# Patient Record
Sex: Male | Born: 1960 | Race: White | Smoking: Never smoker
Health system: Northeastern US, Academic
[De-identification: ages and names within clinical notes are randomized; demographics above are authoritative.]

## PROBLEM LIST (undated history)

## (undated) DIAGNOSIS — C801 Malignant (primary) neoplasm, unspecified: Secondary | ICD-10-CM

## (undated) DIAGNOSIS — F419 Anxiety disorder, unspecified: Secondary | ICD-10-CM

## (undated) DIAGNOSIS — K635 Polyp of colon: Secondary | ICD-10-CM

## (undated) DIAGNOSIS — R972 Elevated prostate specific antigen [PSA]: Secondary | ICD-10-CM

## (undated) DIAGNOSIS — M1990 Unspecified osteoarthritis, unspecified site: Secondary | ICD-10-CM

## (undated) HISTORY — DX: Elevated prostate specific antigen (PSA): R97.20

## (undated) HISTORY — DX: Unspecified osteoarthritis, unspecified site: M19.90

## (undated) HISTORY — PX: KNEE SURGERY: SHX244

## (undated) HISTORY — DX: Anxiety disorder, unspecified: F41.9

## (undated) HISTORY — DX: Malignant (primary) neoplasm, unspecified: C80.1

## (undated) HISTORY — PX: UMBILICAL HERNIA REPAIR: SHX196

## (undated) HISTORY — PX: JOINT REPLACEMENT: SHX530

## (undated) HISTORY — PX: INGUINAL HERNIA REPAIR: SHX194

## (undated) HISTORY — PX: PROSTATE SURGERY: SHX751

## (undated) HISTORY — PX: VASECTOMY: SHX75

## (undated) HISTORY — PX: HERNIA REPAIR: SHX51

---

## 2006-12-10 DIAGNOSIS — M1990 Unspecified osteoarthritis, unspecified site: Secondary | ICD-10-CM | POA: Insufficient documentation

## 2006-12-10 DIAGNOSIS — E785 Hyperlipidemia, unspecified: Secondary | ICD-10-CM | POA: Insufficient documentation

## 2007-04-30 ENCOUNTER — Encounter (INDEPENDENT_AMBULATORY_CARE_PROVIDER_SITE_OTHER): Payer: Self-pay | Admitting: *Deleted

## 2007-04-30 ENCOUNTER — Ambulatory Visit (HOSPITAL_COMMUNITY): Admission: RE | Admit: 2007-04-30 | Discharge: 2007-04-30 | Payer: Self-pay | Admitting: *Deleted

## 2008-06-28 ENCOUNTER — Encounter: Payer: Self-pay | Admitting: Cardiology

## 2010-10-24 NOTE — Op Note (Signed)
NAME:  Jeremiah Weaver, Jeremiah Weaver NO.:  [id number]   MEDICAL RECORD NO.:  [id number]          PATIENT TYPE:  AMB   LOCATION:  DAY                          FACILITY:  [facility]   PHYSICIAN:  Alfonse Ras, MD   DATE OF BIRTH:  [birth year]   DATE OF PROCEDURE:  DATE OF DISCHARGE:                               OPERATIVE REPORT   PREOPERATIVE DIAGNOSIS:  Internal hemorrhoids with bleeding and  prolapse.   POSTOPERATIVE DIAGNOSIS:  Internal hemorrhoids with bleeding and  prolapse.   PROCEDURE:  PPH hemorrhoidectomy with rectopexy.   ANESTHESIA:  General.   DESCRIPTION:  The patient was taken to the operating room and placed in  a supine position.  After general anesthesia was induced using  endotracheal tube, he was placed in prone jackknife position.  Perianal  and rectal prep were undertaken.  This was done with Betadine and  saline.  Anal dilatation was accomplished to 3 fingerbreadths.  All  hemorrhoidal bundles were injected with 0.5 Marcaine.  The internal and  external sphincter muscles were also injected with an additional 20 mL  of 0.5 Marcaine.  Anoscope was inserted and a circumferential  pursestring suture was placed 4-5 cm proximal to the dentate line.  A  PPH stapler was then introduced and the pursestring suture was tied down  around the anvil.  The stapler was closed and held in place for 45  seconds.  The stapler was then fired, released, and removed.  Staple  line was inspected.  There was only one area of minimal amount of  bleeding from the mucosa and the right anterior aspect which was  coagulated.  The remainder staple line was intact.  The hemorrhoidal  bundle was identified on the stapler, incised, and removed.  This was  sent for pathologic evaluation.  A Gelfoam packing was placed in the  rectum.  Sterile dressing was applied.  The patient tolerated the  procedure well and went to PACU in good condition.      Alfonse Ras, MD  Electronically  Signed     KRE/MEDQ  D:  04/30/2007  T:  04/30/2007  Job:  [id number]

## 2010-12-12 ENCOUNTER — Encounter: Payer: Self-pay | Admitting: Primary Care

## 2010-12-12 ENCOUNTER — Ambulatory Visit: Payer: Self-pay | Admitting: Primary Care

## 2010-12-12 VITALS — BP 108/70 | HR 60 | Temp 97.3°F | Wt 172.8 lb

## 2010-12-12 DIAGNOSIS — H938X9 Other specified disorders of ear, unspecified ear: Secondary | ICD-10-CM

## 2010-12-12 NOTE — Patient Instructions (Addendum)
Dr. Ether Griffins- [phone]  Debrox

## 2010-12-14 ENCOUNTER — Ambulatory Visit: Payer: Self-pay | Admitting: Otolaryngology

## 2010-12-14 ENCOUNTER — Encounter: Payer: Self-pay | Admitting: Otolaryngology

## 2010-12-14 VITALS — BP 121/70 | HR 59 | Ht 72.0 in | Wt 170.0 lb

## 2010-12-14 DIAGNOSIS — H6120 Impacted cerumen, unspecified ear: Secondary | ICD-10-CM

## 2010-12-15 NOTE — Progress Notes (Signed)
Subjective:       Timothy Cross is a 50 y.o. male who presents with concerns of muffled hearing.  He presents to the office after being referred by his PCP.  He has been using Debrox for the past few days in the left ear.  Denies otalgia, prior episodes, otorrhea.    History:       has no past medical history on file.   has past surgical history that includes knee surgery and Vasectomy.   reports that he has never smoked. He does not have any smokeless tobacco history on file. He reports that he drinks alcohol.  family history includes Cancer in his brother and mother and Conversion in an other family member.    Allergies:   Review of patient's allergies indicates no known allergies.     Medications:     Current Outpatient Prescriptions   Medication Sig   . carbamide peroxide (DEBROX) 6.5 % otic solution 5 drops 2 times daily           Review of Systems:     Hearing loss.  All other ENT ROS otherwise denied.      Objective:      BP 121/70  Pulse 59  Ht 1.829 m (6')  Wt 77.111 kg (170 lb)  BMI 23.06 kg/m2    General:   Normocephalic, well nourished, with appropriate affect in NAD.  A &O x 3.     Head and Face:  The head and face reveal normal facial symmetry without lesions or scars. The salivary glands are palpated and appear normal without tenderness or mass.    EYES:  Non icteric, normal conjunctiva.  EOMI.   Ears: RIGHT EAR:  Ear pinna is normal in appearance with no scars, lesions or masses. Mastoid bone is non tender. The ear canal is clear.  The tympanic membrane is intact without inflammation or perforation. TM is mobile to pneumatic otoscopy without evidence of middle ear effusion. Using 512 Hz tuning fork AC> BC and normal Weber performed on the bony nasal dorsum.     LEFT EAR:  Ear pinna is normal in appearance with no scars, lesions or masses. Mastoid bone is non tender. The ear canal is impacted with cerumen. This was removed.  After removal there was a thin layer of cerumen that coated the TM.  The tympanic membrane is intact without inflammation or perforation. TM is mobile to pneumatic otoscopy without evidence of middle ear effusion. Using 512 Hz tuning fork AC> BC and normal Weber performed on the bony nasal dorsum.   Nose:    External nose is normal. The nasal mucosa is not inflamed. The nasal septum is generally midline and there is no evidence of septal perforation or hematoma. The inferior turbinates are normal without masses or obstructions. No polyps are visualized. The paranasal sinuses are non tender.   Oral:   ORAL CAVITY: The lips, teeth, tongue and buccal mucosa appear normal without lesions or inflammation.  The uvula and soft palate are normal.  The tonsils are non erythematous or swollen.  Oropharynx is without lesion, inflammation or swelling.   Neck:  There is no asymmetry or cervical lymphadenopathy noted in either anterior or posterior neck chains or supraclavicular fossa bilaterally. There are no evident masses, trachea is midline and the thyroid gland reveals no enlargement, tenderness nor mass effect.        Assessment:       50 y.o. male with left cerumen impaction.  Plan:     Cerumen was removed.  Thin layer of cerumen remains on the TM.  He will use Debrox in the ears for one week with follow up.  It has been a pleasure participating in the care of your patient. Thank you for your referral to [facility].  If you have any questions, please feel free to contact me.    Procedure:   Cerumen removal:    The left ear was approached with otoscope, ear speculum, number 3 and 7 suction. All cerumen was removed and the ear canal was thoroughly examined. Attempts to remove thin layer of cerumen off the TM lead to patient guarding. The patient tolerated the procedure well without incident.

## 2010-12-17 NOTE — Progress Notes (Signed)
CHIEF COMPLAINT: Cerumen    HPI: In terms of cerumen impaction ear feels full worse with water.  Going on for a number of weeks.  No pain.  Never followed up on the hematochezia.  Has had none since initially spoken with.  Medications reviewed and reconciled in the computer today.      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        PHYSICAL EXAM: Vitals:  S1-S2, regular rhythm, no S3, no rub,   Lungs clear.  No masses, tenderness, organomegaly, or bruits on abdominal exam.  No calf tenderness or cords.  No edema.  Cerumen in left ear with decreased hearing right TM unremarkable          ASSESS: Cerumen  Hematochezia    PLAN: Colonoscopy  Followup physical  Attempt at removing cerumen after risks benefits reviewed with water pick unsuccessful we'll set up to see ENT      Follow-up:

## 2010-12-21 ENCOUNTER — Encounter: Payer: Self-pay | Admitting: Otolaryngology

## 2011-02-13 ENCOUNTER — Ambulatory Visit
Admit: 2011-02-13 | Discharge: 2011-02-13 | Disposition: A | Discharge status: Home / Self Care | Payer: Self-pay | Source: Ambulatory Visit | Attending: Primary Care | Admitting: Primary Care

## 2011-02-13 LAB — CBC AND DIFFERENTIAL
Baso # K/uL: 0 10*3/uL (ref 0.0–0.1)
Basophil %: 0.5 % (ref 0.2–1.2)
Eos # K/uL: 0.4 10*3/uL (ref 0.0–0.5)
Eosinophil %: 6.6 % (ref 0.8–7.0)
Hematocrit: 42 % (ref 40–51)
Hemoglobin: 13.7 g/dL (ref 13.7–17.5)
Lymph # K/uL: 1.5 10*3/uL (ref 1.3–3.6)
Lymphocyte %: 21.8 % (ref 21.8–53.1)
MCV: 88 fL (ref 79–92)
Mono # K/uL: 0.6 10*3/uL (ref 0.3–0.8)
Monocyte %: 8.7 % (ref 5.3–12.2)
Neut # K/uL: 4.2 10*3/uL (ref 1.8–5.4)
Platelets: 158 10*3/uL (ref 150–330)
RBC: 4.8 MIL/uL (ref 4.6–6.1)
RDW: 13.2 % (ref 11.6–14.4)
Seg Neut %: 62.4 % (ref 34.0–67.9)
WBC: 6.7 10*3/uL (ref 4.2–9.1)

## 2011-02-13 LAB — BASIC METABOLIC PANEL
Anion Gap: 8 (ref 7–16)
CO2: 27 mmol/L (ref 20–28)
Calcium: 8.8 mg/dL (ref 8.6–10.2)
Chloride: 103 mmol/L (ref 96–108)
Creatinine: 1.02 mg/dL (ref 0.67–1.17)
GFR,Black: >59 *
GFR,Caucasian: >59 *
Glucose: 92 mg/dL (ref 60–99)
Lab: 19 mg/dL (ref 6–20)
Potassium: 3.9 mmol/L (ref 3.3–5.1)
Sodium: 138 mmol/L (ref 133–145)

## 2011-02-13 LAB — LIPID PANEL
Chol/HDL Ratio: 2.7
Cholesterol: 180 mg/dL
HDL: 66 mg/dL
LDL Calculated: 100 mg/dL
Non HDL Cholesterol: 114 mg/dL
Triglycerides: 68 mg/dL

## 2011-02-13 LAB — PSA (EFF.4-2010): PSA (eff. 4-2010): 0.9 ng/mL (ref 0.00–4.00)

## 2011-02-27 ENCOUNTER — Encounter: Payer: Self-pay | Admitting: Gastroenterology

## 2011-02-27 ENCOUNTER — Ambulatory Visit: Payer: Self-pay | Admitting: Primary Care

## 2011-02-27 ENCOUNTER — Encounter: Payer: Self-pay | Admitting: Primary Care

## 2011-02-27 VITALS — BP 120/64 | HR 60 | Ht 73.5 in | Wt 174.0 lb

## 2011-02-27 DIAGNOSIS — E785 Hyperlipidemia, unspecified: Secondary | ICD-10-CM

## 2011-02-27 NOTE — Patient Instructions (Signed)
Dr. Mcmeekin- [phone]

## 2011-02-27 NOTE — H&P (Addendum)
Chief complaint: Elevated cholesterol    HPI: In terms of cholesterol elevation and hypertension there is no chest pain or shortness of breath.  No significant change in weight.  Not exercising regularly.  Trying to watch diet.  Has been going on for greater than a year.  Not getting better.  It is of moderate severity.  Most recent blood work reviewed.  Dysplastic nevus and is unsure if it changed  Past medical history: Allergies ACL tear right knee    Family history: Diabetes no     , hypertension no     , coronary artery disease    no     , cancer yes pancreatic thyroid:    Social history: Married, working, does not exercise regularly, occasionally watches diet, does not smoke, does not drink to excess, does not do drugs, never did intravenous drugs, is in a monogamous relationship, sees the dentist regularly, and wears seatbelts.    Review of systems: No chest pain or dyspnea, no blood or black stools, no urinary symptoms that are new, no stroke or seizure, no constitutional symptoms, no polyuria or polydipsia, no problems with the blood or lymph glands, no significantly new problems with the eyes ears nose throat or skin, no new orthopedic problems, psychologically stable. Medications reviewed and reconciled in computer today.    Physical exam: Weight: As recorded                   Blood pressure: As recorded  Murmur  no        regular rhythm  S1-S2 no S3 no rub PMI nondisplaced.  Jugular venous pressure approximately 7 cm.  No new edema , no calf tenderness, or cords.  Lungs clear to auscultation.  No masses, tenderness, organomegaly or bruits and normal bowel sounds.  Cranial nerves, pronator drift, finger-to-nose, light touch, reflexes all equal normal bilaterally.  Acuity, extraocular movements light reflex, ophthalmologic exam all equal and unchanged from previous and without significant abnormality.  Hearing equal bilaterally TMs normal and canals unremarkable.  Nose unremarkable lymph gland cervical  axillary supraclavicular groin all unremarkable.  Skin unremarkable, feet unremarkable and no hernias.  Pulses equal.  No significant bruits.  Few dysplastic nevi and abdomen  thyroid normal. Mouth and throat unremarkable.  ZOX:WRUEAVWUJ and scrotum were unremarkable, there were no hernias, prostate was normal for age without any nodules suggestive of cancer, rectal was unremarkable. Breasts unremarkable.      EKG was reviewed and was without significant change from prior.    ASSESS: Elevated cholesterol  Dysplastic nevus    PLAN: Health care proxy given now  Ophthalmologist  Colonoscopy scheduled for next month per his choice  He will get it done that bleeding has stopped  Reviewed PSA and the patient wishes to continue it  Patient will call with the type of thyroid cancer his family has had  Testicular self-exam known    warnings on  medications  diet, weight loss and exercise encouraged  check blood work prior to next office visit  follow up later next year with blood work  Dermatology

## 2011-03-20 LAB — HEMOGLOBIN AND HEMATOCRIT, BLOOD: HCT: 44.5

## 2011-04-11 ENCOUNTER — Encounter: Payer: Self-pay | Admitting: Gastroenterology

## 2011-04-11 ENCOUNTER — Ambulatory Visit
Admit: 2011-04-11 | Discharge: 2011-04-11 | Disposition: A | Discharge status: Home / Self Care | Payer: Self-pay | Source: Ambulatory Visit | Attending: Gastroenterology | Admitting: Gastroenterology

## 2011-04-13 LAB — SURGICAL PATHOLOGY

## 2011-04-16 ENCOUNTER — Encounter: Payer: Self-pay | Admitting: Gastroenterology

## 2011-05-29 ENCOUNTER — Telehealth: Payer: Self-pay | Admitting: Primary Care

## 2011-05-29 ENCOUNTER — Encounter: Payer: Self-pay | Admitting: Primary Care

## 2011-05-29 ENCOUNTER — Ambulatory Visit: Payer: Self-pay | Admitting: Primary Care

## 2011-05-29 VITALS — BP 124/78 | HR 72 | Ht 73.0 in | Wt 173.0 lb

## 2011-05-29 DIAGNOSIS — R52 Pain, unspecified: Secondary | ICD-10-CM

## 2011-05-29 DIAGNOSIS — M79651 Pain in right thigh: Secondary | ICD-10-CM

## 2011-05-29 DIAGNOSIS — M25551 Pain in right hip: Secondary | ICD-10-CM

## 2011-06-07 ENCOUNTER — Ambulatory Visit: Payer: Self-pay | Admitting: Orthopedic Surgery

## 2011-11-27 ENCOUNTER — Other Ambulatory Visit: Payer: Self-pay | Admitting: Family Medicine

## 2012-02-13 ENCOUNTER — Other Ambulatory Visit: Payer: Self-pay | Admitting: Gastroenterology

## 2012-02-28 ENCOUNTER — Encounter: Payer: Self-pay | Admitting: Primary Care

## 2012-02-28 ENCOUNTER — Telehealth: Payer: Self-pay | Admitting: Primary Care

## 2012-02-28 ENCOUNTER — Encounter: Payer: Self-pay | Admitting: Gastroenterology

## 2012-02-28 ENCOUNTER — Ambulatory Visit: Payer: Self-pay | Admitting: Primary Care

## 2012-02-28 VITALS — BP 126/64 | HR 68 | Ht 72.5 in | Wt 170.0 lb

## 2012-02-28 DIAGNOSIS — Z0000 Encounter for general adult medical examination without abnormal findings: Secondary | ICD-10-CM

## 2012-02-28 DIAGNOSIS — E785 Hyperlipidemia, unspecified: Secondary | ICD-10-CM

## 2012-02-28 DIAGNOSIS — M25551 Pain in right hip: Secondary | ICD-10-CM

## 2012-02-28 DIAGNOSIS — Z23 Encounter for immunization: Secondary | ICD-10-CM

## 2012-02-28 NOTE — Telephone Encounter (Signed)
Please let pt know xray unchanged from previous which is good

## 2012-02-28 NOTE — H&P (Signed)
Chief complaint:hyperchol    ZOX:WRUEAVWUJ  Years, mild severity doing well        Hip pain got better. See prior.        Most recent notes,recent imaging,recent consults(media),recent bw reviewed   Vitals reviewed, medications reviewed and reconciled,problem list reviewed,allergies reviewed     Past medical history: acl tear right knee, allergies, dysplastic nevi chol elevation        Family history: Diabetes no       hypertension no        coronary artery disease no           cancer thyroid cancer, leukemia, grandfather pancreatic cancer  Father with non melanoma skin cancer  Social history:  Married or with a significant other, working, does not exercise regularly, occasionally watches diet, does not smoke, does not drink to excess, does not do drugs, never did intravenous drugs, is  in a monogamous relationship, sees the dentist regularly, and wears seatbelts.    Review of systems: No chest pain or dyspnea, no blood or black stools, no urinary symptoms that are new, no stroke or seizure, no constitutional symptoms, no polyuria or polydipsia, no problems with the blood or lymph glands, no significantly new problems with the eyes ears nose throat or skin, no new orthopedic problems, psychologically stable. Medications reviewed and reconciled in computer today.  Most recent blood work reviewed   Physical exam: Weight:   Vitals including blood pressure as recorded   Murmur          regular rhythm  S1-S2 no S3 no rub PMI nondisplaced.  Jugular venous pressure approximately 7 cm.  No new edema , no calf tenderness, or cords.  Lungs clear to auscultation.  No masses, tenderness, organomegaly or bruits and normal bowel sounds.  Cranial nerves, pronator drift, finger-to-nose, light touch, reflexes all equal normal bilaterally.  Acuity, extraocular movements light reflex, ophthalmologic exam all equal and unchanged from previous and without significant abnormality.  Hearing equal bilaterally TMs normal and canals  unremarkable.  Nose unremarkable lymph gland cervical axillary supraclavicular groin all unremarkable.  Skin unremarkable, feet unremarkable and no hernias.  Pulses equal.  No significant bruits.  thyroid normal. Mouth and throat unremarkable.   WJX:[id number] and scrotum were unremarkable, there were no hernias, prostate was normal for age without any nodules suggestive of cancer, rectal was unremarkable. Breasts unremarkable. Decreased rom of right hip no pain            EKG was reviewed and was without significant change from prior.    ASSESS:hyperchol        PLAN:Medications were reviewed and no changes were made      Risks of new meds reviewed     TSE Known    Follow up later this year with blood work prior   Continue to work on correct diet, optimal weight and exercise             Dermatology never saw, no changing moles  Eyes the same          Mother and brother with different thyroid cancer   Repeat colonoscopy in 03/2016        Flu shot today       bw including psa  Never went to ortho hip feels better  Discussed remeron versus lorazepam loss of both of his children wife with carcinoid tumor  Re xray right hip, pt never went to ortho as directed previously

## 2012-02-29 NOTE — Telephone Encounter (Signed)
Spoke with pt

## 2012-02-29 NOTE — Telephone Encounter (Signed)
See below

## 2012-02-29 NOTE — Telephone Encounter (Signed)
I called and left a message for the patient to call our office back.

## 2012-03-14 ENCOUNTER — Ambulatory Visit
Admit: 2012-03-14 | Discharge: 2012-03-14 | Disposition: A | Discharge status: Home / Self Care | Payer: Self-pay | Source: Ambulatory Visit | Attending: Primary Care | Admitting: Primary Care

## 2012-03-14 DIAGNOSIS — Z0000 Encounter for general adult medical examination without abnormal findings: Secondary | ICD-10-CM

## 2012-03-14 LAB — BASIC METABOLIC PANEL
Anion Gap: 10 (ref 7–16)
CO2: 28 mmol/L (ref 20–28)
Calcium: 8.9 mg/dL (ref 8.6–10.2)
Chloride: 104 mmol/L (ref 96–108)
Creatinine: 1.05 mg/dL (ref 0.67–1.17)
GFR,Black: 94 *
GFR,Caucasian: 82 *
Glucose: 90 mg/dL (ref 60–99)
Lab: 20 mg/dL (ref 6–20)
Potassium: 3.6 mmol/L (ref 3.3–5.1)
Sodium: 142 mmol/L (ref 133–145)

## 2012-03-14 LAB — CBC AND DIFFERENTIAL
Baso # K/uL: 0.1 10*3/uL (ref 0.0–0.1)
Basophil %: 0.9 % (ref 0.2–1.2)
Eos # K/uL: 0.5 10*3/uL (ref 0.0–0.5)
Eosinophil %: 6.6 % (ref 0.8–7.0)
Hematocrit: 44 % (ref 40–51)
Hemoglobin: 15.1 g/dL (ref 13.7–17.5)
Lymph # K/uL: 2 10*3/uL (ref 1.3–3.6)
Lymphocyte %: 28.4 % (ref 21.8–53.1)
MCV: 86 fL (ref 79–92)
Mono # K/uL: 0.5 10*3/uL (ref 0.3–0.8)
Monocyte %: 7.6 % (ref 5.3–12.2)
Neut # K/uL: 3.9 10*3/uL (ref 1.8–5.4)
Platelets: 173 10*3/uL (ref 150–330)
RBC: 5.1 MIL/uL (ref 4.6–6.1)
RDW: 12.8 % (ref 11.6–14.4)
Seg Neut %: 56.5 % (ref 34.0–67.9)
WBC: 7 10*3/uL (ref 4.2–9.1)

## 2012-03-14 LAB — LIPID PANEL
Chol/HDL Ratio: 2.8
Cholesterol: 193 mg/dL
HDL: 70 mg/dL
LDL Calculated: 109 mg/dL
Non HDL Cholesterol: 123 mg/dL
Triglycerides: 70 mg/dL

## 2012-03-14 LAB — PSA (EFF.4-2010): PSA (eff. 4-2010): 1.28 ng/mL (ref 0.00–4.00)

## 2012-09-16 ENCOUNTER — Ambulatory Visit: Payer: Self-pay | Admitting: Primary Care

## 2012-09-16 ENCOUNTER — Encounter: Payer: Self-pay | Admitting: Primary Care

## 2012-09-16 VITALS — BP 122/70 | HR 56 | Ht 72.0 in | Wt 174.6 lb

## 2012-09-16 DIAGNOSIS — M5410 Radiculopathy, site unspecified: Secondary | ICD-10-CM

## 2012-09-16 NOTE — Progress Notes (Signed)
CHIEF COMPLAINT:radiculopathy    HPI: radiculopathy  Going on for couple weeks buttocks and down back of leg  Course getting better? Waxes and wanes, worst last few days,   Associated symtoms no weakness or numbness  Moderate severity  Felt like someone was pushing with a dull knife off and on  Discomfort and tiongling      Took a few ibuprofen last night, woke up felt better, just light discomfort,  No weakness or numbness in leg, does have lower back pain.      Working out and not brought on by that        Most recent notes,recent imaging,recent consults(media),recent bw reviewed   Vitals reviewed, medications reviewed and reconciled,problem list reviewed,allergies reviewed       PAST MEDICAL HISTORY:osteoarthritis      FAMILY HISTORY: cancer        SOCIAL HISTORY:married or with a significant other                                  not Working                                 Does not smoke                                 Does not drink to excess or do drugs    ROS: no significant change in weight            No excessive fatigue            No new urinary symptoms            No new GI symptoms                     PHYSICAL EXAM: Vitals: including blood pressure as recorded  S1-S2, regular rhythm, no S3, no rub,   Lungs clear.  No masses, tenderness, organomegaly, or bruits on abdominal exam.   no calf tenderness or cords.  No edema. Strength reflexes light touch all equal and normal bilaterally except decreased ankle reflex on left compared to right        ASSESS:radiculopathy                        PLAN:Medications were reviewed and  changes were made  See below  Risks of new medication reviewed   .  Follow up later this year with blood work prior  Continue to work on correct diet, optimal weight and exercise    Follow up in a few weeks to recheck ankle reflex           Aleve 2  Bid prilosec once  About a week, if not significantly better or if weakness or numbness of loss of urine or stool, xray, physical  therapy,

## 2012-09-16 NOTE — Patient Instructions (Signed)
Yoga tape if better    Aleve 2  Twice a day,  prilosec once a day,  About a week, if not significantly better or if weakness or numbness of loss of urine or stool, call, any questions call

## 2012-10-07 ENCOUNTER — Encounter: Payer: Self-pay | Admitting: Primary Care

## 2012-10-07 ENCOUNTER — Ambulatory Visit: Payer: Self-pay | Admitting: Primary Care

## 2012-10-07 VITALS — BP 112/60 | HR 60 | Ht 72.0 in | Wt 172.6 lb

## 2012-10-07 DIAGNOSIS — E785 Hyperlipidemia, unspecified: Secondary | ICD-10-CM

## 2012-10-07 DIAGNOSIS — Z0000 Encounter for general adult medical examination without abnormal findings: Secondary | ICD-10-CM

## 2012-10-07 DIAGNOSIS — Z1159 Encounter for screening for other viral diseases: Secondary | ICD-10-CM

## 2012-10-07 DIAGNOSIS — Z125 Encounter for screening for malignant neoplasm of prostate: Secondary | ICD-10-CM

## 2012-10-07 NOTE — Progress Notes (Signed)
CHIEF COMPLAINT:radicular pain    ZOX:WRUEAVWUJ paindoing significantly better, was moderate, no weakness or numbness        Off aleve off prilosec          Most recent notes,recent imaging,recent consults(media),recent bw reviewed   Vitals reviewed, medications reviewed and reconciled,problem list reviewed,allergies reviewed       PAST MEDICAL HISTORY:borderline elevated chol      FAMILY HISTORY: cancer        SOCIAL HISTORY:married or with a significant other                                   Working                                 Does not smoke                                 Does not drink to excess or do drugs    ROS: no significant change in weight            No excessive fatigue            No new urinary symptoms            No new GI symptoms                     PHYSICAL EXAM: Vitals: including blood pressure as recorded  S1-S2, regular rhythm, no S3, no rub,   Lungs clear.  No masses, tenderness, organomegaly, or bruits on abdominal exam.   no calf tenderness or cords.  No edema.strength reflexes light touch, normal lower extremity bilaterally        ASSESS:radiculopathy                        PLAN:Medications were reviewed and no changes were made      Follow up later this year with blood work prior  Continue to work on correct diet, optimal weight and exercise    Do Yoga dvd, call if not better  Pt wishes to hold on xray    Hep c and psa screening

## 2013-02-20 ENCOUNTER — Telehealth: Payer: Self-pay | Admitting: Primary Care

## 2013-02-20 NOTE — Telephone Encounter (Signed)
Pt stopped in and said that he will be going on a work trip to [place] soon and would like to know what immunizations to get. Per our conversation he is to go to [facility] with his itinerary. Pt was given a list of his immunizations that he has gotten here.

## 2013-02-25 ENCOUNTER — Telehealth: Payer: Self-pay

## 2013-02-26 ENCOUNTER — Ambulatory Visit
Admit: 2013-02-26 | Discharge: 2013-02-26 | Disposition: A | Discharge status: Home / Self Care | Payer: Self-pay | Source: Ambulatory Visit | Attending: Primary Care | Admitting: Primary Care

## 2013-02-26 DIAGNOSIS — Z0000 Encounter for general adult medical examination without abnormal findings: Secondary | ICD-10-CM

## 2013-02-26 DIAGNOSIS — Z125 Encounter for screening for malignant neoplasm of prostate: Secondary | ICD-10-CM

## 2013-02-26 DIAGNOSIS — Z1159 Encounter for screening for other viral diseases: Secondary | ICD-10-CM

## 2013-02-26 DIAGNOSIS — E785 Hyperlipidemia, unspecified: Secondary | ICD-10-CM

## 2013-02-26 LAB — LIPID PANEL
Chol/HDL Ratio: 2.9
Cholesterol: 201 mg/dL — AB
HDL: 70 mg/dL
LDL Calculated: 118 mg/dL
Non HDL Cholesterol: 131 mg/dL
Triglycerides: 64 mg/dL

## 2013-02-26 LAB — CBC AND DIFFERENTIAL
Baso # K/uL: 0 10*3/uL (ref 0.0–0.1)
Basophil %: 0.6 % (ref 0.2–1.2)
Eos # K/uL: 0.5 10*3/uL (ref 0.0–0.5)
Eosinophil %: 9.7 % — ABNORMAL HIGH (ref 0.8–7.0)
Hematocrit: 45 % (ref 40–51)
Hemoglobin: 15 g/dL (ref 13.7–17.5)
Lymph # K/uL: 1.3 10*3/uL (ref 1.3–3.6)
Lymphocyte %: 24.7 % (ref 21.8–53.1)
MCV: 87 fL (ref 79–92)
Mono # K/uL: 0.6 10*3/uL (ref 0.3–0.8)
Monocyte %: 10.3 % (ref 5.3–12.2)
Neut # K/uL: 2.9 10*3/uL (ref 1.8–5.4)
Platelets: 175 10*3/uL (ref 150–330)
RBC: 5.1 MIL/uL (ref 4.6–6.1)
RDW: 13 % (ref 11.6–14.4)
Seg Neut %: 54.7 % (ref 34.0–67.9)
WBC: 5.3 10*3/uL (ref 4.2–9.1)

## 2013-02-26 LAB — BASIC METABOLIC PANEL
Anion Gap: 8 (ref 7–16)
CO2: 31 mmol/L — ABNORMAL HIGH (ref 20–28)
Calcium: 9 mg/dL (ref 8.6–10.2)
Chloride: 102 mmol/L (ref 96–108)
Creatinine: 1.11 mg/dL (ref 0.67–1.17)
GFR,Black: 88 *
GFR,Caucasian: 76 *
Glucose: 99 mg/dL (ref 60–99)
Lab: 18 mg/dL (ref 6–20)
Potassium: 3.9 mmol/L (ref 3.3–5.1)
Sodium: 141 mmol/L (ref 133–145)

## 2013-02-26 LAB — PSA (EFF.4-2010): PSA (eff. 4-2010): 1.41 ng/mL (ref 0.00–4.00)

## 2013-02-27 ENCOUNTER — Encounter: Payer: Self-pay | Admitting: Primary Care

## 2013-02-27 ENCOUNTER — Ambulatory Visit: Payer: Self-pay | Admitting: Primary Care

## 2013-02-27 ENCOUNTER — Telehealth: Payer: Self-pay | Admitting: Primary Care

## 2013-02-27 VITALS — BP 120/64 | HR 68 | Ht 72.0 in | Wt 174.6 lb

## 2013-02-27 DIAGNOSIS — Z86010 Personal history of colonic polyps: Secondary | ICD-10-CM

## 2013-02-27 DIAGNOSIS — Z125 Encounter for screening for malignant neoplasm of prostate: Secondary | ICD-10-CM

## 2013-02-27 DIAGNOSIS — M1990 Unspecified osteoarthritis, unspecified site: Secondary | ICD-10-CM

## 2013-02-27 DIAGNOSIS — E785 Hyperlipidemia, unspecified: Secondary | ICD-10-CM

## 2013-02-27 DIAGNOSIS — S83519A Sprain of anterior cruciate ligament of unspecified knee, initial encounter: Secondary | ICD-10-CM

## 2013-02-27 LAB — HEPATITIS C ANTIBODY: Hep C Ab: NEGATIVE

## 2013-02-27 MED ORDER — AMOXICILLIN-POT CLAVULANATE 875-125 MG PO TABS *I*
1.0000 | ORAL_TABLET | Freq: Two times a day (BID) | ORAL | Status: DC
Start: 2013-02-27 — End: 2014-03-02

## 2013-02-27 NOTE — Telephone Encounter (Signed)
Detailed message was left for pt. Asked pt to call back on Monday to confirm he received message.

## 2013-02-27 NOTE — Telephone Encounter (Signed)
Please let pt know mild arthritis in his spine as expected, large amount in the knee as expected and to follow through on the things we spoke about

## 2013-02-27 NOTE — H&P (Signed)
Chief complaint:hyperchol    GNF:[id number] duration for greater than a year  Severity is moderate  Course is staying the same, not worsening  Associated no recent chf or known cardiomyopathy    Elevated cholesterol duration for greater than a year  Severity is moderate not improving  Course is staying the same  Associated no recent heart attack or angina      Has had cartilage repair of right knee as well as acl repair and loose body body removal  Hip doing about the same      Radiculopathy doing significantly better          Most recent notes,recent imaging,recent consults(media),recent bw reviewed   Vitals reviewed, medications reviewed and reconciled,problem list reviewed,allergies reviewed     Past medical history: acl tear right knee, hip pain, back pain,colonic polyps,hyperlipidemia        Family history: Diabetes no       hypertension no        coronary artery disease no           cancer brother leukemia, mother and brother with different thyroid cancers, pancreatic cancer    Social history:  Married or with a significant other, working, does not exercise regularly, occasionally watches diet, does not smoke, does not drink to excess, does not do drugs, never did intravenous drugs, is  in a monogamous relationship, sees the dentist regularly, and wears seatbelts.    Review of systems: No chest pain or dyspnea, no blood or black stools, no urinary symptoms that are new, no stroke or seizure, no constitutional symptoms, no polyuria or polydipsia, no problems with the blood or lymph glands, no significantly new problems with the eyes ears nose throat or skin, no new orthopedic problems, psychologically stable. Medications reviewed and reconciled in computer today.  Most recent blood work reviewed   Physical exam: Weight:   Vitals including blood pressure as recorded   Murmur   no       regular rhythm  S1-S2 no S3 no rub PMI nondisplaced.  Jugular venous pressure approximately 7 cm.  No new edema , no calf  tenderness, or cords.  Lungs clear to auscultation.  No masses, tenderness, organomegaly or bruits and normal bowel sounds.  Cranial nerves, pronator drift, finger-to-nose, light touch, reflexes all equal normal bilaterally.  Acuity, extraocular movements light reflex, ophthalmologic exam all equal and unchanged from previous and without significant abnormality.  Hearing equal bilaterally TMs normal and canals unremarkable.  Nose unremarkable lymph gland cervical axillary supraclavicular groin all unremarkable.  Skin unremarkable, feet unremarkable and no hernias.  Pulses equal.  No significant bruits.  thyroid normal. Mouth and throat unremarkable.   NGE:[id number] and scrotum were unremarkable, there were no hernias, prostate was normal for age without any nodules suggestive of cancer, rectal was unremarkable. Breasts unremarkable.             EKG was reviewed and was without significant change from prior.    ASSESS:hyperchol        PLAN:Medications were reviewed and no changes were made    TSE Known    Follow up later this year with blood work prior   Continue to work on correct diet, optimal weight and exercise               ls spine  And do dvd right knee xray    2.7 percent  Risk , hold on meds    Already got flu shot  Going to [place],  passport   Colonoscopy 03/2014    Xray  Spine since occasional back discomfort  Await fax from [facility] on the shots  augmentin if URI when away but he will call firtst and got meds for diarrhea from [facility]  Current Outpatient Prescriptions   Medication Sig   . amoxicillin-clavulanate (AUGMENTIN) 875-125 MG per tablet Take 1 tablet by mouth 2 times daily     No current facility-administered medications for this visit.

## 2014-03-02 ENCOUNTER — Ambulatory Visit: Payer: Self-pay | Admitting: Primary Care

## 2014-03-02 ENCOUNTER — Telehealth: Payer: Self-pay | Admitting: Primary Care

## 2014-03-02 ENCOUNTER — Encounter: Payer: Self-pay | Admitting: Primary Care

## 2014-03-02 VITALS — BP 132/68 | HR 68 | Ht 72.0 in | Wt 180.6 lb

## 2014-03-02 DIAGNOSIS — Z125 Encounter for screening for malignant neoplasm of prostate: Secondary | ICD-10-CM

## 2014-03-02 DIAGNOSIS — Z0000 Encounter for general adult medical examination without abnormal findings: Secondary | ICD-10-CM

## 2014-03-02 DIAGNOSIS — N429 Disorder of prostate, unspecified: Secondary | ICD-10-CM

## 2014-03-02 DIAGNOSIS — E7800 Pure hypercholesterolemia, unspecified: Secondary | ICD-10-CM

## 2014-03-02 NOTE — Telephone Encounter (Signed)
Received and entered

## 2014-03-02 NOTE — Progress Notes (Signed)
Chief complaint:hyperchol    HPI:  Elevated cholesterol duration for greater than a year  Severity is moderate not improving  Course is staying the same  Associated no recent periph vascular disease or stroke                Arthritis hip and knee, acl tear over twenty years ago, meniscus on right and acl, 15 years ago left leg meniscus tear  Back doing well, was doing yoga tape and helped    Most recent notes,recent imaging,recent consults(media),recent bw reviewed   Vitals reviewed, medications reviewed and reconciled,problem list reviewed,allergies reviewed     Past medical history: colonic polyps,osteoarthritisw, hyperlipidemia, acl tear and meniscal tear        Family history: Diabetes no       hypertension no        coronary artery disease no           cancer mother colonic polyps, brother mother thyroid surgery leukemia, pancreatic cancer    Social history:  Married or with a significant other, working, does not exercise regularly, occasionally watches diet, does not smoke, does not drink to excess, does not do drugs, never did intravenous drugs, is  in a monogamous relationship, sees the dentist regularly, and wears seatbelts.    Review of systems: No chest pain or dyspnea, no blood or black stools, no urinary symptoms that are new, no stroke or seizure, no constitutional symptoms, no polyuria or polydipsia, no problems with the blood or lymph glands, no significantly new problems with the eyes ears nose throat or skin, no new orthopedic problems, psychologically stable. Medications reviewed and reconciled in computer today.  Most recent blood work reviewed   Physical exam: Weight:   Vitals including blood pressure as recorded   Murmur   no       regular rhythm  S1-S2 no S3 no rub PMI nondisplaced.  Jugular venous pressure approximately 7 cm.  No new edema , no calf tenderness, or cords.  Lungs clear to auscultation.  No masses, tenderness, organomegaly or bruits and normal bowel sounds.  Cranial nerves,  pronator drift, finger-to-nose, light touch, reflexes all equal normal bilaterally.  Acuity, extraocular movements light reflex, ophthalmologic exam all equal and unchanged from previous and without significant abnormality.  Hearing equal bilaterally TMs normal and canals unremarkable.  Nose unremarkable lymph gland cervical axillary supraclavicular groin all unremarkable.  Skin unremarkable, feet unremarkable and no hernias.  Pulses equal.  No significant bruits.  thyroid normal. Mouth and throat unremarkable.   MWU:XLKGMWNUU and scrotum were unremarkable, there were no hernias, prostate with question nodules on the right irregular on right compared to left , rectal was unremarkable. Breasts unremarkable.             EKG was reviewed and was without significant change from prior.    ASSESS:hyperchol  Irregular prostate        PLAN:Medications were reviewed and no changes were made        Risks of new meds reviewed     TSE Known    Follow up this year with blood work prior   Continue to work on correct diet, optimal weight and exercise           White area in back of throat consistent with cyst to ent  bw including psa,per prior discussion    White in throat, just noticed, sometimes gets a feeling in throat  Colonoscopy 10/15     Flu shot at work  Carcinoid tumor wife       Cyst on back comes and goes,no pain or itching    To plastics if enlarges or sooner if pt wishes    To Urology after PSA test    No current outpatient prescriptions on file.     No current facility-administered medications for this visit.

## 2014-03-02 NOTE — Telephone Encounter (Signed)
Please call [facility] and get all immunization records given at that facility and enter into chart

## 2014-03-02 NOTE — H&P (Signed)
Chief complaint:hyperchol    HPI:  Elevated cholesterol duration for greater than a year  Severity is moderate not improving  Course is staying the same  Associated no recent periph vascular disease or stroke                Arthritis hip and knee, acl tear over twenty years ago, meniscus on right and acl, 15 years ago left leg meniscus tear  Back doing well, was doing yoga tape and helped    Most recent notes,recent imaging,recent consults(media),recent bw reviewed   Vitals reviewed, medications reviewed and reconciled,problem list reviewed,allergies reviewed     Past medical history: colonic polyps,osteoarthritisw, hyperlipidemia, acl tear and meniscal tear        Family history: Diabetes no       hypertension no        coronary artery disease no           cancer mother colonic polyps, brother mother thyroid surgery leukemia, pancreatic cancer    Social history:  Married or with a significant other, working, does not exercise regularly, occasionally watches diet, does not smoke, does not drink to excess, does not do drugs, never did intravenous drugs, is  in a monogamous relationship, sees the dentist regularly, and wears seatbelts.    Review of systems: No chest pain or dyspnea, no blood or black stools, no urinary symptoms that are new, no stroke or seizure, no constitutional symptoms, no polyuria or polydipsia, no problems with the blood or lymph glands, no significantly new problems with the eyes ears nose throat or skin, no new orthopedic problems, psychologically stable. Medications reviewed and reconciled in computer today.  Most recent blood work reviewed   Physical exam: Weight:   Vitals including blood pressure as recorded   Murmur   no       regular rhythm  S1-S2 no S3 no rub PMI nondisplaced.  Jugular venous pressure approximately 7 cm.  No new edema , no calf tenderness, or cords.  Lungs clear to auscultation.  No masses, tenderness, organomegaly or bruits and normal bowel sounds.  Cranial nerves,  pronator drift, finger-to-nose, light touch, reflexes all equal normal bilaterally.  Acuity, extraocular movements light reflex, ophthalmologic exam all equal and unchanged from previous and without significant abnormality.  Hearing equal bilaterally TMs normal and canals unremarkable.  Nose unremarkable lymph gland cervical axillary supraclavicular groin all unremarkable.  Skin unremarkable, feet unremarkable and no hernias.  Pulses equal.  No significant bruits.  thyroid normal. Mouth and throat unremarkable.   AVW:UJWJXBJYN and scrotum were unremarkable, there were no hernias, prostate with question nodules on the right irregular on right compared to left , rectal was unremarkable. Breasts unremarkable.             EKG was reviewed and was without significant change from prior.    ASSESS:hyperchol  Irregular prostate        PLAN:Medications were reviewed and no changes were made        Risks of new meds reviewed     TSE Known    Follow up this year with blood work prior   Continue to work on correct diet, optimal weight and exercise           White area in back of throat consistent with cyst to ent  bw including psa,per prior discussion    White in throat, just noticed, sometimes gets a feeling in throat  Colonoscopy 10/15     Flu shot at work  Carcinoid tumor wife       Cyst on back comes and goes,no pain or itching    To plastics if enlarges or sooner if pt wishes    To Urology after PSA test    No current outpatient prescriptions on file.     No current facility-administered medications for this visit.

## 2014-03-02 NOTE — Patient Instructions (Addendum)
Blood test next week  Urologist after that-[phone]- 10/1 at 4:30- [facility] 2nd [facility]  [phone]  Send me the types of thyroid diseases your mother and brother had  Phone plastic surgeon-[phone]  Dr. Norton Pastel- colonoscopy- [phone]- this year- consult- 9/25 at 12:45

## 2014-03-03 ENCOUNTER — Encounter: Payer: Self-pay | Admitting: Primary Care

## 2014-03-08 ENCOUNTER — Ambulatory Visit
Admit: 2014-03-08 | Discharge: 2014-03-08 | Disposition: A | Discharge status: Home / Self Care | Payer: Self-pay | Source: Ambulatory Visit | Attending: Primary Care | Admitting: Primary Care

## 2014-03-08 ENCOUNTER — Telehealth: Payer: Self-pay | Admitting: Primary Care

## 2014-03-08 DIAGNOSIS — E785 Hyperlipidemia, unspecified: Secondary | ICD-10-CM

## 2014-03-08 DIAGNOSIS — N429 Disorder of prostate, unspecified: Secondary | ICD-10-CM

## 2014-03-08 DIAGNOSIS — Z125 Encounter for screening for malignant neoplasm of prostate: Secondary | ICD-10-CM

## 2014-03-08 LAB — CBC AND DIFFERENTIAL
Baso # K/uL: 0.1 10*3/uL (ref 0.0–0.1)
Basophil %: 1.4 %
Eos # K/uL: 0.5 10*3/uL (ref 0.0–0.5)
Eosinophil %: 9.1 %
Hematocrit: 45 % (ref 40–51)
Hemoglobin: 14.3 g/dL (ref 13.7–17.5)
IMM Granulocytes #: 0 10*3/uL (ref 0.0–0.1)
IMM Granulocytes: 0.3 %
Lymph # K/uL: 1.8 10*3/uL (ref 1.3–3.6)
Lymphocyte %: 31.4 %
MCH: 28 pg/cell (ref 26–32)
MCHC: 32 g/dL (ref 32–37)
MCV: 88 fL (ref 79–92)
Mono # K/uL: 0.5 10*3/uL (ref 0.3–0.8)
Monocyte %: 8.6 %
Neut # K/uL: 2.9 10*3/uL (ref 1.8–5.4)
Nucl RBC # K/uL: 0 10*3/uL (ref 0.0–0.0)
Nucl RBC %: 0 /100 WBC (ref 0.0–0.2)
Platelets: 187 10*3/uL (ref 150–330)
RBC: 5.1 MIL/uL (ref 4.6–6.1)
RDW: 12.7 % (ref 11.6–14.4)
Seg Neut %: 49.2 %
WBC: 5.8 10*3/uL (ref 4.2–9.1)

## 2014-03-08 LAB — BASIC METABOLIC PANEL
Anion Gap: 16 (ref 7–16)
CO2: 24 mmol/L (ref 20–28)
Calcium: 9.1 mg/dL (ref 8.6–10.2)
Chloride: 104 mmol/L (ref 96–108)
Creatinine: 1.11 mg/dL (ref 0.67–1.17)
GFR,Black: 87 *
GFR,Caucasian: 75 *
Glucose: 92 mg/dL (ref 60–99)
Lab: 19 mg/dL (ref 6–20)
Potassium: 3.7 mmol/L (ref 3.3–5.1)
Sodium: 144 mmol/L (ref 133–145)

## 2014-03-08 LAB — LIPID PANEL
Chol/HDL Ratio: 3
Cholesterol: 192 mg/dL
HDL: 65 mg/dL
LDL Calculated: 110 mg/dL
Non HDL Cholesterol: 127 mg/dL
Triglycerides: 84 mg/dL

## 2014-03-08 LAB — PSA, TOTAL AND FREE
PSA (eff. 4-2010): 1.97 ng/mL (ref 0.00–4.00)
PSA,FREE (eff. 4-2010): 0.25 ng/mL
PSA,Free %: NOT DETECTED %

## 2014-03-08 NOTE — Telephone Encounter (Signed)
Called and left message for him to call back

## 2014-03-08 NOTE — Telephone Encounter (Addendum)
Risk 3.7 hold on medications for cholesterol, will review psa and pt scheduled to see urology. Will discuss which type of thyroid cancer with pt.               update medical history:    mother - 1 parathyroid removed, not functioning correctly    mother - colon cancer    brother - thyroid cancer    brother (same) - chronic myelogenous leukemia     From patient

## 2014-03-09 NOTE — Telephone Encounter (Signed)
Reviewed with pt, he will get type of thyroid cancer his brother had, rule out medullary thyroid cancer with mother with parathyroid diseases

## 2014-03-11 ENCOUNTER — Ambulatory Visit: Payer: Self-pay | Admitting: Urology

## 2014-03-11 ENCOUNTER — Encounter: Payer: Self-pay | Admitting: Urology

## 2014-03-11 VITALS — BP 115/69 | HR 55 | Temp 96.6°F | Ht 72.0 in | Wt 175.0 lb

## 2014-03-11 DIAGNOSIS — N402 Nodular prostate without lower urinary tract symptoms: Secondary | ICD-10-CM

## 2014-03-11 LAB — POCT SIEMENS URINALYSIS DIPSTICK
Glucose,UA POCT: NEGATIVE
Ketones,UA POCT: NEGATIVE
Kit Lot: 405060
Leukocyte,UA POCT: NEGATIVE
Nitrite,UA POCT: NEGATIVE
pH,UA POCT: 6 (ref 5.0–8.0)

## 2014-03-11 NOTE — Progress Notes (Signed)
This office note has been dictated.

## 2014-03-12 NOTE — Letter (Signed)
PATIENT NAME:  Timothy Cross, Timothy Cross   DATE OF SERVICE:  03/12/2014  MRN:  [id number]  Page 2  [address] [place] [id number]  [id number]     March 11, 2014    Frances Nickels, MD   [address] [place], [place] [address]    RE:   Timothy, Cross   DOB:  1960/12/04  Unit#:  [id number]    Dear Dr. Nicole Kindred:     This is just a brief note on your patient, Timothy Cross, who you thought had an irregularity on his prostate when you examined Timothy Cross.  You have examined Timothy Cross previously and did not feel such an irregularity.  You felt the prostate was somewhat irregular feeling particularly on the right.  Timothy Cross is otherwise quite healthy and a recent lab tests includes a PSA of 1.97 with 25% free.  He does not take any medicines and has no known allergies.  His review of systems is pertinent for having mild hypercholesterolemia and a history of colon polyps that have been treated colonoscopically.  He has arthritic hips and knees.  His review of systems for neurologic, respiratory, other GU, and endocrine diseases are negative.  His prior surgery includes a vasectomy in 1997.  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  This was a few years ago and is obviously an incredible tragedy.  He has no grandchildren.  He has a brother with CML and thyroid cancer and another brother who is healthy.  His father is alive and healthy and his mother is as well although she had colon cancer.  His grandfather died of pancreatic cancer.  He has never been a cigarette smoker and drinks little alcohol.     Today's exam of node-bearing areas, abdomen, external genitalia, phallus, hernia canals, and rectum are normal except for what is probably a small right inguinal hernia.  It is not bothering Timothy Cross now.  He has no rectal masses but his prostate is asymmetrical.  Actually, I think what you were feeling on  the right side is a softer, larger lobe which is probably beginning to enlarge with BPH while the left side of the prostate is smaller like a young man's prostate.  I did not feel either was a terribly suspicious nodule.  Today, we talked about this and I suggested that I probably should examine Timothy Cross again and have scheduled this for about a month from now just to make sure my finger is not alarmed by the current exam.  The asymmetry is obvious.  We also talked for a bit about a possible inguinal hernia and I have warned Timothy Cross that while this certainly does not need to be repaired right now, if he develops signs of bowel obstruction or severe pain, he should seek urgent medical attention.  As it stands now, he will be seeing [place] again in about a month for a repeat exam.  As you know, his most recent PSA, which was done a few days ago, was 1.97 and his creatinine 1.11.  CBC and SMA-12 were otherwise normal.  His dipstick urinalysis today had trace proteinuria and trace hematuria although it had less than 2 red cells per high-power field microscopically, so is probably not real hematuria.     Addendum: He does still have good erectile functioning.     Again, thank  you very much for allowing [place] to be involved in the care of your patient.  We will, of course, keep you informed of all urological follow-up.     Sincerely,           Rexene Alberts, MD    EMM/MODL  DD:  03/11/2014 19:43:28  DT:  03/12/2014 07:53:08  Job #:  [id number]    cc: Rexene Alberts, MD   Frances Nickels, MD   [address] [place], [place] [address]

## 2014-03-23 ENCOUNTER — Telehealth: Payer: Self-pay | Admitting: Gastroenterology

## 2014-03-23 NOTE — Telephone Encounter (Signed)
No answer on home number, no message left for pre procedure phone call.

## 2014-03-24 ENCOUNTER — Encounter: Payer: Self-pay | Admitting: Gastroenterology

## 2014-03-24 ENCOUNTER — Encounter
Disposition: A | Discharge status: Home / Self Care | Payer: Self-pay | Source: Ambulatory Visit | Attending: Gastroenterology

## 2014-03-24 ENCOUNTER — Ambulatory Visit
Admit: 2014-03-24 | Disposition: A | Discharge status: Home / Self Care | Payer: Self-pay | Source: Ambulatory Visit | Attending: Gastroenterology | Admitting: Gastroenterology

## 2014-03-24 HISTORY — DX: Polyp of colon: K63.5

## 2014-03-24 LAB — HM COLONOSCOPY

## 2014-03-24 SURGERY — COLONOSCOPY

## 2014-03-24 MED ORDER — MIDAZOLAM HCL 5 MG/5ML IJ SOLN *I*
INTRAMUSCULAR | Status: AC
Start: 2014-03-24 — End: 2014-03-24
  Filled 2014-03-24: qty 5

## 2014-03-24 MED ORDER — FENTANYL CITRATE 50 MCG/ML IJ SOLN *WRAPPED*
INTRAMUSCULAR | Status: AC
Start: 2014-03-24 — End: 2014-03-24
  Filled 2014-03-24: qty 2

## 2014-03-24 MED ORDER — FENTANYL CITRATE 50 MCG/ML IJ SOLN *WRAPPED*
INTRAMUSCULAR | Status: AC | PRN
Start: 2014-03-24 — End: 2014-03-24
  Administered 2014-03-24: 11:00:00 100 ug via INTRAVENOUS

## 2014-03-24 MED ORDER — MIDAZOLAM HCL 1 MG/ML IJ SOLN *I* WRAPPED
INTRAMUSCULAR | Status: AC | PRN
Start: 2014-03-24 — End: 2014-03-24
  Administered 2014-03-24: 2 mg via INTRAVENOUS
  Administered 2014-03-24: 1 mg via INTRAVENOUS
  Administered 2014-03-24: 2 mg via INTRAVENOUS
  Administered 2014-03-24: 1 mg via INTRAVENOUS
  Administered 2014-03-24: 2 mg via INTRAVENOUS
  Administered 2014-03-24: 1 mg via INTRAVENOUS

## 2014-03-24 MED ORDER — SODIUM CHLORIDE 0.9 % IV SOLN WRAPPED *I*
50.0000 mL/h | Status: DC
Start: 2014-03-24 — End: 2014-03-24
  Administered 2014-03-24 (×2): 50 mL/h via INTRAVENOUS

## 2014-03-24 NOTE — Procedures (Signed)
Colonoscopy Procedure Note   Date of Procedure: 03/24/2014   Referring Physician: Frances Nickels, MD  Primary Physician: Frances Nickels, MD        Attending Physician: Dr. Olen Pel  PA/NP:  Burnell Blanks, PA    Indications: Surveillance for history of colonic polyps  Previous colonoscopy: Yes -- Date: 04/11/11  Medications:Fentanyl 100 mcg IV and Midazolam 9 mg IV were administered incrementally over the course of the procedure to achieve an adequate level of conscious sedation.     Procedure Details: The patient was placed in the left lateral decubitus position and monitored continuously with ECG tracing, pulse oximetry, blood pressure monitoring and direct observations. After anorectal examination was performed, the Olympus PCF-H180 was inserted into the rectum and advanced under direct vision to the cecum, which was identified by  the ileocecal valve and the appendiceal orifice. The procedure was considered more difficult than average. Additional maneuvers used: repositioned patient on back and applied abdominal pressure.    Narrative:  During withdrawal examination, the final quality of the prep was excellent.    A careful inspection was made as the colonoscope was withdrawn, a retroflexed view of the rectum was included; findings and interventions are described below. Appropriate photo documentation wasobtained. The patient  recovered in the the [facility]  PCF-Q180 ([id number])  Insertion Time: 1046  Cecum Time: 1059  Exit Time: 1114  Findings:   4 mm polyp removed from cecal base with cold snare.  5 mm polyp removed at 30-35 cm with cold snare.  Tortuous colon.    Complications: none  Impression:   Multiple polyps as removed above  Recommendations:    Await path, repeat in 3-5 years    Evorn Gong, MD  Procedure Report

## 2014-03-24 NOTE — Preop H&P (Signed)
OUTPATIENT PRE-PROCEDURE H&P    Chief Complaint / Indications for Procedure: Surveillance, Hx of polyps    Past Medical History:     Past Medical History   Diagnosis Date    Colon polyp      Past Surgical History   Procedure Laterality Date    Knee surgery       Knee Surgery Conversion Data     Vasectomy       Surgery Of Male Genitalia Vasectomy Conversion Data      Family History   Problem Relation Age of Onset    Conversion Other      [id number]^Large Intestine Cancer^V16.Kamon.Anger    Cancer Mother     Cancer Brother      History     Social History    Marital Status: Married     Spouse Name: N/A     Number of Children: N/A    Years of Education: N/A     Social History Main Topics    Smoking status: Never Smoker     Smokeless tobacco: None    Alcohol Use: Yes      Comment: 3-4 drinks a week     Drug Use: No    Sexual Activity: None     Other Topics Concern    None     Social History Narrative       Allergies:  No Known Allergies (drug, envir, food or latex)    Medications:  Current Facility-Administered Medications   Medication Dose Route Frequency    sodium chloride 0.9 % IV  50 mL/hr Intravenous Continuous      Filed Vitals:    03/24/14 1035   BP:    Pulse: 68   Temp:    Resp: 15   Height:    Weight:        ROS    Physical Examination:  Head/Nose/Throat:negative  Lungs:Lungs clear  Cardiovascular:normal S1 and S2  Abdomen: abdomen soft, non-tender, nondistended, normal active bowel sounds, no masses or organomegaly      Lab Results: none    Radiology impressions (last 30 days):  No results found.    Currently Active Problems:  Patient Active Problem List   Diagnosis Code    Hyperlipidemia E78.5    Osteoarthritis M19.90    ACL tear S83.519A    Personal history of colonic polyps Z86.010    Prostate nodule without urinary obstruction N40.2          UPDATES TO PATIENT'S CONDITION on the DAY OF SURGERY/PROCEDURE    I. Updates to Patient's Condition (to be completed by a provider privileged to  complete a H&P, following reassessment of the patient by the provider):    Full H&P done today; no updates needed.    II. Procedure Readiness   I have reviewed the patient's H&P and updated condition. By completing and signing this form, I attest that this patient is ready for surgery/procedure.      III. Attestation   I have reviewed the updated information regarding the patient's condition and it is appropriate to proceed with the planned surgery/procedure.    Evorn Gong, MD as of 10:38 AM 03/24/2014

## 2014-03-24 NOTE — Discharge Instructions (Signed)
[facility]  Discharge Instructions For Colonoscopy    03/24/2014    11:27 AM    Findings:Polyps removed    Do not drive, operate heavy machinery, drink alcoholic beverages, make important personal or business decisions, or sign legal documents until the next day.     Instructions:   Return to your usual diet.   Do not take aspirin, Advil, Motrin, or other anti-inflammatory medications for 3 days.   Do not do any heavy work/lifting for 7 days.    Things You May Expect:   There may be a small amount of bright red blood in your stool.   It may be a few days before you have a bowel movement.   You may have cramping, bloating, and a "gas" feeling. These feelings should go away as you pass gas. If you still feel uncomfortable, walking around will help to pass the gas.   You were given medication to help you relax during the test. You may feel "fuzzy" and drowsy. Go home and rest for at least 4-6 hours.    You Should Call Your Doctor For Any of the Following:     Bad stomach pain.    Fever.    Bright red bleeding or clots (This may happen up to 2 weeks after the test).    Dizziness or weakness that gets worse or lasts up to 24 hours.    Pain or redness at the IV site.    If you have a serious problem after hours, Call Dr. Olen Pel [phone] to reach the GI physician on call. If you are unable to reach your doctor, go to the [facility] Emergency Department.    Follow Up Care:  If biopsies were taken during your procedure, we will send you the pathology results within 7-10 days. If you do not receive your pathology results after 10 days please call Dr. Olen Pel [phone].  Report will be sent to your primary doctor.

## 2014-03-25 LAB — SURGICAL PATHOLOGY

## 2014-04-19 ENCOUNTER — Ambulatory Visit: Payer: Self-pay | Admitting: Urology

## 2014-04-19 ENCOUNTER — Encounter: Payer: Self-pay | Admitting: Urology

## 2014-04-19 VITALS — BP 121/59 | HR 65 | Temp 96.4°F | Ht 72.0 in | Wt 180.0 lb

## 2014-04-19 DIAGNOSIS — N402 Nodular prostate without lower urinary tract symptoms: Secondary | ICD-10-CM

## 2014-04-19 LAB — POCT SIEMENS URINALYSIS DIPSTICK
Glucose,UA POCT: NEGATIVE
Ketones,UA POCT: NEGATIVE
Kit Lot: 405060
Leukocyte,UA POCT: NEGATIVE
Nitrite,UA POCT: NEGATIVE
Protein,UA POCT: NEGATIVE mg/dL
pH,UA POCT: 7 (ref 5.0–8.0)

## 2014-04-19 NOTE — Progress Notes (Signed)
This office note has been dictated.

## 2014-04-19 NOTE — Letter (Signed)
PATIENT NAME:  Timothy Cross, Timothy Cross   DATE OF SERVICE:  04/19/2014  MRN:  [id number]  Page 2  [address] [place] [id number]  [id number]     April 19, 2014    Frances Nickels, MD   [place] [place], [place] [address]    RE:   ANTWAIN, CALIENDO   DOB:  11/09/1960  Unit#:  [id number]    Dear Nicole Kindred:     This is just a brief followup on your patient, Timothy Cross, who you felt had an irregular area on his prostate on exam.  Today, he returns feeling well although did develop a hemorrhoid after colonoscopy.  This is getting better although not completely gone.  Today, his dipstick urinalysis is negative except for trace hematuria, but there are less than 2 red cells per high power field.  As you recall, his recall his PSA was 1.97.  On today's exam of node-bearing areas, abdomen, external genitalia, phallus, hernia canals, and back no abnormalities are found except for a small right inguinal hernia.  This was noted before and is not bothering him.     He does have a posterior hemorrhoid on his anal verge which is no longer thrombosed but certainly prominent.  He has no other rectal masses.  In examining his prostate, I still feel that there is probably not a definite nodule there but feel that one side is perhaps a little denser than the other.  There is asymmetry as I mentioned in my previous note with the right side being somewhat softer than the left which is smaller and denser.  I think this is probably beginning BPH on the right side, but at this stage, I do not think it justifies a biopsy.  We will continue to monitor him, seeing him in 6 months, getting lab tests before that visit.     Again, thank you very much for allowing [place] to be involved in the care of your patient.  We will, of course, keep you informed of all urological follow-up.     Sincerely,           Rexene Alberts, MD    EMM/MODL  DD:  04/19/2014 08:25:06  DT:  04/19/2014  13:26:06  Job #:  [id number]    cc: Rexene Alberts, MD   Frances Nickels, MD   [address], [place] [address]

## 2014-10-25 ENCOUNTER — Encounter: Payer: Self-pay | Admitting: Urology

## 2014-10-25 ENCOUNTER — Ambulatory Visit: Payer: Self-pay | Admitting: Urology

## 2014-10-25 VITALS — BP 117/64 | HR 54 | Temp 97.7°F | Ht 72.0 in | Wt 180.0 lb

## 2014-10-25 DIAGNOSIS — N402 Nodular prostate without lower urinary tract symptoms: Secondary | ICD-10-CM

## 2014-10-25 LAB — POCT SIEMENS URINALYSIS DIPSTICK
Blood,UA POCT: NEGATIVE
Glucose,UA POCT: NEGATIVE
Ketones,UA POCT: NEGATIVE
Kit Lot: 511058
Leukocyte,UA POCT: NEGATIVE
Nitrite,UA POCT: NEGATIVE
Protein,UA POCT: NEGATIVE mg/dL
pH,UA POCT: 7.5 (ref 5.0–8.0)

## 2014-10-25 NOTE — Letter (Signed)
PATIENT NAME:  Timothy Cross, Timothy Cross   DATE OF SERVICE:  10/25/2014  MRN:  [id number]  Page 2  [address] [place] [id number]  [id number]     Oct 25, 2014    Frances Nickels, MD   [address] [place], [place] [address]    RE:   [name], [name]   DOB:  [birth year]  Unit#:  [id number]    Dear Nicole Kindred:       This is just a brief followup on your patient, [name], who, as you know, has a minimally irregular area on his prostate gland without any serious voiding symptoms and with no family history, that he is aware of, of prostate cancer.       Today, he returns to see [place] with a negative dipstick urinalysis and no change in his voiding symptoms.  His PSA has not been repeated since his visit in September, when it was 1.97, which had been up from 1.41 a year before.  I thought I had ordered lab tests, but he did not have them done.       Today's exam of node-bearing areas, abdomen, external genitalia, phallus, hernia canals, and rectum is normal.  Again, on his prostate exam, the right side is a little more prominent than the left, while the left one is somewhat denser.  I am not sure this is a terribly abnormal finding and it is similar to what I had appreciated 6 months ago.  At this stage, he is going to get a PSA later in the week and if it is not rising, we will see him again a year from now, getting lab tests before that visit.  I have reminded him to avoid sexual activity and perineal trauma for about 3 days before the lab test is done.  We will contact him if the PSA is much higher than it had been.     Again, thank you very much for allowing [place] to be involved in the care of your patient.  We will, of course, keep you informed of all urological followup.     Sincerely,  ADDENDUM:  As mentioned, he has relatively few voiding symptoms.             Rexene Alberts, MD    EMM/MODL  DD:  10/25/2014 08:13:12  DT:  10/25/2014 11:28:21  Job #:   [id number]    cc:  Frances Nickels, MD   [address] [place], [place] [address]

## 2014-10-25 NOTE — Progress Notes (Signed)
This office note has been dictated.

## 2014-11-01 ENCOUNTER — Ambulatory Visit
Admit: 2014-11-01 | Discharge: 2014-11-01 | Disposition: A | Discharge status: Home / Self Care | Payer: Self-pay | Source: Ambulatory Visit | Attending: Urology | Admitting: Urology

## 2014-11-01 DIAGNOSIS — N402 Nodular prostate without lower urinary tract symptoms: Secondary | ICD-10-CM

## 2014-11-01 LAB — PSA, TOTAL AND FREE
PSA (eff. 4-2010): 2.29 ng/mL (ref 0.00–4.00)
PSA,FREE (eff. 4-2010): 0.21 ng/mL
PSA,Free %: 9 %

## 2014-11-01 LAB — CREATININE, SERUM
Creatinine: 1.13 mg/dL (ref 0.67–1.17)
GFR,Black: 85 *
GFR,Caucasian: 73 *

## 2014-11-01 LAB — TESTOSTERONE: Testosterone: 325 ng/dL (ref 193–740)

## 2015-03-08 ENCOUNTER — Ambulatory Visit: Payer: Self-pay | Admitting: Primary Care

## 2015-03-08 ENCOUNTER — Encounter: Payer: Self-pay | Admitting: Primary Care

## 2015-03-08 VITALS — BP 126/70 | HR 62 | Ht 72.0 in | Wt 180.0 lb

## 2015-03-08 DIAGNOSIS — E7800 Pure hypercholesterolemia, unspecified: Secondary | ICD-10-CM

## 2015-03-08 DIAGNOSIS — Z0000 Encounter for general adult medical examination without abnormal findings: Secondary | ICD-10-CM

## 2015-03-08 NOTE — H&P (Signed)
Chief complaint:hyperchol    HPI  Elevated cholesterol duration for greater than a year  Severity is moderate not improving  Course is staying the same  Associated no recent myalgia or chest pain    Never followed up with ent and said cyst went down,   Cyst on back unchanged      Urology after psa multiple exams , follow up in spring 2017  ent for area in back of throatwhite cyst enver went lesion resolved  Plastic surgeon cyst on back  Colonoscopy two small polyps    Back and legs  Left elbow , now getting better since stopping golf  Most recent notes,recent imaging,recent consults(media),recent bw reviewed   Vitals reviewed, medications reviewed and reconciled,problem list reviewed,allergies reviewed     Past medical history: prostate nodule, colonic polyps, acl tear, osteoarthritis, hperlipidemia        Family history: Diabetes no    hypertension     no    coronary artery disease  no          cancer yes  Mother parathyroid removed, colon cancer, thyroid cancer, cml  Social history:  Married or with a significant other, working, does not exercise regularly, occasionally watches diet, does not smoke, does not drink to excess, does not do drugs, never did intravenous drugs, is  in a monogamous relationship, sees the dentist regularly, and wears seatbelts.    Review of systems: No chest pain or dyspnea, no blood or black stools, no urinary symptoms that are new, no stroke or seizure, no constitutional symptoms, no polyuria or polydipsia, no problems with the blood or lymph glands, no significantly new problems with the eyes ears nose throat or skin, no new orthopedic problems, psychologically stable. No excessive snoring or symptoms of sleep apnea. No significant urinary incontinence.and aware of rx options.   Medications reviewed and reconciled in computer today.  Most recent blood work reviewed   Physical exam: Weight:   Vitals including blood pressure as recorded   Murmur   no       regular rhythm  S1-S2 no S3 no  rub PMI nondisplaced.  Jugular venous pressure approximately 7 cm.  No new edema , no calf tenderness, or cords.  Lungs clear to auscultation.  No masses, tenderness, organomegaly or bruits and normal bowel sounds.  Cranial nerves, pronator drift, finger-to-nose, light touch, reflexes all equal normal bilaterally.  Acuity, extraocular movements light reflex, ophthalmologic exam all equal and unchanged from previous and without significant abnormality.  Hearing equal bilaterally TMs normal and canals unremarkable.  Nose unremarkable lymph gland cervical axillary supraclavicular groin all unremarkable.  Skin unremarkable, feet unremarkable and no hernias.  Pulses equal.  No significant bruits.  thyroid unchanged. Mouth and throat unremarkable.   VOZ:DGUYQIH and rectal deferred per pt requestBreasts unremarkable.     Lesion in mouth and throat are gone        EKG was reviewed and was without significant change from prior.    ASSESS:annual physical  hyperchol        PLAN:Medications were reviewed and no changes were made        Risks of new meds reviewed   Continue claritin at same dose, benefits outweigh risks, rest of meds the same.  TSE Known    Follow up later next year with blood work prior   Continue to work on correct diet, optimal weight and exercise        Pt should see dentist for routine check  No homicidal or suicidal ideation, pt feels slightly depressed and not as motivated but still very functional.  Discussed medication for depression with pt, will try to exercise and does not wish counseling or meds, will call if changes mind  Colonoscopy 03/2019    Plastics discussed wrt to lesion on back and pt wishes to hold  Non smoker  Immunizations reviewed none due now        Carcinoid doing ok, with wife      mvi sheet given pt  Urology 6/17 for irregular prostate                  Pt knows to call urology wrt to psa, minimal change  Pt to call with type of thyroid cancer of brother    Current Outpatient  Prescriptions   Medication Sig    loratadine (CLARITIN) 10 MG tablet Take 10 mg by mouth as needed for Allergies     No current facility-administered medications for this visit.

## 2015-03-08 NOTE — Patient Instructions (Signed)
Call urology with regard to psa blood test to see if they want it checked sooner than May  Find out from brother specific type of thyroid cancer(medullary thyroid cancer)

## 2015-03-23 ENCOUNTER — Encounter: Payer: Self-pay | Admitting: Primary Care

## 2015-03-24 ENCOUNTER — Telehealth: Payer: Self-pay | Admitting: Primary Care

## 2015-03-24 NOTE — Telephone Encounter (Signed)
Called left message to call back, will discuss thyroid cancer screening and form

## 2015-03-25 NOTE — Telephone Encounter (Signed)
Reviewed with pt, recommended screening thyroid US, pt to consider, reviewed rationale and risks and benefits with pap thyroid cancer

## 2015-10-18 ENCOUNTER — Telehealth: Payer: Self-pay

## 2015-10-18 NOTE — Telephone Encounter (Signed)
Left message for patient regarding appointment date change:    November 28, 2015 @ 3:15

## 2015-10-24 ENCOUNTER — Ambulatory Visit: Payer: Self-pay | Admitting: Urology

## 2015-10-28 NOTE — Telephone Encounter (Signed)
Patient called stated he is not available for appointment on 6/19. He will call back to re-schedule at a later date

## 2015-11-28 ENCOUNTER — Ambulatory Visit: Payer: Self-pay | Admitting: Urology

## 2016-02-20 ENCOUNTER — Encounter: Payer: Self-pay | Admitting: Primary Care

## 2016-02-20 ENCOUNTER — Other Ambulatory Visit: Payer: Self-pay | Admitting: Primary Care

## 2016-02-20 DIAGNOSIS — Z0000 Encounter for general adult medical examination without abnormal findings: Secondary | ICD-10-CM

## 2016-03-01 ENCOUNTER — Encounter: Payer: Self-pay | Admitting: Primary Care

## 2016-03-01 ENCOUNTER — Ambulatory Visit: Payer: Self-pay | Admitting: Primary Care

## 2016-03-01 VITALS — BP 108/68 | HR 59 | Ht 72.0 in | Wt 175.0 lb

## 2016-03-01 DIAGNOSIS — M25561 Pain in right knee: Secondary | ICD-10-CM

## 2016-03-01 DIAGNOSIS — M25551 Pain in right hip: Secondary | ICD-10-CM

## 2016-03-01 DIAGNOSIS — M79604 Pain in right leg: Secondary | ICD-10-CM

## 2016-03-01 NOTE — Patient Instructions (Addendum)
Xray of right hip, right femur and right knee  Mass soft tissue right Knee posteriorly MRI with and without contrast   Dr.Carmody for knee mass- [id number]  Ortho hip asap- [id number]  General surgery for [phone]

## 2016-03-01 NOTE — Progress Notes (Signed)
CHIEF COMPLAINT:right hip pain    [id number] hip pain    For over a year, mostly in upper out leg ocassionally down below knee. No numbness in feet, possible weakness, constant every day, , sitting in the car and driving feels it after, if sitting at work will feel it. Not so much notice it when sitting. Sharp pain occasionally in lateral upper hip, feels tightness in hip when abducts leg. When extends right leg on occasion feels it. Feels possible bump when straining against.     Pain in right leg between knee and hip    Pain also in right knee  He is unaware of any masses          Most recent notes,recent imaging,recent consults(media),recent bw reviewed   Vitals reviewed, medications reviewed and reconciled,problem list reviewed,allergies reviewed       PAST MEDICAL HISTORY:acl tear      FAMILY HISTORY: cancer        SOCIAL HISTORY:married or with a significant other                                   Working                                 Does not smoke                                 Does not drink to excess or do drugs    ROS: no significant change in weight            No excessive fatigue            No new urinary symptoms            No new GI symptoms                     PHYSICAL EXAM: Vitals: including blood pressure as recorded  S1-S2, regular rhythm, no S3, no rub,   Lungs clear.  No masses, tenderness, organomegaly, or bruits on abdominal exam.   no calf tenderness or cords.  No edema. Mass behind right knee, strength reflexes light touch equally bilaterally in lower extremities.  No knee tenderness. Hernia on right side      ASSESS:right hip pain  Right leg pain  Right knee pain                        PLAN:Medications were reviewed and no changes were made  Continue claritin at same dose, benefits outweigh risks, rest of meds the same.  Follow up later this year with blood work prior  Continue to work on [employer], optimal weight and exercise    Current Outpatient Prescriptions   Medication Sig     loratadine (CLARITIN) 10 MG tablet Take 10 mg by mouth as needed for Allergies     No current facility-administered medications for this visit.        Xray of right hip, right femur and right knee  Mass soft tissue right Knee posteriorly MRI with and without contrast   Dr.Carmody for knee mass- [id number]  Ortho hip asap- [id number]  General surgery for [phone]

## 2016-03-02 ENCOUNTER — Other Ambulatory Visit
Admission: RE | Admit: 2016-03-02 | Discharge: 2016-03-02 | Disposition: A | Discharge status: Home / Self Care | Payer: Self-pay | Source: Ambulatory Visit | Attending: Primary Care | Admitting: Primary Care

## 2016-03-02 ENCOUNTER — Telehealth: Payer: Self-pay | Admitting: Primary Care

## 2016-03-02 DIAGNOSIS — Z0000 Encounter for general adult medical examination without abnormal findings: Secondary | ICD-10-CM

## 2016-03-02 LAB — BASIC METABOLIC PANEL
Anion Gap: 13 (ref 7–16)
CO2: 27 mmol/L (ref 20–28)
Calcium: 9.1 mg/dL (ref 8.6–10.2)
Chloride: 103 mmol/L (ref 96–108)
Creatinine: 1.15 mg/dL (ref 0.67–1.17)
GFR,Black: 82 *
GFR,Caucasian: 71 *
Glucose: 92 mg/dL (ref 60–99)
Lab: 21 mg/dL — ABNORMAL HIGH (ref 6–20)
Potassium: 3.9 mmol/L (ref 3.3–5.1)
Sodium: 143 mmol/L (ref 133–145)

## 2016-03-02 LAB — CBC AND DIFFERENTIAL
Baso # K/uL: 0.1 10*3/uL (ref 0.0–0.1)
Basophil %: 0.8 %
Eos # K/uL: 0.4 10*3/uL (ref 0.0–0.5)
Eosinophil %: 7.1 %
Hematocrit: 46 % (ref 40–51)
Hemoglobin: 14.9 g/dL (ref 13.7–17.5)
IMM Granulocytes #: 0 10*3/uL (ref 0.0–0.1)
IMM Granulocytes: 0.2 %
Lymph # K/uL: 1.6 10*3/uL (ref 1.3–3.6)
Lymphocyte %: 26.9 %
MCH: 29 pg/cell (ref 26–32)
MCHC: 33 g/dL (ref 32–37)
MCV: 89 fL (ref 79–92)
Mono # K/uL: 0.5 10*3/uL (ref 0.3–0.8)
Monocyte %: 8.7 %
Neut # K/uL: 3.4 10*3/uL (ref 1.8–5.4)
Nucl RBC # K/uL: 0 10*3/uL (ref 0.0–0.0)
Nucl RBC %: 0 /100 WBC (ref 0.0–0.2)
Platelets: 196 10*3/uL (ref 150–330)
RBC: 5.1 MIL/uL (ref 4.6–6.1)
RDW: 12.8 % (ref 11.6–14.4)
Seg Neut %: 56.3 %
WBC: 6 10*3/uL (ref 4.2–9.1)

## 2016-03-02 LAB — LIPID PANEL
Chol/HDL Ratio: 2.7
Cholesterol: 194 mg/dL
HDL: 71 mg/dL
LDL Calculated: 111 mg/dL
Non HDL Cholesterol: 123 mg/dL
Triglycerides: 61 mg/dL

## 2016-03-02 NOTE — Telephone Encounter (Signed)
Please let pt know,xray showed moderate arthritis in both hip and knee as expected

## 2016-03-02 NOTE — Telephone Encounter (Signed)
Pt made aware

## 2016-03-06 ENCOUNTER — Encounter: Payer: Self-pay | Admitting: Surgery

## 2016-03-06 ENCOUNTER — Encounter: Payer: Self-pay | Admitting: Primary Care

## 2016-03-06 ENCOUNTER — Ambulatory Visit: Payer: BLUE CROSS/BLUE SHIELD | Attending: Surgery | Admitting: Surgery

## 2016-03-06 ENCOUNTER — Telehealth: Payer: Self-pay | Admitting: Primary Care

## 2016-03-06 DIAGNOSIS — R2241 Localized swelling, mass and lump, right lower limb: Secondary | ICD-10-CM

## 2016-03-06 DIAGNOSIS — K4020 Bilateral inguinal hernia, without obstruction or gangrene, not specified as recurrent: Secondary | ICD-10-CM | POA: Insufficient documentation

## 2016-03-06 HISTORY — DX: Bilateral inguinal hernia, without obstruction or gangrene, not specified as recurrent: K40.20

## 2016-03-06 NOTE — Telephone Encounter (Signed)
What is the status of this pts mri with and without contrast

## 2016-03-06 NOTE — Student Note (Addendum)
H&P    HPI: Timothy Cross is a 55 y.o. male with pmh of arthritis who presents with new inguinal hernia. Pt states that he has had a right inguinal hernia for a while, and recently noted that the "bulge has gotten bigger" on that side, esp when bearing down. Pt states he saw his PCP who agreed that the hernia has grown in size and referred him for repair. The hernia is reducible, nontender. He denies any symptoms at this time. Pt would prefer to defer repair until after business trip to [place] in two weeks if possible.    ROS: as per HPI    PMHx:  Past Medical History:   Diagnosis Date    Anxiety     Arthritis     Colon polyp        PSHX:  Past Surgical History:   Procedure Laterality Date    KNEE SURGERY      Knee Surgery Conversion Data     VASECTOMY      Surgery Of Male Genitalia Vasectomy Conversion Data        SH:  Pt reports that he has never smoked. He has never used smokeless tobacco. He reports that he drinks alcohol. He reports that he does not use illicit drugs.    FH:  Family History   Problem Relation Age of Onset    Conversion Other      20080701^Large Intestine Cancer^V16.0^Active^    Cancer Mother     Cancer Brother        All:  No Known Allergies (drug, envir, food or latex)    Home Meds:   Current Outpatient Prescriptions on File Prior to Visit   Medication Sig Dispense Refill    loratadine (CLARITIN) 10 MG tablet Take 10 mg by mouth as needed for Allergies       No current facility-administered medications on file prior to visit.        Objective:  Vitals:   Vitals:    03/06/16 0818   BP: 114/65   Pulse: 56   Resp: 16   Temp: 36.1 C (97 F)   Weight: 78.5 kg (173 lb)   Height: 1.829 m (6')     General: Pt found in NAD.  CV: RRR  Resp: Unlabored respirations  Abd: Soft, Non-distended. Non-tender to palpation. Small inguinal hernias bilaterally  Extremities: wwp  Neuro: AAOx3. No focal deficits noted. Normal speech.      ASSESSMENT & PLAN: 55 y.o. male with pmh of arthritis who presents  with bilateral inguinal hernias. We discussed options for management, including observation, laparoscopic or open repair of the hernias. We also discussed the risks of hernia repair, including infection, bleeding, pain, and recurrent hernia. He agreed to laparoscopic repair of all hernias with mesh and informed consent was obtained.     Fredderick Erb   03/06/2016   3:10 PM  [place] [facility] [facility]

## 2016-03-08 NOTE — Telephone Encounter (Signed)
10/2 at 10:45. Pt is aware

## 2016-03-13 ENCOUNTER — Ambulatory Visit: Payer: Self-pay | Admitting: Primary Care

## 2016-03-13 ENCOUNTER — Encounter: Payer: Self-pay | Admitting: Primary Care

## 2016-03-13 VITALS — BP 110/72 | HR 65 | Ht 72.0 in | Wt 173.0 lb

## 2016-03-13 DIAGNOSIS — Z23 Encounter for immunization: Secondary | ICD-10-CM

## 2016-03-13 DIAGNOSIS — Z0000 Encounter for general adult medical examination without abnormal findings: Secondary | ICD-10-CM

## 2016-03-13 DIAGNOSIS — E7800 Pure hypercholesterolemia, unspecified: Secondary | ICD-10-CM

## 2016-03-13 DIAGNOSIS — N429 Disorder of prostate, unspecified: Secondary | ICD-10-CM

## 2016-03-13 NOTE — Patient Instructions (Addendum)
Dr.Carmody for the soft tissue behind the knee, ask her also her recommendations concerning your hip and knee- [phone]  Blood test PSA, , schedule a follow up visit with Dr. Lamar Blinks  Flu shot today  Schedule the hernia repair with surgery(patient has number)  Plastic Surgery phone number for cyst on back- [phone]

## 2016-03-13 NOTE — H&P (Signed)
Chief complaint:hyperchol    HPI:    Elevated cholesterol duration for greater than a year  Severity is moderate not improving  Course is staying the same  Associated no recent myalgia or chest pain  In context of trying to exercise      Had irregular prostate but did not follow throught this year    No knee giving out,   Has gotten shots [facility]  Pt says trying to keep weight around 175, 174 three years ago  Saw urology and then missed appointment in 2017      Most recent notes,recent imaging,recent consults(media),recent bw reviewed   Vitals reviewed, medications reviewed and reconciled,problem list reviewed,allergies reviewed     Past medical history: irregular prostate, colonic polyps, arthritis, hernia, hyperchol, acl tear        Family history: Diabetes no       hypertension no        coronary artery disease no           cancer yes    Social history:  Married or with a significant other, working, does not exercise regularly, occasionally watches diet, does not smoke, does not drink to excess, does not do drugs, never did intravenous drugs, is  in a monogamous relationship, sees the dentist regularly, and wears seatbelts.    Review of systems: No chest pain or dyspnea, no blood or black stools, no urinary symptoms that are new, no stroke or seizure, no constitutional symptoms, no polyuria or polydipsia, no problems with the blood or lymph glands, no significantly new problems with the eyes ears nose throat or skin, no new orthopedic problems, psychologically stable. No excessive snoring or symptoms of sleep apnea. No significant urinary incontinence.and aware of rx options.   Medications reviewed and reconciled in computer today.  Most recent blood work reviewed   Physical exam: Weight:   Vitals including blood pressure as recorded   Murmur   no       regular rhythm  S1-S2 no S3 no rub PMI nondisplaced.  Jugular venous pressure approximately 7 cm.  No new edema , no calf tenderness, or cords.   Lungs clear to auscultation.  No masses, tenderness, organomegaly or bruits and normal bowel sounds.  Cranial nerves, pronator drift, finger-to-nose, light touch, reflexes all equal normal bilaterally.  Acuity, extraocular movements light reflex, ophthalmologic exam all equal and unchanged from previous and without significant abnormality.  Hearing equal bilaterally TMs normal and canals unremarkable.  Nose unremarkable lymph gland cervical axillary supraclavicular groin all unremarkable.  Skin unremarkable, feet unremarkable and no hernias.  Pulses equal.  No significant bruits.  thyroid unchanged. Mouth and throat unremarkable.   [id number] and scrotum were unremarkable, there were no hernias, prostate was normal for age without any nodules suggestive of cancer, rectal was unremarkable. Breasts unremarkable.     Pelvic, rectal and breast were deferred per patient request        EKG was reviewed and  Rate 54    As per ekg reading , rhythm NSR,  Axis normal 1, no significant change from prior        ASSESS:annual physical  Right hip pain  Right knee pain  Soft tissue mass behind knee on right  Irregular prostate  hyperchol  Cyst on back    PLAN:Medications were reviewed and no changes were made            TSE Known    Follow up later this year with blood  work prior   Continue to work on [employer], optimal weight and exercise       Non smoker           Dr. Evelena Asa and ortho follow up from there after discussing with her  Follow up with urology will order psa prior  Colonoscopy 2020  Flu shot  Surgery per hernia per their note  Cyst in back plastic surgery for two years ago, unchanged times years about 2 cm in size              No current outpatient prescriptions on file.     No current facility-administered medications for this visit.    Dr.Carmody for the soft tissue behind the knee, ask her also her recommendations concerning your hip and knee  Blood test PSA, , schedule a follow up visit with Dr.  Lamar Blinks  Flu shot today  Schedule the hernia repair with surgery(patient has number)  Plastic Surgery phone number for cyst on back

## 2016-03-18 NOTE — H&P (Signed)
H&P    HPI: Taejon Rodenberger is a 55 y.o. male with pmh of arthritis who presents with new inguinal hernia. Pt states that he has had a right inguinal hernia for a while, and recently noted that the "bulge has gotten bigger" on that side, esp when bearing down. Pt states he saw his PCP who agreed that the hernia has grown in size and referred him for repair. The hernia is reducible, nontender. He denies any symptoms at this time. Pt would prefer to defer repair until after business trip to [place] in two weeks if possible.    ROS: as per HPI    PMHx:  Past Medical History:   Diagnosis Date    Anxiety     Arthritis     Colon polyp        PSHX:  Past Surgical History:   Procedure Laterality Date    KNEE SURGERY      Knee Surgery Conversion Data     VASECTOMY      Surgery Of Male Genitalia Vasectomy Conversion Data        SH:  Pt reports that he has never smoked. He has never used smokeless tobacco. He reports that he drinks alcohol. He reports that he does not use illicit drugs.    FH:  Family History   Problem Relation Age of Onset    Conversion Other      20080701^Large Intestine Cancer^V16.0^Active^    Cancer Mother     Cancer Brother        All:  No Known Allergies (drug, envir, food or latex)    Home Meds:   No current outpatient prescriptions on file prior to visit.     No current facility-administered medications on file prior to visit.        Objective:  Vitals:   Vitals:    03/06/16 0818   BP: 114/65   Pulse: 56   Resp: 16   Temp: 36.1 C (97 F)   Weight: 78.5 kg (173 lb)   Height: 1.829 m (6')     General: Pt found in NAD.  CV: RRR  Resp: Unlabored respirations  Abd: Soft, Non-distended. Non-tender to palpation. Small inguinal hernias bilaterally  Extremities: wwp  Neuro: AAOx3. No focal deficits noted. Normal speech.      ASSESSMENT & PLAN: 55 y.o. male with pmh of arthritis who presents with bilateral inguinal hernias. We discussed options for management, including observation, laparoscopic or open  repair of the hernias. We also discussed the risks of hernia repair, including infection, bleeding, pain, and recurrent hernia. He agreed to laparoscopic repair of all hernias with mesh and informed consent was obtained.     [place], MD   03/18/2016   9:05 PM  [facility] General [facility]    I saw and evaluated the patient. I agree with the resident's/fellow's findings and plan of care as documented above. Plan for laparosocpic bilateral inguinal hernia repair    Travoris Bushey E Jarrah Seher, MD

## 2016-03-18 NOTE — Student Note (Addendum)
H&P    HPI: Timothy Cross is a 55 y.o. male with pmh of arthritis who presents with new inguinal hernia. Pt states that he has had a right inguinal hernia for a while, and recently noted that the "bulge has gotten bigger" on that side, esp when bearing down. Pt states he saw his PCP who agreed that the hernia has grown in size and referred him for repair. The hernia is reducible, nontender. He denies any symptoms at this time. Pt would prefer to defer repair until after business trip to [place] in two weeks if possible.    ROS: as per HPI    PMHx:  Past Medical History:   Diagnosis Date    Anxiety     Arthritis     Colon polyp        PSHX:  Past Surgical History:   Procedure Laterality Date    KNEE SURGERY      Knee Surgery Conversion Data     VASECTOMY      Surgery Of Male Genitalia Vasectomy Conversion Data        SH:  Pt reports that he has never smoked. He has never used smokeless tobacco. He reports that he drinks alcohol. He reports that he does not use illicit drugs.    FH:  Family History   Problem Relation Age of Onset    Conversion Other      20080701^Large Intestine Cancer^V16.0^Active^    Cancer Mother     Cancer Brother        All:  No Known Allergies (drug, envir, food or latex)    Home Meds:   No current outpatient prescriptions on file prior to visit.     No current facility-administered medications on file prior to visit.        Objective:  Vitals:   Vitals:    03/06/16 0818   BP: 114/65   Pulse: 56   Resp: 16   Temp: 36.1 C (97 F)   Weight: 78.5 kg (173 lb)   Height: 1.829 m (6')     General: Pt found in NAD.  CV: RRR  Resp: Unlabored respirations  Abd: Soft, Non-distended. Non-tender to palpation. Small inguinal hernias bilaterally  Extremities: wwp  Neuro: AAOx3. No focal deficits noted. Normal speech.      ASSESSMENT & PLAN: 55 y.o. male with pmh of arthritis who presents with bilateral inguinal hernias. We discussed options for management, including observation, laparoscopic or open  repair of the hernias. We also discussed the risks of hernia repair, including infection, bleeding, pain, and recurrent hernia. He agreed to laparoscopic repair of all hernias with mesh and informed consent was obtained.     [place], MD   03/18/2016   9:03 PM  [facility] Surgery    I saw and evaluated the patient. I agree with the resident's/fellow's findings and plan of care as documented above. Plan for laparosocpic bilateral inguinal hernia repair    Yuval Nolet E Brylon Brenning, MD

## 2016-03-18 NOTE — Preop H&P (Signed)
AMBULATORY  Chief Complaint:   Chief Complaint   Patient presents with    Advice Only     HERNIA CONS       History of Present Illness:  HPI    Past Medical History:   Diagnosis Date    Anxiety     Arthritis     Colon polyp      Past Surgical History:   Procedure Laterality Date    KNEE SURGERY      Knee Surgery Conversion Data     VASECTOMY      Surgery Of Male Genitalia Vasectomy Conversion Data      Family History   Problem Relation Age of Onset    Conversion Other      [id number]^Large Intestine Cancer^V16.0^Active^    Cancer Mother     Cancer Brother      Social History     Social History    Marital status: Married     Spouse name: N/A    Number of children: N/A    Years of education: N/A     Social History Main Topics    Smoking status: Never Smoker    Smokeless tobacco: Never Used    Alcohol use Yes     1 Glasses of wine, 4 Cans of beer per week      Comment: 3-4 drinks a week     Drug use: No    Sexual activity: Yes     Partners: Female     Patent examiner protection: None      Comment: vasectomy     Other Topics Concern    None     Social History Narrative       Allergies:   No Known Allergies (drug, envir, food or latex)    Medications:  No current outpatient prescriptions on file.     No current facility-administered medications for this visit.         Review of Systems:   Review of Systems   All other systems reviewed and are negative.      Blood pressure 114/65, pulse 56, temperature 36.1 C (97 F), resp. rate 16, height 1.829 m (6'), weight 78.5 kg (173 lb), SpO2 98 %.    Physical Exam   Constitutional: He appears well-developed and well-nourished. No distress.   HENT:   Mouth/Throat: Oropharynx is clear and moist.   Eyes: EOM are normal. Pupils are equal, round, and reactive to light. No scleral icterus.   Cardiovascular: Normal rate and regular rhythm.    Pulmonary/Chest: Effort normal and breath sounds normal. No respiratory distress.   Abdominal: Soft. Bowel sounds are normal. He  exhibits no distension. A hernia is present. Hernia confirmed positive in the right inguinal area and confirmed positive in the left inguinal area.   Nursing note and vitals reviewed.      Lab Results: none    Radiology impressions (last 30 days):  Mr Knee Right Without And With Contrast    Result Date: 03/12/2016  Exam Site: [facility] 03/12/2016 11:43 AM  MR KNEE RT WITHOUT AND WITH CONTRAST  ORDERING CLINICAL INFORMATION:  ERECORD: Mass soft tissue right Knee posteriorly ADDITIONAL CLINICAL INFORMATION:  Right knee pain, with palpable mass posteriorly. History of multiple prior surgeries.  COMPARISON:  Right knee radiographs 02/29/2016, and 02/27/2013.  PROCEDURE:  MRI of the right knee was performed on a high magnetic field strength 3T magnet. Axial and sagittal T2 fat-saturated, and coronal T2 fat saturated sequences were obtained.  Additional T1 fat saturated sequence was obtained prior to contrast demonstration. Following the uneventful IV administration of 17 cc of ProHance, additional axial and sagittal T1 fat saturated sequences were obtained. Palpable abnormality is predicated with vitamin E capsules.  FINDINGS:  Anterior Cruciate Ligament: Patient is status post prior ACL reconstruction, with femoral and tibial interference screws. ACL graft is indistinct, and likely partially re-torn. Posterior Cruciate Ligament: Intact. Medial Collateral Ligament: Intact. Lateral Collateral Ligament: Intact. Popliteus Tendon: Intact. Distal Quadriceps Tendon: Intact. Patellar Tendon:  Intact. Medial and Lateral Patellar Retinacula: Intact. Medial Meniscus: Complex tear with extrusion of the body and posterior horn. Lateral Meniscus: Complex tear with extrusion of the body and posterior horn. Medial Compartment Articular Cartilage: Areas of full-thickness chondral loss with adjacent marrow edema. Large adjacent osteophytes. Lateral Compartment Articular Cartilage:  Areas of full-thickness chondral loss. Large  adjacent osteophytes. Patellofemoral Articular Cartilage: Heterogeneous, with areas of partial-thickness chondral loss over the lateral facet inferiorly. Small osteophytes. Bones: No acute osseous abnormalities. Fluid:  Small joint effusion. Complex Baker's cyst measuring approximately 8 cm in superior to inferior dimension. Other: There are multiple osseous loose bodies in the posterior aspect of the joint, with several osseous loose bodies over the lateral femoral condyle posteriorly, measuring approximately 1.5 cm AP by 3 cm transverse by 2.7 cm craniocaudal. This corresponds to the patient's palpable abnormality.      IMPRESSION: Several osseous loose bodies over the lateral femoral condyle posteriorly, measuring approximately 1.5 cm AP by 3 cm transverse by 2.7 cm craniocaudal. This likely corresponds to the patient's palpable abnormality.  Patient is status post prior ACL reconstruction, with indistinct ACL graft, likely  re-torn.  Areas of full-thickness chondral loss and large adjacent osteophytes in the medial and lateral compartments. Complex tearing with extrusion of the body and posterior horn of both the medial and lateral menisci.  END REPORT  I have personally reviewed the image(s) and the resident's interpretation and agree with or edited the findings.      * Femur Right Standard Ap And Lateral    Result Date: 03/02/2016  Exam Site: [facility] We appreciate the opportunity to see your patient.  03/02/2016 8:40 AM  FEMUR RT 2 VIEWS  ORDERING CLINICAL INFORMATION:  ERECORD: right leg pain ADDITIONAL CLINICAL INFORMATION:  None.   COMPARISON: 05/29/2011  PROCEDURE:  AP and frog leg lateral views of right femur.  FINDINGS/IMPRESSION:  Interference screws from prior ACL reconstruction. There is moderate osteoarthritic change about the right knee with narrowing of the medial and patellofemoral compartments. No effusion. An ossified body posteriorly is consistent with a loose body. A small  ossicle adjacent to the medial femoral condyle may reflect remote injury to the medial collateral ligament.  END REPORT    * Hip Right Ap And Lateral Views    Result Date: 03/02/2016  Exam Site: [facility] We appreciate the opportunity to see your patient.  03/02/2016 8:40 AM  HIP RT MIN 2 VIEWS  ORDERING CLINICAL INFORMATION:  ERECORD: right hip pain ADDITIONAL CLINICAL INFORMATION:  None.   COMPARISON:  02/28/2012  PROCEDURE:  AP and frog leg lateral views of the right hip .  FINDINGS/IMPRESSION:  Moderate osteoarthritic changes are noted about the right hip with joint space narrowing, subchondral sclerosis and osteophytosis. There is no fracture or destructive process. Bone density is maintained.     END REPORT    * Knee Right Standard Ap, Lateral, Patellar Views    Result Date: 03/02/2016  Exam Site:  [facility] We appreciate the opportunity to see your patient.  03/02/2016 8:40 AM  KNEE RT 3 VIEWS  ORDERING CLINICAL INFORMATION:  ERECORD: right knee pain ADDITIONAL CLINICAL INFORMATION:  None.   COMPARISON:  02/23/2015  PROCEDURE:  AP, sunrise, and lateral views of the right knee.  FINDINGS/IMPRESSION:  Interference screws from prior ACL reconstruction. There is tricompartmental moderate osteophytic change with joint space during, osteophytosis and subchondral sclerosis. There is at least one loose body noted along the posterior margin of the knee joint. An ossific density adjacent to the medial femoral condyle may be related to prior injury to the medial collateral ligament. No fracture or destructive process.  END REPORT      Currently Active Problems:  Patient Active Problem List   Diagnosis Code    Hyperlipidemia E78.5    Osteoarthritis M19.90    ACL tear S83.519A    Personal history of colonic polyps Z86.010    Prostate nodule without urinary obstruction N40.2    Inguinal hernia bilateral, non-recurrent K40.20        Assessment: Bilateral inguinal hernias    Plan: I had a  long discussion with the patient about the natural history and pathophysiology of abdominal wall hernias, specifically inguinal hernias. It is apparent that he has a symptomatic left inguinal hernia and an incidentally identified right inguinal hernia. We talked about the treatment options. It is my recommendation that he undergo a bilateral inguinal hernia repair (we also talked about the possibility of just fixing the symptomatic hernia). The options for repair are open and laparoscopic approaches. There is evidence that a laparoscopic approach with bilateral inguinal hernias has a lower recurrence rate and a more rapid recovery when compared to open surgery. We talked about the laparoscopic bilateral inguinal hernia repair and its risks including bleeding, infection, damage to surrounding tissues including nerves and the need for additional procedures including recurrence. I told him that he should expect recovery to full function (including exercise) to take about 2-4 weeks. All of his questions were answered. We will get him scheduled at his earliest convenience.    Author: Suszanne Finch, MD  Note created: 03/18/2016  at: 9:02 PM

## 2016-10-10 ENCOUNTER — Telehealth: Payer: Self-pay | Admitting: Surgery

## 2016-10-10 NOTE — Telephone Encounter (Signed)
DB PRE-OP  HERNIA 10/29/16    Patient has post- op questions regarding traveling and golfing.    Patient has to travel to [place] for work. 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 Also would like to know when can he resume golfing.    Please call [phone]

## 2016-10-10 NOTE — Telephone Encounter (Signed)
Spoke to pt today regarding restrictions s/p surgery scheduled for 10/29/2016   I will follow up with DB next week with specifics

## 2016-10-17 NOTE — Plan of Care (Signed)
Problem: Knowledge deficit related to pre or post-op regimens  Goal: Patient verbalizes understanding of PACU teaching  Outcome: Completed or Resolved Date Met: 10/17/16

## 2016-10-22 ENCOUNTER — Telehealth: Payer: Self-pay | Admitting: Surgery

## 2016-10-22 NOTE — Telephone Encounter (Signed)
Consulted Dr. Quay Burow   Pt okay to travel   Discussed infection prevention  Pt acknowledges instructions  Encouraged ice and and rest as needed per swelling could increase with flight

## 2016-10-22 NOTE — Telephone Encounter (Signed)
DB PRE-OP BILATERAL HERNIA REPAIR    Patient is calling to f/u on any restriction concerns for international travel to [place].    Please cal pt back at [phone]

## 2016-10-29 ENCOUNTER — Ambulatory Visit: Payer: BLUE CROSS/BLUE SHIELD | Admitting: Anesthesiology

## 2016-10-29 ENCOUNTER — Encounter: Payer: Self-pay | Admitting: Anesthesiology

## 2016-10-29 ENCOUNTER — Encounter
Admission: RE | Disposition: A | Discharge status: Home / Self Care | Payer: Self-pay | Source: Ambulatory Visit | Attending: Surgery

## 2016-10-29 ENCOUNTER — Ambulatory Visit
Admission: RE | Admit: 2016-10-29 | Discharge: 2016-10-29 | Disposition: A | Discharge status: Home / Self Care | Payer: BLUE CROSS/BLUE SHIELD | Source: Ambulatory Visit | Attending: Surgery | Admitting: Surgery

## 2016-10-29 ENCOUNTER — Ambulatory Visit: Payer: BLUE CROSS/BLUE SHIELD

## 2016-10-29 DIAGNOSIS — K4020 Bilateral inguinal hernia, without obstruction or gangrene, not specified as recurrent: Secondary | ICD-10-CM

## 2016-10-29 DIAGNOSIS — K429 Umbilical hernia without obstruction or gangrene: Secondary | ICD-10-CM | POA: Insufficient documentation

## 2016-10-29 HISTORY — PX: PR LAP, VENTRAL HERNIA REPAIR,REDUCIBLE: 49652

## 2016-10-29 HISTORY — PX: PR LAPS REPAIR HERNIA EXCEPT INCAL/INGUN REDUCIBLE: 49652

## 2016-10-29 HISTORY — PX: PR LAP,INGUINAL HERNIA REPR,INITIAL: 49650

## 2016-10-29 SURGERY — REPAIR, HERNIA, INGUINAL, LAPAROSCOPIC
Anesthesia: General | Site: Groin | Wound class: Clean

## 2016-10-29 MED ORDER — ROCURONIUM BROMIDE 10 MG/ML IV SOLN *WRAPPED*
Status: AC
Start: 2016-10-29 — End: 2016-10-29
  Filled 2016-10-29: qty 5

## 2016-10-29 MED ORDER — FENTANYL CITRATE 50 MCG/ML IJ SOLN *WRAPPED*
INTRAMUSCULAR | Status: DC | PRN
Start: 2016-10-29 — End: 2016-10-29
  Administered 2016-10-29 (×2): 100 ug via INTRAVENOUS

## 2016-10-29 MED ORDER — ONDANSETRON HCL 2 MG/ML IV SOLN *I*
INTRAMUSCULAR | Status: AC
Start: 2016-10-29 — End: 2016-10-29
  Filled 2016-10-29: qty 2

## 2016-10-29 MED ORDER — FENTANYL CITRATE 50 MCG/ML IJ SOLN *WRAPPED*
INTRAMUSCULAR | Status: AC
Start: 2016-10-29 — End: 2016-10-29
  Filled 2016-10-29: qty 4

## 2016-10-29 MED ORDER — KETOROLAC TROMETHAMINE 30 MG/ML IJ SOLN *I*
INTRAMUSCULAR | Status: DC | PRN
Start: 2016-10-29 — End: 2016-10-29
  Administered 2016-10-29: 30 mg via INTRAVENOUS

## 2016-10-29 MED ORDER — MEPERIDINE HCL 25 MG/ML IJ SOLN *I*
12.5000 mg | INTRAMUSCULAR | Status: DC | PRN
Start: 2016-10-29 — End: 2016-10-29

## 2016-10-29 MED ORDER — EPHEDRINE 5MG/ML IN NS IV/IJ *WRAPPED*
INTRAMUSCULAR | Status: AC
Start: 2016-10-29 — End: 2016-10-29
  Filled 2016-10-29: qty 5

## 2016-10-29 MED ORDER — HALOPERIDOL LACTATE 5 MG/ML IJ SOLN *I*
0.5000 mg | Freq: Once | INTRAMUSCULAR | Status: DC | PRN
Start: 2016-10-29 — End: 2016-10-29

## 2016-10-29 MED ORDER — NEOSTIGMINE METHYLSULFATE 10 MG/10ML IV SOLN *I*
INTRAVENOUS | Status: AC
Start: 2016-10-29 — End: 2016-10-29
  Filled 2016-10-29: qty 3

## 2016-10-29 MED ORDER — PROMETHAZINE HCL 25 MG/ML IJ SOLN *I*
6.2500 mg | Freq: Once | INTRAMUSCULAR | Status: DC | PRN
Start: 2016-10-29 — End: 2016-10-29

## 2016-10-29 MED ORDER — LACTATED RINGERS IV SOLN *I*
20.0000 mL/h | INTRAVENOUS | Status: DC
Start: 2016-10-29 — End: 2016-10-29
  Administered 2016-10-29: 20 mL/h via INTRAVENOUS

## 2016-10-29 MED ORDER — LIDOCAINE HCL 2 % IJ SOLN *I*
INTRAMUSCULAR | Status: DC | PRN
Start: 2016-10-29 — End: 2016-10-29
  Administered 2016-10-29: 80 mg via INTRAVENOUS

## 2016-10-29 MED ORDER — GLYCOPYRROLATE 0.2 MG/ML IJ SOLN *I*
INTRAMUSCULAR | Status: DC | PRN
Start: 2016-10-29 — End: 2016-10-29
  Administered 2016-10-29 (×2): 0.4 mg via INTRAVENOUS

## 2016-10-29 MED ORDER — HYDROMORPHONE HCL PF 0.5 MG/0.5 ML IJ SOLN *I*
0.5000 mg | INTRAMUSCULAR | Status: DC | PRN
Start: 2016-10-29 — End: 2016-10-29

## 2016-10-29 MED ORDER — LACTATED RINGERS IV SOLN *I*
125.0000 mL/h | INTRAVENOUS | Status: DC
Start: 2016-10-29 — End: 2016-10-29

## 2016-10-29 MED ORDER — DEXAMETHASONE SODIUM PHOSPHATE 4 MG/ML INJ SOLN *WRAPPED*
INTRAMUSCULAR | Status: AC
Start: 2016-10-29 — End: 2016-10-29
  Filled 2016-10-29: qty 1

## 2016-10-29 MED ORDER — HEPARIN SODIUM 5000 UNIT/ML SQ *I*
5000.0000 [IU] | Freq: Once | SUBCUTANEOUS | Status: AC
Start: 2016-10-29 — End: 2016-10-29
  Administered 2016-10-29: 5000 [IU] via SUBCUTANEOUS
  Filled 2016-10-29: qty 1

## 2016-10-29 MED ORDER — MIDAZOLAM HCL 1 MG/ML IJ SOLN *I* WRAPPED
INTRAMUSCULAR | Status: AC
Start: 2016-10-29 — End: 2016-10-29
  Filled 2016-10-29: qty 2

## 2016-10-29 MED ORDER — PROPOFOL 10 MG/ML IV EMUL (INTERMITTENT DOSING) WRAPPED *I*
INTRAVENOUS | Status: DC | PRN
Start: 2016-10-29 — End: 2016-10-29
  Administered 2016-10-29: 50 mg via INTRAVENOUS
  Administered 2016-10-29: 150 mg via INTRAVENOUS

## 2016-10-29 MED ORDER — BUPIVACAINE-EPINEPHRINE 0.25 % IJ SOLUTION *WRAPPED*
INTRAMUSCULAR | Status: AC
Start: 2016-10-29 — End: 2016-10-29
  Filled 2016-10-29: qty 50

## 2016-10-29 MED ORDER — OXYCODONE-ACETAMINOPHEN 5-325 MG PO TABS *I*
1.0000 | ORAL_TABLET | ORAL | Status: DC | PRN
Start: 2016-10-29 — End: 2016-10-30
  Administered 2016-10-29: 1 via ORAL
  Filled 2016-10-29: qty 1

## 2016-10-29 MED ORDER — IBUPROFEN 600 MG PO TABS *I*
600.0000 mg | ORAL_TABLET | Freq: Four times a day (QID) | ORAL | Status: DC | PRN
Start: 2016-10-29 — End: 2016-10-30

## 2016-10-29 MED ORDER — OXYCODONE-ACETAMINOPHEN 5-325 MG PO TABS *I*
1.0000 | ORAL_TABLET | ORAL | 0 refills | Status: DC | PRN
Start: 2016-10-29 — End: 2017-03-22

## 2016-10-29 MED ORDER — EPHEDRINE 5MG/ML IN NS IV/IJ *WRAPPED*
INTRAMUSCULAR | Status: DC | PRN
Start: 2016-10-29 — End: 2016-10-29
  Administered 2016-10-29: 10 mg via INTRAVENOUS

## 2016-10-29 MED ORDER — MIDAZOLAM HCL 1 MG/ML IJ SOLN *I* WRAPPED
INTRAMUSCULAR | Status: DC | PRN
Start: 2016-10-29 — End: 2016-10-29
  Administered 2016-10-29: 2 mg via INTRAVENOUS

## 2016-10-29 MED ORDER — OXYCODONE-ACETAMINOPHEN 5-325 MG PO TABS *I*
2.0000 | ORAL_TABLET | ORAL | Status: DC | PRN
Start: 2016-10-29 — End: 2016-10-30

## 2016-10-29 MED ORDER — LIDOCAINE HCL 2 % (PF) IJ SOLN *I*
INTRAMUSCULAR | Status: AC
Start: 2016-10-29 — End: 2016-10-29
  Filled 2016-10-29: qty 5

## 2016-10-29 MED ORDER — CEFAZOLIN 2000 MG IN STERILE WATER 20ML SYRINGE *I*
2000.0000 mg | PREFILLED_SYRINGE | INTRAVENOUS | Status: AC
Start: 2016-10-29 — End: 2016-10-29
  Administered 2016-10-29: 2000 mg via INTRAVENOUS
  Filled 2016-10-29: qty 20

## 2016-10-29 MED ORDER — PROPOFOL 10 MG/ML IV EMUL (INTERMITTENT DOSING) WRAPPED *I*
INTRAVENOUS | Status: AC
Start: 2016-10-29 — End: 2016-10-29
  Filled 2016-10-29: qty 20

## 2016-10-29 MED ORDER — LIDOCAINE HCL 1 % IJ SOLN *I*
0.1000 mL | INTRAMUSCULAR | Status: DC | PRN
Start: 2016-10-29 — End: 2016-10-29
  Administered 2016-10-29: 0.1 mL via SUBCUTANEOUS
  Filled 2016-10-29: qty 2

## 2016-10-29 MED ORDER — ROCURONIUM BROMIDE 10 MG/ML IV SOLN *WRAPPED*
Status: DC | PRN
Start: 2016-10-29 — End: 2016-10-29
  Administered 2016-10-29: 50 mg via INTRAVENOUS

## 2016-10-29 MED ORDER — GLYCOPYRROLATE 0.2 MG/ML IJ SOLN *WRAPPED*
INTRAMUSCULAR | Status: AC
Start: 2016-10-29 — End: 2016-10-29
  Filled 2016-10-29: qty 2

## 2016-10-29 MED ORDER — DEXAMETHASONE SODIUM PHOSPHATE 4 MG/ML INJ SOLN *WRAPPED*
INTRAMUSCULAR | Status: DC | PRN
Start: 2016-10-29 — End: 2016-10-29
  Administered 2016-10-29: 4 mg via INTRAVENOUS

## 2016-10-29 MED ORDER — ALBUTEROL SULFATE (2.5 MG/3ML) 0.083% IN NEBU *I*
2.5000 mg | INHALATION_SOLUTION | Freq: Once | RESPIRATORY_TRACT | Status: DC | PRN
Start: 2016-10-29 — End: 2016-10-29

## 2016-10-29 MED ORDER — NEOSTIGMINE METHYLSULFATE 10 MG/10ML IV SOLN *I*
INTRAVENOUS | Status: DC | PRN
Start: 2016-10-29 — End: 2016-10-29
  Administered 2016-10-29: 3 mg via INTRAVENOUS

## 2016-10-29 MED ORDER — ONDANSETRON HCL 2 MG/ML IV SOLN *I*
INTRAMUSCULAR | Status: DC | PRN
Start: 2016-10-29 — End: 2016-10-29
  Administered 2016-10-29: 4 mg via INTRAMUSCULAR

## 2016-10-29 MED ORDER — SODIUM CHLORIDE 0.9 % IV SOLN WRAPPED *I*
20.0000 mL/h | Status: DC
Start: 2016-10-29 — End: 2016-10-29

## 2016-10-29 MED ORDER — BUPIVACAINE-EPINEPHRINE 0.25 % IJ SOLUTION *WRAPPED*
INTRAMUSCULAR | Status: DC | PRN
Start: 2016-10-29 — End: 2016-10-29
  Administered 2016-10-29: 8 mL via SUBCUTANEOUS

## 2016-10-29 SURGICAL SUPPLY — 40 items
APPLICATOR CHLORAPREP 26ML ORANGE LARGE (Solution) ×4 IMPLANT
APPLIER LIGACLIP 20 5MM (Supply) IMPLANT
BANDAGE CURITY ADH XL 2 X 3.75IN STER (Dressing) ×4 IMPLANT
BLADE CLIPPER SURG (Supply) ×1
BLADE SUR CLIPPER W37.2MM CUT H0.23MM GEN PURP EXISTING CLP HNDL DISP (Supply) ×2 IMPLANT
BLADE SUR W37.2MM CUT H0.23MM GEN PURP EXISTING CLP HNDL DISP (Supply) ×2 IMPLANT
BLANKET LOWER BODY TEMP THERAPY (Drape) IMPLANT
BLANKET UPPER BODY TEMP THERAPY (Drape) ×3 IMPLANT
DEVICE FIX L37CM PEEK SMOOTH CANN 30 ABSRB FAST FOR LAP HERN REP CAPSUR (Supply) ×1 IMPLANT
DEVICE FIX TACKER ABSORBABLE 5MM (Supply) IMPLANT
DISSECTOR BLN XTR VIEW OVAL (Supply) ×4 IMPLANT
DRAIN PENROSE 18 X .25IN STER LTX (Supply) IMPLANT
ENDO CLOSE USSC-TROCAR SITE CL (Supply) ×4 IMPLANT
FILTER NEPTUNE 4PORT MANIFOLD (Supply) ×1 IMPLANT
GLOVE SURG PROTEXIS PI 7.0 PF SYN (Glove) ×24 IMPLANT
GLOVES PROTEXIS ORTHO SURGICAL SZ 7.5 (Other) ×4 IMPLANT
HANDPIECE S/I 5MM X 32CM CANNULA DISP (Supply) IMPLANT
MESH HERN M W8.5XL13.7CM L INGUINAL WHT POLYPR MFIL NONABSORBABLE LAP 3D MAX (Implant) ×3 IMPLANT
MESH HERN M W8.5XL13.7CM R INGUINAL WHT POLYPR MFIL NONABSORBABLE LAP 3D MAX (Implant) ×3 IMPLANT
PACK CUSTOM GENERAL ENDOSCOPY (Pack) ×4 IMPLANT
SLEEVE COMP KNEE HI MED (Supply) ×4 IMPLANT
SLEEVE XCEL ENDOPATH 5 X 100MM (Other) ×4 IMPLANT
SOL H2O IRRIG STER 1000ML BTL (Solution) ×1
SOL SOD CHL IV .9PCT 1000ML BAG (Drug) IMPLANT
SOL WATER IRRIG STERILE 1000ML BTL (Solution) ×3 IMPLANT
SPONGE LAPAROTOMY W18XL18IN 7IN LOOP WHITE COTTON 4 PLY RADIOPAQUE PREWASHED DELINTED (Sponge) ×2 IMPLANT
SPONGE SURGICEL ABS 4 X 8IN (Sponge) ×3 IMPLANT
SPONGE XRAY DETACHABLE 18X18 (Sponge) ×2
SUTR MONOCRYL PLUS 4-0 18PS2 (Suture) ×8 IMPLANT
SUTR O VICRYL (Suture) ×2
SUTR VICRYL 0 CT-1 POP 18 IN (Suture) IMPLANT
SUTR VICRYL ANITIB 3-0 SH 27 VIOLET (Suture) IMPLANT
SUTR VICRYL ANTIB 2-0 UR-6 27 VIOLET (Suture) ×8 IMPLANT
SUTURE COAT VCRL + 0 18IN ABSRB VLT ANTIBACT CR 26MM CT-2 1/2 CIR TAPR W/ IRGACARE MP (Suture) ×2 IMPLANT
TRAY FOLEY SIL DRAIN BAG SECURE 14 FR (Other) ×4 IMPLANT
TROCAR XCEL ENDOPATH BLADELESS 12X100MM (Other) ×4 IMPLANT
TROCAR XCEL ENDOPATH BLADELESS 5X100MM (Other) ×4 IMPLANT
TROCAR XCEL ENDOPATH BLADELESS 5X75MM (Other) ×2 IMPLANT
TUBING INSUFFLATON F UHI (Tubing) ×4 IMPLANT
TUBING INSUFLATION FOR CO2 (Tubing) IMPLANT

## 2016-10-29 NOTE — H&P (Signed)
UPDATES TO PATIENT'S CONDITION on the DAY OF SURGERY/PROCEDURE    I. Updates to Patient's Condition (to be completed by a provider privileged to complete a H&P, following reassessment of the patient by the provider):    Day of Surgery/Procedure Update:  History  History reviewed and no change    Physical  Physical exam updated and no change            II. Procedure Readiness   I have reviewed the patient's H&P and updated condition. By completing and signing this form, I attest that this patient is ready for surgery/procedure.    III. Attestation   I have reviewed the updated information regarding the patient's condition and it is appropriate to proceed with the planned surgery/procedure.    Chabeli Barsamian E Kairen Hallinan, MD as of 7:57 AM 10/29/2016

## 2016-10-29 NOTE — Discharge Instructions (Signed)
[facility] Discharge Instructions    DATE: 10/29/2016   Procedure: Right and left laparoscopic inguinal hernia and umbilical hernia repair    Physician: Suszanne Finch, MD    You have received sedative medication and/or general anesthesia which may make you drowsy for as long as 24 hours.     A.) DO NOT drive or operate any machinery for 24 hours    B.) DO NOT drink alcoholic beverages for 24 hours    C.) DO NOT make major decisions, sign contracts, etc. for 24 hours      DIET:  Resume your previous diet   ACTIVITY:  Use ice pack. Walking is encouraged. Climb stairs as tolerated. No heavy lifiting (nothing heavier than 20 pounds) for 4 weeks or until cleared by surgeon.      CARE OF DRESSING OR INCISION:  Keep incision dry. Your incisions were covered with a liquid dressing. These will scab and peel off on their own.    BATHING/SHOWERING  May shower. No tub baths or swimming for at least 2 weeks. Pat incision dry after shower. Do not scrub incision.    FOLLOW-UP CARE  Call for an appointment with Dr. Quay Burow at [phone] if you do not have a follow up appointment scheduled in two weeks    PAIN:  You may have peen prescribed percocet.  This medication contains tylenol.  Do not take any additional tylenol while taking your narcotic pain medication.  Major side effects include constipation.  It will benefit you to take Colace, Senna which are over the counter stool softeners. You should wean yourself from this pain medication as you are able.  You may ibuprofen while taking your narcotic pain medication.  If you take additional tylenol please do not exceed 3000mg  of Acetaminophen over a 24 hour period    Call your doctor for the following problems: FEVER over 101 F/38.4 C; PAIN not relieved with pain medication ordered; SWELLING, REDNESS at incision site, heavy BLEEDING, foul DRAINAGE, INABILITY TO URINATE.     If you are unable to contact your doctor please call or go to [facility] Emergency  Department [phone]  If you have any problems in regards to your anesthesia please call (days) [phone], have the operator page an anesthesiologist on call.

## 2016-10-29 NOTE — Anesthesia Case Conclusion (Signed)
CASE CONCLUSION  Emergence  Actions:  LMA removed  Criteria Used for Airway Removal:  Adequate Tv & RR and acceptable O2 saturation  Assessment:  Routine  Transport  Directly to: [facility]  Position:  Recumbent  Patient Condition on Handoff  Level of Consciousness:  Alert/talking/calm  Patient Condition:  Stable  Handoff Report to:  RN

## 2016-10-29 NOTE — Anesthesia Procedure Notes (Signed)
---------------------------------------------------------------------------------------------------------------------------------------    AIRWAY   GENERAL INFORMATION AND STAFF    Patient location during procedure: OR       Date of Procedure: 10/29/2016 2:11 PM  CONDITION PRIOR TO MANIPULATION     Current Airway/Neck Condition:  Normal        For more airway physical exam details, see Anesthesia PreOp Evaluation  AIRWAY METHOD     Patient Position:  Sniffing    Preoxygenated: yes      Induction: IV  Mask Difficulty Assessment:  0 - not attempted      Technique Used for Successful ETT Placement:  Direct laryngoscopy    Blade Type:  Macintosh    Laryngoscope Blade/Video laryngoscope Blade Size:  3    Cormack-Lehane Classification:  Grade I - full view of glottis    Placement Verified by: capnometry      Number of Attempts at Approach:  1  FINAL AIRWAY DETAILS    Final Airway Type:  Endotracheal airway    Final Endotracheal Airway:  ETT      Cuffed: cuffed    Insertion Site:  Oral    ETT Size (mm):  7.5  ----------------------------------------------------------------------------------------------------------------------------------------

## 2016-10-29 NOTE — Anesthesia Preprocedure Evaluation (Addendum)
Anesthesia Pre-operative History and Physical for Timothy Cross  History and Physical Performed at [facility] ([place])      CPM Summary:  Timothy Cross presents preoperatively for anesthesia evaluation prior to bilateral inguinal and umbilical hernia repairs, no personal or FHX of anesthesia problems, has answered no to all peri op ekg screening questions. He has a history of hip OA, and low back pain.  By Johnella Moloney, PA at 12:09 PM on 10/29/2016    Anesthesia Evaluation Information Source: patient     ANESTHESIA HISTORY  Pertinent(-):  No History of anesthetic complications or Family hx of anesthetic complications    GENERAL  Pertinent (-):  No substance abuse    HEENT  Pertinent (-):  No neck pain PULMONARY  Pertinent(-):  No shortness of breath, recent URI or cough/congestion    CARDIOVASCULAR  Good(4+METs) Exercise Tolerance  Pertinent(-):  No orthopnea    Comment: Does yard wok, can climb one flight of stairs without SOB    GI/HEPATIC/RENAL  Last PO Intake: >8hr before procedure and >2hr before procedure (clears)  Pertinent(-):  No nausea, vomiting, bowel issues or urinary issues NEURO/PSYCH  Pertinent(-):  No dizziness/motion sickness, syncope or seizures    Comment: Intermittent low Back Pain        HEMATOLOGIC    + Arthritis          hips  Pertinent(-):  No bruising/bleeding easily       Physical Exam    Airway            Mouth opening: normal            Mallampati: I            TM distance (fb): >3 FB            Neck ROM: full            Facial hair: mustache, beard  Dental   Normal Exam   Cardiovascular           Rhythm: regular           Rate: normal  No carotid bruit        General Survey    No rashes   Pulmonary     breath sounds clear to auscultation    No rhonchi, decreased breath sounds, wheezes, rales    Mental Status     oriented to person, place and time       ________________________________________________________________________  PLAN  ASA Score  1  Anesthetic Plan general     Induction  (routine IV) General Anesthesia/Sedation Maintenance Plan (inhaled agents);  Airway Manipulation (direct laryngoscopy); Airway (cuffed ETT); Line ( use current access); Monitoring (standard ASA); Positioning (supine and arms out); PONV Plan (dexamethasone and ondansetron); Pain (per surgical team and intraop local); PostOp (PACU)    Informed Consent     Risks:          Risks discussed were commensurate with the plan listed above with the following specific points: N/V, aspiration and sore throat , damage to:(eyes, nerves, teeth), unexpected serious injury, allergic Rx, awareness    Anesthetic Consent:         Anesthetic plan (and risks as noted above) were discussed with patient and spouse    Plan also discussed with team members including:       surgeon    Attending Attestation:  As the primary attending anesthesiologist, I attest that the patient or proxy understands and accepts the risks and benefits of  the anesthesia plan. I also attest that I have personally performed a pre-anesthetic examination and evaluation, and prescribed the anesthetic plan for this particular location within 48 hours prior to the anesthetic as documented. Claudette Stapler, MD 2:18 PM

## 2016-10-29 NOTE — INTERIM OP NOTE (Signed)
Interim Op Note (Surgical Log ID: [id number])       Date of Surgery: 10/29/2016       Surgeons: Surgeon(s) and Role:     * Burns, Virl Diamond, MD - Primary     * Rod Holler, MD - Resident - Assisting       Pre-op Diagnosis: Pre-Op Diagnosis Codes:     * Bilateral inguinal hernia [J50.09]     * Umbilical hernia [F81.8]       Post-op Diagnosis: Post-Op Diagnosis Codes:     * Bilateral inguinal hernia [E99.37]     * Umbilical hernia [J69.6]       Procedure(s) Performed: Procedure:     LAPAROSCOPIC BILATERAL  INGUINAL HERNIA REPAIR with mesh  CPT(R) Code:  78938 - PR LAP,INGUINAL HERNIA REPR,INITIAL    Procedure:     UMBILICAL HERNIA REPAIR  CPT(R) Code:  10175 - PR LAP, VENTRAL HERNIA REPAIR,REDUCIBLE         Additional CPT Codes:        Anesthesia Type: General        Fluid Totals: I/O this shift:  05/21 1500 - 05/21 2259  In: - (0 mL/kg)   Out: 205 (2.6 mL/kg) [Urine:200; Blood:5]  Net: -205  Weight: 79.4 kg        Estimated Blood Loss: Blood Loss: 5 mL       Specimens to Pathology:  None       Temporary Implants: None       Packing: None       Patient Condition: good       Findings (Including unexpected complications): None     Signed:  Rod Holler, MD  on 10/29/2016 at 3:20 PM

## 2016-10-29 NOTE — Anesthesia Postprocedure Evaluation (Signed)
Anesthesia Post-Op Note    Patient: Timothy Cross    Procedure(s) Performed:  Procedure Summary     Date Anesthesia Start Anesthesia Stop Room / Location    10/29/16 1351 [id number] / [facility] OR       Procedure Diagnosis Surgeon Attending Anesthesia     LAPAROSCOPIC BILATERAL  INGUINAL HERNIA REPAIR with mesh (Bilateral Groin);  UMBILICAL HERNIA REPAIR (N/A Abdomen) Bilateral inguinal hernia; Umbilical hernia  (Bilateral Inguinal Hernia, and Umbilical Hernia ) Quay Burow, Virl Diamond, MD Claudette Stapler, MD        Recovery Vitals  BP: 135/77 (10/29/2016  3:14 PM)  Heart Rate: 62 (10/29/2016  3:14 PM)  Resp: 16 (10/29/2016  3:14 PM)  Temp: 36.3 C (97.3 F) (10/29/2016  3:14 PM)  SpO2: 98 % (10/29/2016  3:14 PM)   0-10 Scale: 0 (10/29/2016  3:14 PM)  Anesthesia type:  General  Complications Noted During Procedure or in PACU:  None   Comment:    Patient Location:  ASC  Level of Consciousness:    Recovered to baseline  Patient Participation:     Able to participate  Temperature Status:    Normothermic  Oxygen Saturation:    Within patient's normal range  Cardiac Status:   Within patient's normal range  Fluid Status:    Stable  Airway Patency:     Yes  Pulmonary Status:    Baseline  Pain Management:    Adequate analgesia  Nausea and Vomiting:  None    Post Op Assessment:    Tolerated procedure well and no evidence of recall   Attending Attestation:  All indicated post anesthesia care provided     -

## 2016-10-30 NOTE — Op Note (Signed)
Operative Note (Surgical Log ID: [id number])       Date of Surgery: 10/29/2016       Surgeons: Surgeon(s) and Role:     * Prisca Gearing, Virl Diamond, MD - Primary     * Rod Holler, MD - Resident - Assisting       Pre-op Diagnosis: Pre-Op Diagnosis Codes:     * Bilateral inguinal hernia [Q59.56]     * Umbilical hernia [L87.5]       Post-op Diagnosis: Post-Op Diagnosis Codes:     * Bilateral inguinal hernia [I43.32]     * Umbilical hernia [R51.8]       Procedure(s) Performed: Procedure:     LAPAROSCOPIC BILATERAL  INGUINAL HERNIA REPAIR with mesh  CPT(R) Code:  84166 - PR LAP,INGUINAL HERNIA REPR,INITIAL    Procedure:     UMBILICAL HERNIA REPAIR  CPT(R) Code:  06301 - PR LAP, VENTRAL HERNIA REPAIR,REDUCIBLE         Additional CPT Codes:        Anesthesia Type: General        Fluid Totals: I/O this shift:  05/22 0700 - 05/22 1459  In: 1300 (16.4 mL/kg) [P.O.:400; I.V.:900]  Out: 205 (2.6 mL/kg) [Urine:200; Blood:5]  Net: 1095  Weight: 79.4 kg        Estimated Blood Loss: Blood Loss: 5 mL       Specimens to Pathology:  * No specimens in log *       Temporary Implants:        Packing:                 Patient Condition: good       Indications: Bilateral inguinal hernias       Findings (Including unexpected complications): same     Description of Procedure: Herniorrhaphy Procedure Note    The patient was seen in the Holding Room. The risks, benefits, complications, treatment options, and expected outcomes were discussed with the patient. The possibilities of reaction to medication, pain, infection, bleeding, heart attack, death, injury to internal organs, recurrence, testicular damage, infertility, or need for further surgery were discussed with the patient. The patient concurred with the proposed plan, giving informed consent.  The site of surgery properly noted/marked. The patient was taken to Operating Room, identified as Timothy Cross and the procedure verified as laparoscopic bilateral inguinal and umbilical hernia repair.  A Time Out was held and the above information confirmed.  After the induction of adequate anesthesia, the abdomen was prepped and draped in the usual sterile fashion. An infraumbilical skin incision was made and carried to the external oblique fascia. An small incision in the fascia exposed the rectus muscle which was retracted laterally. The dissection balloon was introduced into the preperitoneal space and advanced to the pubis. The balloon was distended with air to a volume of 1000 cc. The air was evacuated and the balloon removed. The laparoscope was introduced after distending the dissected cavity with C02 gas. 5 mm ports were introduced under direct visualization. With blunt dissection the hernia sac was reduced and the spermatic cord skeletonized. 3d max mesh was introduced into the preperitoneal space.  The mesh lay in good position. Nerves and vessels were protected. No sutures or staples were placed in the femoral vessels.  Hemostasis was obtained. Gas was evacuated, trocars removed and the fascial defects closed. Sterile dressings were applied. At the end of the operation, all sponge, instruments, and needle counts were correct.  Findings:  bilateral direct and umbilical hernia    Estimated Blood Loss:  Minimal           Drains: none           Total IV Fluids: 400 ml           Specimens: none           Implants: 3 d max mesh           Complications:  None; patient tolerated the procedure well.           Disposition: PACU - hemodynamically stable.           Condition: stable    Attending Attestation: I was present and scrubbed for the entire procedure.    Signed:  Dorthy Hustead Ciro Backer, MD  on 10/30/2016 at 12:55 PM

## 2016-10-31 ENCOUNTER — Encounter: Payer: Self-pay | Admitting: Surgery

## 2016-11-01 ENCOUNTER — Telehealth: Payer: Self-pay | Admitting: Surgery

## 2016-11-01 ENCOUNTER — Encounter: Payer: Self-pay | Admitting: Surgery

## 2016-11-01 NOTE — Telephone Encounter (Signed)
DB S/P BILATERAL ING HERNIA    Patient called to request a RTW date; States he prefers not to wait until his post op visit on 6/5 and would like to return 11/06/16.    Please call patient at [phone]

## 2016-11-13 ENCOUNTER — Encounter: Payer: Self-pay | Admitting: Surgery

## 2016-11-13 ENCOUNTER — Ambulatory Visit: Payer: BLUE CROSS/BLUE SHIELD | Attending: Surgery | Admitting: Surgery

## 2016-11-13 ENCOUNTER — Telehealth: Payer: Self-pay | Admitting: Surgery

## 2016-11-13 VITALS — BP 119/71 | HR 68 | Temp 96.3°F | Resp 18

## 2016-11-13 DIAGNOSIS — K4020 Bilateral inguinal hernia, without obstruction or gangrene, not specified as recurrent: Secondary | ICD-10-CM

## 2016-11-13 NOTE — Telephone Encounter (Signed)
DB- Inguinal hernia bilateral, non-recurrent    Patient called to see if he is still on 20 lbs lift restriction, he forgot to ask  Please call him at [phone]

## 2016-11-13 NOTE — Telephone Encounter (Signed)
Per Dr. Quay Burow okay to resume normal activity with out restrictions   Pt has been notified

## 2016-11-25 NOTE — Progress Notes (Signed)
Subjective:       Timothy Cross presents to the clinic 2 weeks following la[aroscopic bilteral inguinal ehrni arepair. Eating a regular diet without difficulty. Bowel movements are Normal.  The patient is not having any pain..      Objective:      BP 119/71   Pulse 68   Temp 35.7 C (96.3 F)   Resp 18   SpO2 99%    General:  alert   Abdomen: soft, bowel sounds active, non-tender   Incision:   healing well, no drainage, no erythema, no hernia, no seroma, no swelling, no dehiscence, incision well approximated       Assessment:      Doing well postoperatively.      Plan:      1. Continue any current medications.  2. Wound care discussed.  3. Pt is to increase activities as tolerated.  4. Follow up: prn

## 2017-01-08 DIAGNOSIS — E7800 Pure hypercholesterolemia, unspecified: Secondary | ICD-10-CM | POA: Diagnosis not present

## 2017-01-08 DIAGNOSIS — Z0000 Encounter for general adult medical examination without abnormal findings: Secondary | ICD-10-CM | POA: Diagnosis not present

## 2017-02-07 ENCOUNTER — Ambulatory Visit: Payer: BLUE CROSS/BLUE SHIELD | Admitting: Cardiology

## 2017-02-08 ENCOUNTER — Other Ambulatory Visit: Payer: Self-pay | Admitting: Primary Care

## 2017-02-08 DIAGNOSIS — E7800 Pure hypercholesterolemia, unspecified: Secondary | ICD-10-CM

## 2017-03-15 ENCOUNTER — Encounter: Payer: Self-pay | Admitting: Primary Care

## 2017-03-15 ENCOUNTER — Other Ambulatory Visit
Admission: RE | Admit: 2017-03-15 | Discharge: 2017-03-15 | Disposition: A | Discharge status: Home / Self Care | Payer: BLUE CROSS/BLUE SHIELD | Source: Ambulatory Visit | Attending: Primary Care | Admitting: Primary Care

## 2017-03-15 DIAGNOSIS — N429 Disorder of prostate, unspecified: Secondary | ICD-10-CM

## 2017-03-15 DIAGNOSIS — E7800 Pure hypercholesterolemia, unspecified: Secondary | ICD-10-CM

## 2017-03-15 LAB — CBC AND DIFFERENTIAL
Baso # K/uL: 0.1 10*3/uL (ref 0.0–0.1)
Basophil %: 1 %
Eos # K/uL: 0.2 10*3/uL (ref 0.0–0.5)
Eosinophil %: 3.6 %
Hematocrit: 43 % (ref 40–51)
Hemoglobin: 14.2 g/dL (ref 13.7–17.5)
IMM Granulocytes #: 0 10*3/uL (ref 0.0–0.1)
IMM Granulocytes: 0.3 %
Lymph # K/uL: 1.4 10*3/uL (ref 1.3–3.6)
Lymphocyte %: 23.2 %
MCH: 29 pg/cell (ref 26–32)
MCHC: 33 g/dL (ref 32–37)
MCV: 88 fL (ref 79–92)
Mono # K/uL: 0.6 10*3/uL (ref 0.3–0.8)
Monocyte %: 9.2 %
Neut # K/uL: 3.8 10*3/uL (ref 1.8–5.4)
Nucl RBC # K/uL: 0 10*3/uL (ref 0.0–0.0)
Nucl RBC %: 0 /100 WBC (ref 0.0–0.2)
Platelets: 190 10*3/uL (ref 150–330)
RBC: 4.9 MIL/uL (ref 4.6–6.1)
RDW: 12.7 % (ref 11.6–14.4)
Seg Neut %: 62.7 %
WBC: 6.1 10*3/uL (ref 4.2–9.1)

## 2017-03-15 LAB — PSA, TOTAL AND FREE
PSA (eff. 4-2010): 3.61 ng/mL (ref 0.00–4.00)
PSA,FREE (eff. 4-2010): 0.3 ng/mL
PSA,Free %: 8 %

## 2017-03-15 LAB — BASIC METABOLIC PANEL
Anion Gap: 17 — ABNORMAL HIGH (ref 7–16)
CO2: 22 mmol/L (ref 20–28)
Calcium: 9.2 mg/dL (ref 8.6–10.2)
Chloride: 107 mmol/L (ref 96–108)
Creatinine: 1.13 mg/dL (ref 0.67–1.17)
GFR,Black: 83 *
GFR,Caucasian: 72 *
Glucose: 92 mg/dL (ref 60–99)
Lab: 23 mg/dL — ABNORMAL HIGH (ref 6–20)
Potassium: 3.8 mmol/L (ref 3.3–5.1)
Sodium: 146 mmol/L — ABNORMAL HIGH (ref 133–145)

## 2017-03-15 LAB — LIPID PANEL
Chol/HDL Ratio: 3.2
Cholesterol: 186 mg/dL
HDL: 58 mg/dL
LDL Calculated: 115 mg/dL
Non HDL Cholesterol: 128 mg/dL
Triglycerides: 66 mg/dL

## 2017-03-21 ENCOUNTER — Encounter: Payer: BLUE CROSS/BLUE SHIELD | Admitting: Primary Care

## 2017-03-21 ENCOUNTER — Encounter: Payer: Self-pay | Admitting: Primary Care

## 2017-03-22 ENCOUNTER — Encounter: Payer: Self-pay | Admitting: Primary Care

## 2017-03-22 ENCOUNTER — Encounter: Payer: Self-pay | Admitting: Gastroenterology

## 2017-03-22 ENCOUNTER — Ambulatory Visit: Payer: BLUE CROSS/BLUE SHIELD | Attending: Primary Care | Admitting: Primary Care

## 2017-03-22 VITALS — BP 102/68 | HR 60 | Wt 175.2 lb

## 2017-03-22 DIAGNOSIS — E7800 Pure hypercholesterolemia, unspecified: Secondary | ICD-10-CM

## 2017-03-22 DIAGNOSIS — Z0000 Encounter for general adult medical examination without abnormal findings: Secondary | ICD-10-CM

## 2017-03-22 DIAGNOSIS — M25551 Pain in right hip: Secondary | ICD-10-CM

## 2017-03-22 DIAGNOSIS — M25561 Pain in right knee: Secondary | ICD-10-CM

## 2017-03-22 DIAGNOSIS — M25562 Pain in left knee: Secondary | ICD-10-CM

## 2017-03-22 NOTE — H&P (Signed)
Chief complaint:right hip pain    [facility]:CWCBJ hip pain for months, getting worst recently    Pain in knees for months, staying about the same    DR.Carmody never went  Never did psa or saw Dr. Lamar Blinks    Right hip pain for over a year, progressing atp  Inguinal hernia bilateral and umbilical hernia  Never went for cyst on back with plastic    Most recent notes,recent imaging,recent consults(media),recent bw reviewed   Vitals reviewed, medications reviewed and reconciled,problem list reviewed,allergies reviewed         Elevated cholesterol duration for greater than a year  Severity is moderate not improving  Course is staying the same  Associated no recent myalgia or chest pain  In context of trying to exercise        Past medical history: acl tear, hyperlipidemia, inguinal hernia, osteoarthritis, colonic polyps, prostate nodule        Family history: Diabetes no       hypertension no        coronary artery disease no           cancer leukemia, thyroid and parathyroid    Social history:  Married or with a significant other, working, does not exercise regularly, occasionally watches diet, does not smoke, does not drink to excess, does not do drugs, never did intravenous drugs, is  in a monogamous relationship, sees the dentist regularly, and wears seatbelts.    Review of systems: No chest pain or dyspnea, no blood or black stools, no urinary symptoms that are new, no stroke or seizure, no constitutional symptoms, no polyuria or polydipsia, no problems with the blood or lymph glands, no significantly new problems with the eyes ears nose throat or skin, no new orthopedic problems, psychologically stable. No excessive snoring or symptoms of sleep apnea. No significant urinary incontinence.and aware of rx options.   Medications reviewed and reconciled in computer today.  Most recent blood work reviewed   Physical exam: Weight:   Vitals including blood pressure as recorded   Murmur   no       regular rhythm  S1-S2 no S3 no  rub PMI nondisplaced.  Jugular venous pressure approximately 7 cm.  No new edema , no calf tenderness, or cords.  Lungs clear to auscultation.  No masses, tenderness, organomegaly or bruits and normal bowel sounds.  Cranial nerves, pronator drift, finger-to-nose, light touch, reflexes all equal normal bilaterally.  Acuity, extraocular movements light reflex, ophthalmologic exam all equal and unchanged from previous and without significant abnormality.  Hearing equal bilaterally TMs normal and canals unremarkable.  Nose unremarkable lymph gland cervical axillary supraclavicular groin all unremarkable.  Skin unremarkable, feet unremarkable and no hernias.  Pulses equal.  No significant bruits.  thyroid unchanged. Mouth and throat unremarkable.   SEG:BTDVVOHYW and scrotum were unremarkable, there were no hernias, prostate slightly irregular for age without any nodules suggestive of cancer, rectal was unremarkable. Breasts unremarkable.     Small mass under knee mobile and non tender      EKG was reviewed and  Rate 50    As per ekg reading , rhythm NSR,  Axis normal 12, no significant change from prior        ASSESS:annual physical  Left knee and right knee  Left hip  Elevated psa    hyperchol  PLAN:Medications were reviewed and no changes were made        Risks of new meds reviewed     TSE  Known   Follow up later this year  Continue to work on correct diet, optimal weight and exercise       To urology for rising psa told risk by calculator of 25% of high grade cancer  Non smoker   Genetics   colonoscopy 2020     Gets flu shot at work  [employer] given    Plastic handout      Cholesterol risk     3.4% hold on meds        Reviewed risk of cancer at length with pt, did not keep follow up last year for blood test and urology.    No current outpatient prescriptions on file.     No current facility-administered medications for this visit.

## 2017-03-22 NOTE — Patient Instructions (Addendum)
Urology for elevated PSA Dr. Lamar Blinks  Shingrix vaccine and repeat in 2-6 months  Genetics for increased risk of cancer with family hx of thyroid,parathyroid and pancreatic cancer [phone]  Xray of both knees and right hip  Dr. Evelena Asa for mass behind knee [phone]  Ortho for hip and knee pain [phone]  Plastic Surgery phone number [phone]  List

## 2017-03-26 ENCOUNTER — Telehealth: Payer: Self-pay | Admitting: Primary Care

## 2017-03-26 NOTE — Telephone Encounter (Signed)
Please encourage pt to get xrays done soon, let him know the orders will be coming out of the computer next week.     Also see if he needs help with ortho, plastic, Dr.Carmody or setting up genetics

## 2017-03-28 DIAGNOSIS — H5213 Myopia, bilateral: Secondary | ICD-10-CM | POA: Diagnosis not present

## 2017-03-28 NOTE — Telephone Encounter (Signed)
Lmtcb.

## 2017-03-29 ENCOUNTER — Telehealth: Payer: Self-pay | Admitting: Primary Care

## 2017-03-29 NOTE — Telephone Encounter (Signed)
Pt called back(lisa had left a message) I relayed message to pt, he said thank you

## 2017-04-01 ENCOUNTER — Ambulatory Visit
Admission: RE | Admit: 2017-04-01 | Discharge: 2017-04-01 | Disposition: A | Discharge status: Home / Self Care | Payer: BLUE CROSS/BLUE SHIELD | Source: Ambulatory Visit

## 2017-04-01 ENCOUNTER — Telehealth: Payer: Self-pay | Admitting: Primary Care

## 2017-04-01 DIAGNOSIS — M25562 Pain in left knee: Secondary | ICD-10-CM

## 2017-04-01 DIAGNOSIS — M25462 Effusion, left knee: Secondary | ICD-10-CM

## 2017-04-01 DIAGNOSIS — M25461 Effusion, right knee: Secondary | ICD-10-CM

## 2017-04-01 DIAGNOSIS — M25551 Pain in right hip: Secondary | ICD-10-CM

## 2017-04-01 DIAGNOSIS — M25561 Pain in right knee: Secondary | ICD-10-CM

## 2017-04-01 DIAGNOSIS — M170 Bilateral primary osteoarthritis of knee: Secondary | ICD-10-CM

## 2017-04-01 DIAGNOSIS — M2341 Loose body in knee, right knee: Secondary | ICD-10-CM

## 2017-04-01 DIAGNOSIS — M222X2 Patellofemoral disorders, left knee: Secondary | ICD-10-CM

## 2017-04-01 DIAGNOSIS — M1611 Unilateral primary osteoarthritis, right hip: Secondary | ICD-10-CM

## 2017-04-01 NOTE — Telephone Encounter (Signed)
Patient called back and message was given. No questions.

## 2017-04-01 NOTE — Telephone Encounter (Addendum)
Let pt know a lot of arthritis in hip and knees with progression since last xray. If he wishes to help scheduling ortho or all the other visits in his discharge note to let us know.

## 2017-04-01 NOTE — Telephone Encounter (Signed)
Patient did not receive message. He wanted to get message from my chart.

## 2017-04-04 ENCOUNTER — Ambulatory Visit: Payer: BLUE CROSS/BLUE SHIELD | Admitting: Orthopedic Surgery

## 2017-04-05 ENCOUNTER — Telehealth: Payer: Self-pay | Admitting: Primary Care

## 2017-04-05 NOTE — Telephone Encounter (Signed)
Please let pt know that I filled it out for PE examination date of 03/13/2016 and blood test from 03/02/2016. Make sure that is what he wants. Please scan in copy and give original to pt as he wishes to receive it.

## 2017-04-05 NOTE — Telephone Encounter (Signed)
Spoke with pt and pt thinks that should be fine. Pt will pick it up on monday

## 2017-04-08 ENCOUNTER — Telehealth: Payer: Self-pay | Admitting: Primary Care

## 2017-04-08 ENCOUNTER — Ambulatory Visit: Payer: BLUE CROSS/BLUE SHIELD | Attending: Primary Care | Admitting: Urology

## 2017-04-08 ENCOUNTER — Encounter: Payer: Self-pay | Admitting: Urology

## 2017-04-08 VITALS — BP 123/58 | HR 54 | Temp 98.1°F | Ht 72.0 in | Wt 175.0 lb

## 2017-04-08 DIAGNOSIS — N402 Nodular prostate without lower urinary tract symptoms: Secondary | ICD-10-CM

## 2017-04-08 DIAGNOSIS — R972 Elevated prostate specific antigen [PSA]: Secondary | ICD-10-CM | POA: Insufficient documentation

## 2017-04-08 LAB — POCT SIEMENS URINALYSIS DIPSTICK
Blood,UA POCT: NEGATIVE
Glucose,UA POCT: NEGATIVE mg/dL
Ketones,UA POCT: NEGATIVE mg/dL
Kit Lot: 802052
Leukocyte,UA POCT: NEGATIVE
Nitrite,UA POCT: NEGATIVE
Protein,UA POCT: NEGATIVE mg/dL
pH,UA POCT: 7.5 (ref 5.0–8.0)

## 2017-04-08 NOTE — Telephone Encounter (Signed)
Height per nurse same as last year

## 2017-04-08 NOTE — Telephone Encounter (Signed)
Please copy form and let pt know available

## 2017-04-08 NOTE — Progress Notes (Signed)
This office note has been dictated.

## 2017-04-08 NOTE — Letter (Signed)
PATIENT NAME:  [name], [name]   DATE OF SERVICE:  04/08/2017  MRN:  [id number]  CSN:  [id number]  Page 2  [address] [place], [id number]  [id number]     April 08, 2017    Frances Nickels, MD   [address] [place], [place] [address]   Fax Number:  [phone]    RE:   [name], [name]   DOB:  1961/05/30  Unit#:  [id number]  CSN:  [id number]    Dear Nicole Kindred:     This is just a brief followup on your patient, Timothy Cross, who I have actually not seen for 2-1/2 years.  To make a long story short, I originally saw him because of a minimally lumpy prostate and a fairly low PSA with no voiding symptoms.  Unfortunately, his PSA has risen up to 3.61 with 8% free.  This is up from 2.29 with 9% free 2-1/2 years ago, and 1.97 nearly 3 years ago.  In other words, it is rising and it is approaching an abnormal value.  The low percentage free PSA is a concern as well.  He is otherwise feeling well and today's dipstick urinalysis is negative.     Exam of node-bearing areas, abdomen, external genitalia, phallus, hernia canals, and rectum is normal, except I could feel a small right inguinal hernia.  Timothy Cross can feel a bulge as well.  He did undergo bilateral inguinal and umbilical hernia repairs in May, and I am copying Judie Petit, MD, on this note.     He has no rectal masses and his prostate, while a little bit lumpy, is smooth and not terribly enlarged feeling.     The rising PSA is enough of a concern to me despite a relatively normal feeling gland that I think he should undergo a prostate biopsy, and I described the procedure, its risks and purposes to him in some detail.  He has been told by his brother-in-law that it is an unpleasant experience, and I did not deny that.  I told him it was tolerable, however.  He is sufficiently concerned about having the biopsy that we talked also about tests like OPKO 4K or Prostate Health Index or an MRI scan.   However, before jumping to that, we compromised on repeating the PSA, which will be done at the end of this week.  He will assiduously avoid sexual activity and perineal trauma before that.  He is then going to [place] and will contact us about the results and next steps.  If the PSA is down, I probably would want to get a third value at some point soon.     Again, thank you very much for allowing [place] to be involved in the care of your patient.  We will, of course, keep you informed of all urological follow-up.     Sincerely,           Rexene Alberts, MD    EMM/MODL  DD:  04/08/2017 11:00:00  DT:  04/08/2017 11:26:55  Job #:  [id number]    cc:  Frances Nickels, MD   [place] [place], [place] [address]     Suszanne Finch, MD   [place]   [name], [place] [address]

## 2017-04-08 NOTE — Telephone Encounter (Signed)
Pt will pick up form tomorrow

## 2017-04-09 ENCOUNTER — Encounter: Payer: Self-pay | Admitting: Orthopedic Surgery

## 2017-04-09 ENCOUNTER — Ambulatory Visit: Payer: BLUE CROSS/BLUE SHIELD | Attending: Primary Care | Admitting: Orthopedic Surgery

## 2017-04-09 VITALS — BP 128/72 | HR 61

## 2017-04-09 DIAGNOSIS — M1711 Unilateral primary osteoarthritis, right knee: Secondary | ICD-10-CM

## 2017-04-09 DIAGNOSIS — M1712 Unilateral primary osteoarthritis, left knee: Secondary | ICD-10-CM

## 2017-04-09 DIAGNOSIS — D481 Neoplasm of uncertain behavior of connective and other soft tissue: Secondary | ICD-10-CM

## 2017-04-09 NOTE — Progress Notes (Addendum)
[name], Cross  MR #:  [id number]   CSN:  [id number] DOB:  07-Jun-1961   DICTATED BY:  Sharyn Dross, MD, PhD DATE OF VISIT:  04/09/2017     REASON FOR VISIT:  Right knee mass.    HISTORY OF PRESENT ILLNESS:  Timothy Cross is a healthy 56 year old male referred byhis PCP for a right posterior knee mass.  He first noticed this mass many years ago, but it does not cause him any pain.  He has not noticed the mass grow in size substantially over the past couple of years.  He notes having occasional intermittent pain and swelling of his knee, but only with activity.  Dull ache. Does not radiate. 2/10. In the past year, he has decreased the amount he has been running and cycling secondary to his job and has had decreased knee pain.  He does not have pain at baseline.  He has not participated in physical therapy, had intra-articular injections, or used NSAIDs regularly.  Of note, he has had many knee surgeries on his right knee including an ACL reconstruction more than 25 years ago, multiple loose body removals, and chondroplasty surgeries.  Other than hip and knee osteoarthritis, he has no other chronic medical conditions.    Past Medical History:   Diagnosis Date    Anxiety     Arthritis     hip    Colon polyp      Past Surgical History:   Procedure Laterality Date    HERNIA REPAIR      INGUINAL HERNIA REPAIR      KNEE SURGERY Bilateral     arthroscopy ACL reconstruction    PR LAP, VENTRAL HERNIA REPAIR,REDUCIBLE N/A 10/29/2016    Procedure:  UMBILICAL HERNIA REPAIR;  Surgeon: Suszanne Finch, MD;  Location: [facility]IN OR;  Service: General    PR [place] Bilateral 10/29/2016    Procedure:  LAPAROSCOPIC BILATERAL  INGUINAL HERNIA REPAIR with mesh;  Surgeon: Suszanne Finch, MD;  Location: [facility];  Service: General    UMBILICAL HERNIA REPAIR      VASECTOMY      Surgery Of Male Genitalia Vasectomy Conversion Data      Family History   Problem Relation Age of Onset     Conversion Other      [id number]^Large Intestine Cancer^V16.0^Active^    Cancer Mother     Cancer Brother      Social History     Social History    Marital status: Married     Spouse name: N/A    Number of children: N/A    Years of education: N/A     Social History Main Topics    Smoking status: Never Smoker    Smokeless tobacco: Never Used    Alcohol use Yes     1 Glasses of wine, 4 Cans of beer per week      Comment: 3-4 drinks a week     Drug use: No    Sexual activity: Yes     Partners: Female     Birth control/ protection: None      Comment: vasectomy     Other Topics Concern    None     Social History Narrative     No current outpatient prescriptions on file.  Allergies: .personally reviewed by me and documented in erecord.  A full 12 point ROS was reviewed. MSK complaints per HPI. No constitutional sxs. All other systems reviewed  with no pertinent positives or negatives.      PHYSICAL EXAM:  GENERAL:  General: Alert, no acute distress, vital in erecord.    HEENT:Normocephalic. nml sclera. No tracheal deviation  Psych: Appropriate mood and affect. Alert and oriented to person, place, time  Skin: no pallor. No erythema. No rashes or lesions. No lacerations. No fx blisters  CV: RRR. No pitting edema. No vericosities of the extremities.  Pulmonary: nml respiratory effort. No audible wheezing  MSK: no obvious kyphosis or scoliosis. FROM BUE.  Neuro: no tremor. No abnml facial movements.      Lymphatic: No lymphadenopathy at the knee or ankle   EXTREMITIES:  Focused exam of his right knee reveals a palpable mass, approximately 2 x 2 cm, at the posterior aspect of his knee, more so on the lateral side. Active and passive range of motion from 0 degrees extension to approximately 110 degrees of flexion before a mechanical block.  Nontender to palpation in posterior knee over mass, joint line, or around the patella.  There is no instability to varus and valgus stress.  Firm endpoint noted on Lachman's.   Distally neurovascular intact.  Sensation intact to light touch throughout the lower extremity.    IMAGING: All imaging personally reviewed by me today.    Right knee x-ray, 04/01/2017:  Diffuse tricompartmental osteoarthritis with significant osteophytes in all compartments.  Calcified mass behind the femoral condyles.  Interference screws from prior ACL reconstruction visualized.     Right knee MRI, 03/12/2016:  Substantial chondral defects and cartilage thinning noted in all compartments.  Osteophytes are present in all compartments.  A 1.5 x 3 x 2.7 cm heterogeneous osseous mass overlies the lateral femoral condyle surrounded by fluid.  Prior ACL reconstruction graft cannot be visualized.    ASSESSMENT AND PLAN:  Timothy Cross is a healthy 56 year old male who presents with significant right knee osteoarthritis and a posterior mass in his right knee that is consistent with an osteochondral loose body.  The mass has not caused him pain or mechanical symptoms over the past many years and appears stabilized within the soft tissue.  He does have occasional pain and swelling with increased activity, but is able to manage at this time with activity modification and rest.  At this point, he has not undergone conservative treatment for his osteoarthritis, including physical therapy, cortisone injections, or NSAID use as it is not very painful.  We advised him that these are options available to him in the future and he may follow up as needed.  Based on the extent of his osteoarthritis, we anticipate that he will eventually need a total joint replacement.  However, this will be dictated by his symptoms.  The patient is happy with the plan.  All questions were reviewed and answered.       Dictated By:  Sharyn Dross, MD, PhD      ______________________________  Jerene Pitch, MD    DEA/MODL  DD:  04/09/2017 11:02:27  DT:  04/09/2017 11:29:56  Job #:  [id number]    cc:          I have seen and examined  the patient and agree with the above findings, assessment, and plan.  Jerene Pitch, MD  04/12/2017  8:26 AM

## 2017-04-11 DIAGNOSIS — D126 Benign neoplasm of colon, unspecified: Secondary | ICD-10-CM | POA: Diagnosis not present

## 2017-04-11 DIAGNOSIS — Z800 Family history of malignant neoplasm of digestive organs: Secondary | ICD-10-CM | POA: Diagnosis not present

## 2017-04-11 DIAGNOSIS — Z1211 Encounter for screening for malignant neoplasm of colon: Secondary | ICD-10-CM | POA: Diagnosis not present

## 2017-04-11 DIAGNOSIS — K5730 Diverticulosis of large intestine without perforation or abscess without bleeding: Secondary | ICD-10-CM | POA: Diagnosis not present

## 2017-04-12 ENCOUNTER — Other Ambulatory Visit
Admission: RE | Admit: 2017-04-12 | Discharge: 2017-04-12 | Disposition: A | Discharge status: Home / Self Care | Payer: BLUE CROSS/BLUE SHIELD | Source: Ambulatory Visit | Attending: Urology | Admitting: Urology

## 2017-04-12 DIAGNOSIS — R972 Elevated prostate specific antigen [PSA]: Secondary | ICD-10-CM | POA: Insufficient documentation

## 2017-04-12 DIAGNOSIS — N402 Nodular prostate without lower urinary tract symptoms: Secondary | ICD-10-CM

## 2017-04-12 LAB — PSA, TOTAL AND FREE
PSA (eff. 4-2010): 3.73 ng/mL (ref 0.00–4.00)
PSA,FREE (eff. 4-2010): 0.27 ng/mL
PSA,Free %: 7 %

## 2017-04-12 LAB — TESTOSTERONE: Testosterone: 278 ng/dL (ref 193–740)

## 2017-04-19 ENCOUNTER — Telehealth: Payer: Self-pay | Admitting: Urology

## 2017-04-19 NOTE — Telephone Encounter (Signed)
Called to discuss recent PSA and pt not home until next week.  Advised wife to call to discuss.

## 2017-05-15 ENCOUNTER — Ambulatory Visit: Payer: BLUE CROSS/BLUE SHIELD | Attending: Sports Medicine | Admitting: Sports Medicine

## 2017-05-15 ENCOUNTER — Telehealth: Payer: Self-pay | Admitting: Urology

## 2017-05-15 ENCOUNTER — Encounter: Payer: Self-pay | Admitting: Sports Medicine

## 2017-05-15 VITALS — BP 126/67 | HR 59 | Ht 72.0 in | Wt 175.0 lb

## 2017-05-15 DIAGNOSIS — M1611 Unilateral primary osteoarthritis, right hip: Secondary | ICD-10-CM

## 2017-05-15 NOTE — Progress Notes (Signed)
C.C.: Presents for right hip pain.    HPI: Patient states symptoms started several years ago.  Denies injury or trauma.  Reports gradual onset over the years, worsens with activity and as of lately.  Patient underwent inguinal hernia repair this summer by Dr. Quay Burow, he was not having pain with that.  Denies previous injury, surgery.  Denies numbness/tingling.      Symptoms are located right groin and lateral hip, does radiate along lateral thigh.       Symptoms are Intermittent, and are worsening since onset.  Pain is described as aching, rated as a 0/10.  Aggravated by ambulation/activity or driving, alleviated with nothing.  Denies swelling, redness, warmth, bruising.  Reports clicking and popping, denies instability/giving out.  Denies locking.        Patient has tried:  []  icing  []  elevation   []  rest  []  heat  [x]  anti-inflammatories: ibuprofen 400mg  as needed, rarely.  Aleve sparingly  []  Tylenol  []  injection:  []  PT  []  Brace  []  Chiropractic  []  Massage therapy  []  Acupuncture  []  Other: ___    Activities: golf, walking.    Employment: Chief Financial Officer.        Patient has previously been seen for this. Consult requested by PCP.     Patient's medications, allergies, and surgical histories: Per patient questionnaire, reviewed and confirmed.  Past medical history: Per patient questionnaire, reviewed and confirmed.  Patient did not complete.  Social history: Per patient questionnaire, reviewed and confirmed.   reports that he has never smoked. He has never used smokeless tobacco..  Family history: Per patient questionnaire, reviewed and confirmed.  Patient did not complete.    Evaluation to date: I personally reviewed patient's X-ray which shows moderate-advanced joint space loss with subchondral cystic change and osteophyte formation.  A radiologist report is available for review.     ROS:  Review of systems:  Per patient questionnaire.  Details include: negative.  Denies fever, chills, rash, night sweats,  unintentional weight loss.  All other systems negative.       OBJECTIVE:   Vital signs:    Vitals:    05/15/17 0847   BP: 126/67   Pulse: 59   Weight: 79.4 kg (175 lb)   Height: 1.829 m (6')     General: comfortable male  Mental Status:  Alert and oriented x3, pleasant and cooperative with exam  Extremities: All 4 extremities without edema, clubbing or cyanosis  Gait: Non-antalgic, able to toe walk, heel walk without difficulty.     Spine: No swelling, erythema, ecchymosis or step-off deformity.  Patient is nontender to palpation midline or over paravertebral soft tissue.  Nontender to palpation over SI joints bilaterally.  Full active range of motion, increased pain with right axial rotation.  Lower extremity strength is 5/5, symmetric compared to contralateral leg.  Neurovascularly intact. (-) SLR bilaterally.    Hip and Pelvis:  No swelling, erythema, ecchymosis or deformity. No obvious effusion, no warmth. TTP: non-TTP. There is some diminished range with pain.  Neurovascularly intact.  Full strength with hip flexion, extension, abduction and adduction without pain.     Patrick's / FABER test: positive.     FADIR test: positive.    Hip scouring: positive.       ASSESSMENT / PLAN:  Impression:   56 y.o. male with moderate-advanced R hip OA.  His symptoms are pretty well controlled with minimal intervention.    Plan:   -Discussed PT, patient defers  -  Reviewed medication options prn for pain  -Encouraged activity as tolerated  -Offered CSI, patient defers at this time as symptoms are well-controlled  -Reviewed referral for arthroplasty would be based on symptoms and failure of non-operative measures and is not needed at this time  -f/u prn, if pain worsens or patient desires further intervention    Questions solicited and answered.     Precautions reviewed.  Medication changes, refills reviewed including directions, side effects and monitoring.    ATTENDING PHYSICIAN NOTE:    At the time of the visit, I saw and  evaluated the patient with the PA.  I agree with the PA's history, findings, and plan of care as documented above.  The details of my evaluation, involvement, and decision-making are as follows:     I personally obtained the following history: hip pain  My examination reveals the following: poor ROM.  Assessment: moderate to severe OA  I agree with the plan as outlined above    Edman Circle, MD, FAAFP  Sports Medicine Attending Physician

## 2017-05-15 NOTE — Telephone Encounter (Signed)
Returned pt's call and no answer and left message that DR. Messing is recommending a prostate biopsy.  This can be done wither in the clinic or the OR.  Ask that he call me back to discuss.

## 2017-05-15 NOTE — Telephone Encounter (Signed)
Spoke with the pt and he would like to proceed with the prostate biopsy.  Instructed about fleets enema and he is not on any blood thinners.

## 2017-05-15 NOTE — Patient Instructions (Signed)
Ibuprofen 400mg  every 4-6 hours as needed MAX: 2,400mg     OR    Aleve 220-440mg  every 12 hours as needed MAX: 1,000mg     AND    Tylenol (acetaminophen) Extra Strength 500mg  every 4-6 hours  OR   1,000mg  every 8 hours  MAX: 3,000mg  daily

## 2017-05-17 ENCOUNTER — Telehealth: Payer: Self-pay | Admitting: Urology

## 2017-05-17 NOTE — Telephone Encounter (Signed)
Patient is on list.

## 2017-06-06 ENCOUNTER — Telehealth: Payer: Self-pay | Admitting: Urology

## 2017-06-06 NOTE — Telephone Encounter (Signed)
Spoke with pt who states that he has a TRUS BX on Monday and asked about the fleets enema, Instructed pt that he needs to administer the fleets 1 hour prior to the TRUS BX and that is can vary on the time it will take effect. He lives about 35-40 minutes away. Explained to pt why he needed the fleets as well. He understood and will see [place] on Monday.

## 2017-06-06 NOTE — Telephone Encounter (Signed)
Patient is calling to get a some instructions on his up and coming prostate biopsy exam.      Please call the patient back at [phone] (M)

## 2017-06-10 ENCOUNTER — Ambulatory Visit: Payer: BLUE CROSS/BLUE SHIELD | Attending: Pathology | Admitting: Urology

## 2017-06-10 ENCOUNTER — Encounter: Payer: Self-pay | Admitting: Urology

## 2017-06-10 VITALS — BP 110/80 | HR 60 | Temp 97.9°F | Ht 72.0 in | Wt 175.0 lb

## 2017-06-10 DIAGNOSIS — N402 Nodular prostate without lower urinary tract symptoms: Secondary | ICD-10-CM | POA: Insufficient documentation

## 2017-06-10 DIAGNOSIS — R972 Elevated prostate specific antigen [PSA]: Secondary | ICD-10-CM | POA: Insufficient documentation

## 2017-06-10 MED ORDER — CIPROFLOXACIN HCL 500 MG PO TABS *I*
500.0000 mg | ORAL_TABLET | Freq: Once | ORAL | Status: AC
Start: 2017-06-10 — End: 2017-06-10
  Administered 2017-06-10: 500 mg via ORAL

## 2017-06-10 MED ORDER — CEFTRIAXONE SODIUM 1 GM IV/IJ SOLR *WRAPPED*
1.0000 g | Freq: Once | INTRAMUSCULAR | Status: AC
Start: 2017-06-10 — End: 2017-06-10
  Administered 2017-06-10: 1 g via INTRAMUSCULAR
  Filled 2017-06-10: qty 10

## 2017-06-10 NOTE — Patient Instructions (Signed)
Post Prostate Biopsy Instructions    PLEASE READ CAREFULLY    1. The biopsy needle reaches the prostate through the rectum.  The needle can enter the urethra as well (through tube in penis).    2. You may experience some blood in your stool and/or urine for up to one week.  You may also see blood in semen which can last up to one month.    3. Avoid strenuous physical activity for 3-5 days.  You may perform all other usual activities.    4. If you experience pain or discomfort, take two (2) Tylenol (acetaminophen) every 4-6 hours.  DO NOT take aspirin as this may accentuate bleeding.    5. Notify doctor if you experience fever, chills, cloudy or odorous urine, persistent burning or pain develops.    6. If bleeding occurs, drink plenty of fluids and rest.    7. Notify doctor if you have large amounts of rectal/urinary bleeding.    Physician name: Dr. Garnet Sierras  Telephone #: [phone]  Instructions reviewed with patient by Waymon Amato

## 2017-06-10 NOTE — Procedures (Cosign Needed)
06/10/2017    Surgeon - Dr. Garnet Sierras     Medication list was reviewed with the patient today.    Patient was given Ceftriaxone 1 gram, Cipro 500 mg 1 tablet, prior to today's procedure-   Patient took Enema - yes  Patient is not currently on Blood thinners    Timothy Cross was seen in our Urology office today and an outpatient office transrectal ultrasound and prostate needle biopsy was performed under ultrasound guidance to rule out prostatic malignancy.      The provider took prostate biopsy specimens from the right base, mid and apex of the prostate, as well as the left base, mid and apex of the prostate.  A total number of 12 biopsy specimens were obtained.       Images were printed and stored in the patients electronic medical record.      The patient tolerated today's procedure well.    Spivey

## 2017-06-10 NOTE — Progress Notes (Signed)
Dear Dr. Gilford Rile,    I saw your patient, Mr. Timothy Cross for scheduled transrectal ultrasound-guided prostate biopsy on 06/10/2017.    Reason for prostate biopsy:  Elevated PSA: 3.73 with 7% freeng/mL.  Abnormal digital exam: Nodular prostate.      The risks, benefits, complications, and expected outcomes were discussed with the patient. The patient concurred with the proposed plan, giving informed consent. He has been prepped according to our protocol for TRUS biopsy with Fleets enema and PO antibiotics.     Patient was brought to the procedure suite and placed in left lateral decubitus position.    Digital Rectal Exam:  He has a normal anal sphincter tone without rectal masses.  Prostate size was 20 cc, smooth, nontender, and with nodularity on the right or induration.  Seminal vesicles nonpalpable.    Transrectal Ultrasound Exam: Prostate was viewed in transverse and sagittal planes. No discrete hyper or hypoechoic lesions were not noted. Prostate and seminal vesicles appeared to be symmetric. Prostate was measured at 18.4 cc, with calculated PSAD 0.2.     A total of 10 cc 1% lidocaine was injected at the prostate-seminal vesical junction on both sides. A 12-core biopsy was performed, 2 biopsies in the base, mid, and apical regions of the prostate on both sides.    No significant bleeding noted. Patient tolerated the procedure and was instructed to rest for next 24-48 hours and to call with any fevers, chills, significant hematuria or difficulty urinating.    Patient will return to the office for discussion of biopsy results in approximately 1 week.      Thank you for allowing me to participate in your patient's care. I will keep you informed of all urological follow-up.    Very truly yours,

## 2017-06-17 ENCOUNTER — Telehealth: Payer: Self-pay | Admitting: Urology

## 2017-06-17 ENCOUNTER — Encounter: Payer: Self-pay | Admitting: Urology

## 2017-06-17 ENCOUNTER — Ambulatory Visit: Payer: BLUE CROSS/BLUE SHIELD | Attending: Urology | Admitting: Urology

## 2017-06-17 VITALS — BP 127/61 | HR 62 | Temp 97.0°F | Ht 72.0 in | Wt 175.0 lb

## 2017-06-17 DIAGNOSIS — R972 Elevated prostate specific antigen [PSA]: Secondary | ICD-10-CM

## 2017-06-17 DIAGNOSIS — C61 Malignant neoplasm of prostate: Secondary | ICD-10-CM | POA: Insufficient documentation

## 2017-06-17 LAB — POCT SIEMENS URINALYSIS DIPSTICK
Glucose,UA POCT: NEGATIVE mg/dL
Ketones,UA POCT: NEGATIVE mg/dL
Kit Lot: 802052
Leukocyte,UA POCT: NEGATIVE
Nitrite,UA POCT: NEGATIVE
Protein,UA POCT: NEGATIVE mg/dL
pH,UA POCT: 6.5 (ref 5.0–8.0)

## 2017-06-17 LAB — SURGICAL PATHOLOGY

## 2017-06-17 NOTE — Progress Notes (Signed)
Dictated Note

## 2017-06-17 NOTE — Telephone Encounter (Signed)
Bone Scan scheduled for 06/27/17. Injection 11:15 am 12:10pm, Bone Scan 2:15pm.  at [address] NPO 2 hrs prior to scan.

## 2017-06-17 NOTE — Letter (Signed)
PATIENT NAME:  Timothy Cross, Timothy Cross   DATE OF SERVICE:  06/17/2017  MRN:  [id number]  CSN:  [id number]  Page 2  [address] [place]  [place], [place] [id number]  [id number]     June 17, 2017    Lonie Peak, MD   [address] [place], [place], [place] [address]    RE:   JOREL, GRAVLIN   DOB:  [birth year]  Unit#:  [id number]  CSN:  [id number]    Dear Dr. Gilford Rile:     This is just a brief followup on your patient, Tramar Brueckner, who 10 days ago or so underwent a transrectal ultrasonically guided needle biopsy of the prostate.  He is recovering well from that, although does have some microhematuria.  Unfortunately, the biopsies did reveal 3 focuses of prostate cancer, one in the right apex and the other in the left midportion had small areas of Gleason 3 + 3 = 6 cancer but another, at the left apex, had 40% of a single core with Gleason 3 + 4 cancer.  The latter is a bit more aggressive than the 3+ 3 cancer as you know.  The concern we have is that he is only 42 and relatively healthy and hence, usually in someone like this while observation can be done, it is probably not the best approach.  We are going to proceed with a metastatic workup, which has a very high probability of being negative, a bone scan and CT of the pelvis, and then have him return to talk to [place] further.  We did discuss definitive treatments for prostate cancer including radical prostatectomy, external beam radiation, seed implant brachytherapy, and even discussed HIFU.  The last is probably not a great idea because he has multifocal disease, although one could focus on just the left apex.  The remainder of the other treatments are of course treating the whole gland.  Potential risks and complications including issues with urinary control and erections were discussed.  Furthermore, we noted that it was on the right side that he had a slight irregularity in the prostate, while the major areas of cancer  were on the left.  Exactly what that means I am unsure since the formal biopsy report is not yet back and how much inflammation there was in the prostate, I do not know.     We will be seeing him again in about 10 days after the bone scan and CT scan are done.     Again, thank you very much for allowing [place] to be involved in the care of your patient.  We will, of course, keep you informed of all urological followup.     Very truly yours,           Rexene Alberts, MD    EMM/MODL  DD:  06/17/2017 15:58:11  DT:  06/17/2017 16:29:17  Job #:  [id number]    cc:  Lonie Peak, MD, fax [phone]   [address] [place], [place], [place] [address]  EXTERNAL RECIPIENT

## 2017-06-27 ENCOUNTER — Ambulatory Visit
Admission: RE | Admit: 2017-06-27 | Discharge: 2017-06-27 | Disposition: A | Discharge status: Home / Self Care | Payer: BLUE CROSS/BLUE SHIELD | Source: Ambulatory Visit | Attending: Urology | Admitting: Urology

## 2017-06-27 ENCOUNTER — Ambulatory Visit
Admission: RE | Admit: 2017-06-27 | Discharge: 2017-06-27 | Disposition: A | Discharge status: Home / Self Care | Payer: BLUE CROSS/BLUE SHIELD | Source: Ambulatory Visit

## 2017-06-27 DIAGNOSIS — C61 Malignant neoplasm of prostate: Secondary | ICD-10-CM

## 2017-06-27 DIAGNOSIS — N400 Enlarged prostate without lower urinary tract symptoms: Secondary | ICD-10-CM

## 2017-06-27 MED ORDER — IOHEXOL 350 MG/ML (OMNIPAQUE) IV SOLN *I*
1.0000 mL | Freq: Once | INTRAVENOUS | Status: AC
Start: 2017-06-27 — End: 2017-06-27
  Administered 2017-06-27: 100 mL via INTRAVENOUS

## 2017-06-27 MED ORDER — TECHNETIUM TC-99M MEDRONATE (MDP) IV *I*
18.0000 | PACK | Freq: Once | INTRAVENOUS | Status: AC
Start: 2017-06-27 — End: 2017-06-27
  Administered 2017-06-27: 20.8 via INTRAVENOUS

## 2017-06-27 MED ORDER — STERILE WATER FOR IRRIGATION IR SOLN *I*
900.0000 mL | Freq: Once | Status: AC
Start: 2017-06-27 — End: 2017-06-27
  Administered 2017-06-27: 900 mL via ORAL

## 2017-07-06 ENCOUNTER — Encounter: Payer: Self-pay | Admitting: Urology

## 2017-07-06 ENCOUNTER — Ambulatory Visit: Payer: BLUE CROSS/BLUE SHIELD | Attending: Urology | Admitting: Urology

## 2017-07-06 VITALS — BP 130/63 | HR 65 | Temp 98.8°F | Ht 72.0 in | Wt 175.0 lb

## 2017-07-06 DIAGNOSIS — C61 Malignant neoplasm of prostate: Secondary | ICD-10-CM

## 2017-07-06 DIAGNOSIS — R972 Elevated prostate specific antigen [PSA]: Secondary | ICD-10-CM

## 2017-07-06 LAB — POCT SIEMENS URINALYSIS DIPSTICK
Blood,UA POCT: NEGATIVE
Glucose,UA POCT: NEGATIVE mg/dL
Ketones,UA POCT: NEGATIVE mg/dL
Kit Lot: 803049
Leukocyte,UA POCT: NEGATIVE
Nitrite,UA POCT: NEGATIVE
Protein,UA POCT: NEGATIVE mg/dL
Specific gravity,UA POCT: 1.01 (ref 1.002–1.030)
pH,UA POCT: 6.5 (ref 5.0–8.0)

## 2017-07-06 NOTE — Progress Notes (Signed)
Dictated Note

## 2017-07-06 NOTE — Letter (Signed)
PATIENT NAME:  [name], [name]  DATE OF SERVICE:  07/06/2017  MRN:  [id number]  CSN:  [id number]  Page 2  [address]  [place], [place] [id number]  [id number]     July 06, 2017    Lonie Peak, MD   [place], [place] [address]   [place], [place] [address]    RE:   [name], [name]  DOB:  May 14, 1961  Unit#:  [id number]  CSN:  [id number]    Dear [name]:     This is just a brief note on your patient, Timothy Cross, a former patient of Rennie Natter, who had a mildly elevated PSA (still below 4) with a lumpy but not too suspicious prostate gland.  However, because of the rise of PSA and his youth, he did undergo a prostate biopsy, which was done almost a month ago.  The biopsy unfortunately revealed one area of Gleason 3 + 4 equals 7/10 prostate cancer and 2 regions of Gleason 6 cancer.  Bone scan and CT of the pelvis were negative for extraprostatic disease.  Today, we talked about the fact that the metastatic workup was negative and then had an extensive discussion about management options.  These included surgery, which I think could be done through a robotic approach, external beam radiation, seed implant brachytherapy, a combination of the two (which I do not think he would need), or focal therapy such as HIFU.  The advantages and disadvantages of each were discussed in great detail.  The issues of bladder irritation and rectal irritation with external beam radiation, urinary control issues and urinary retention with brachytherapy, and erectile dysfunction with virtually all the treatments (but much less with HIFU) were discussed.  The advantages of each were also talked about, including getting immediate histologic confirmation about the tumor with surgery and not with the nonsurgical approaches, and the fact that the PSA should go to undetectable after surgery and not with the other approaches, making it easier to follow patients after surgery than with the  nonsurgical techniques.  At this stage, however, concerned particularly about erectile functioning, [name] is leading to meaning towards nonsurgical approach, and I think he would most be interested in seed implant brachytherapy.  With this issue in mind, we are going to have him see Dr. Virgilio Frees of our radiation oncology unit, who does the seed implantation along with me in the operating room.  The expected course and potential side effects of brachytherapy were discussed, and the appointment with Lennette Bihari will be at our multidisciplinary [facility] in 9 days.  I will see him as well.  Today's dipstick urinalysis is negative, so the hematuria induced by the biopsy is now gone.     Again, thank you very much for allowing [place] to be involved in the care of your patient.  We will, of course, keep you informed of all urological followup.       Very truly yours,           Timothy Alberts, MD    EMM/MODL  DD:  07/06/2017 10:26:05  DT:  07/06/2017 13:49:56  Job #:  [id number]    cc:  Lonie Peak, MD   [address] [place]   [place], [place] [address]

## 2017-07-08 ENCOUNTER — Telehealth: Payer: Self-pay

## 2017-07-08 NOTE — Telephone Encounter (Signed)
Called patient and informed of appt in the cancer center on 07/22/17 at 2:30 arrive 10 mins early

## 2017-07-08 NOTE — Telephone Encounter (Signed)
Timothy Cross is returning a call from [place]. He states this is regarding scheduling. He is requesting a call back at [phone].

## 2017-07-10 ENCOUNTER — Other Ambulatory Visit: Payer: BLUE CROSS/BLUE SHIELD | Admitting: Radiology

## 2017-07-10 ENCOUNTER — Other Ambulatory Visit: Payer: BLUE CROSS/BLUE SHIELD

## 2017-07-20 NOTE — H&P (Deleted)
Radiation Oncology CONSULTATION (07/20/2017)    Patient Identifier:  Timothy Cross is a 57 y.o. male with favorable intermediate risk prostate cancer (cT1c, Gleason 3+4 in 1 core, 3/12 cores involved, PSA 3.73), gland 18.4cc. He presents to the radiation oncology department at [facility] for consultation.    Diagnosis (including stage):  Cancer Staging  Prostate cancer  Staging form: Prostate, AJCC 8th Edition  - Clinical stage from 07/21/2017: Stage IIB (cT1c, cN0, cM0, PSA: 3.7, Grade Group: 2) - Signed by Dolores Frame, MD on 07/21/2017      Past medical history significant for bilateral inguinal and umbilical hernia repair, anxiety, hip arthritis      Oncologic History:   Urologist: Dr. Lamar Blinks    -03/14/12 PSA 1.28   -03/08/14 PSA 1.97, 13% free. DRE: larger, softer right lobe, likely BPH. Left side smaller, denser.  -11/01/14 PSA 2.29, 9% free  -04/12/17 PSA 3.73 7% free   -06/10/17 [Dr. Messing] DRE: nodularity on right. TRUS showed no discrete lesions. Volume: 18.4cc. PSAD 0.2.   Standard 12-core biopsy: prostate adenocarcinoma     RIGHT   LEFT   Apex Gleason 3+3, 1/2 cores, 5% Gleason 3+4, 1/2 cores, 40%   Mid no malignancy/benign Gleason 3+3, 1/2 cores, 5%   Base no malignancy/benign  no malignancy/benign   -06/27/17 NM and CT pelvis staging scans: negative. Prostate gland mildly enlarged and heterogeneous.    Current therapy: deciding    Bleeding history:  Anticoagulation: .    Urinary symptoms: minimal  Incontinence: ***  Voiding: ***  Nocturia: ***    GI history:  Diarrhea: ***  Rectal bleeding: ***  Last colonoscopy: 03/24/14. Findings: tubular adenoma, hyperplastic polyp    Sexual function history:  Erections: yes  Change if on hormones: ***    Family history of prostate cancer: ***    Radiation specific concerns:  Cardiac pacemaker/AICD: ***  Difficulty lying flat: ***  Autoimmune disease (SLE, scleroderma): ***  Previous radiation: ***  Diabetes/insulin pump: ***  Renal dysfunction:  ***  Claustrophobia: ***    Past Medical History  Past Medical History:   Diagnosis Date    Anxiety     Arthritis     hip    Cancer     Colon polyp        Past Surgical History  Past Surgical History:   Procedure Laterality Date    HERNIA REPAIR      INGUINAL HERNIA REPAIR      KNEE SURGERY Bilateral     arthroscopy ACL reconstruction    PR LAP, VENTRAL HERNIA REPAIR,REDUCIBLE N/A 10/29/2016    Procedure:  UMBILICAL HERNIA REPAIR;  Surgeon: Suszanne Finch, MD;  Location: [facility] MAIN OR;  Service: General    PR Mellen Bilateral 10/29/2016    Procedure:  LAPAROSCOPIC BILATERAL  INGUINAL HERNIA REPAIR with mesh;  Surgeon: Suszanne Finch, MD;  Location: [facility] MAIN OR;  Service: General    UMBILICAL [place]      Surgery Of Male Genitalia Vasectomy Conversion Data        Medications  No current outpatient prescriptions on file.     No current facility-administered medications for this visit.        Allergies  No Known Allergies (drug, envir, food or latex)    Family History  Family History   Problem Relation Age of Onset    Conversion Other         [phone]  Intestine Cancer^V16.0^Active^    Cancer Mother     Cancer Brother        Social History  Social History     Social History    Marital status: Married     Spouse name: N/A    Number of children: N/A    Years of education: N/A     Occupational History    Not on file.     Social History Main Topics    Smoking status: Never Smoker    Smokeless tobacco: Never Used    Alcohol use Yes     1 Glasses of wine, 4 Cans of beer per week      Comment: 3-4 drinks a week     Drug use: No    Sexual activity: Yes     Partners: Female     Birth control/ protection: None      Comment: vasectomy     Social History Narrative    No narrative on file         Review of Systems: As per history of present illness.    Physical Examination  There were no vitals taken for this visit.  There were no vitals filed for this  visit.    Performance Status: {ONC BCN  RAD ONC PERFORMANCE [id number]    General: well appearing, pleasant, in no acute distress, alert and oriented  CV: regular rate.   Abd: soft, non-tender, no guarding  Pulm: breath sounds clear bilaterally, no crackles, no wheezing  MSK: good tone  Neuro: no focal deficits  Psych: appropriate affect, good mood    Diagnostic and Laboratory Data Reviewed:  See subjective above. Both radiologic images and reports were personally reviewed and discussed with Timothy Cross and family.      ***    IMPRESSION  Timothy Cross is a 57 y.o. male with a history of favorable intermediate risk prostate cancer who was referred to the department of radiation oncology for evaluation of radiotherapy options.    Per NCCN guidelines for favorable intermediate risk patients who have expected survival >/= 10 years, the options include  1) active surveillance (PSA q2m, DRE q17m, repeat biopsy q85m)  2) EBRT or brachytherapy alone  3) radical prostatectomy +/- lymph node dissection if probability of mets >/= 2%    If the surgery route is chosen and there are adverse features found (positive margins, SVI, ECE, detectable PSA), it may be further recommended to undergo further radiation either as adjuvant or observation+salvage. If there are positive lymph nodes, then ADT+/-EBRT or observation are options.     In recent years, the NCCN has moved away from using ADT in low risk and favorable intermediate risk patients.     His prostate is 18.4 cc which meets the upper limit of 50-60cc as suggested by brachytherapy guidelines. This option would be used as monotherapy in place of EBRT.    DISCUSSION  We had an extensive discussion with Timothy Cross regarding his diagnosis and therapeutic options.     The major side effects include acute/late urinary incontinence (urgency, frequency), bowel toxicity (diarrhea, rectal bleeding), and sexual dysfunction (erectile dysfunction, decreased  ejaculate).    After discussing the risks, benefits, and alternatives to radiation therapy in addition to the early and late side effects, Timothy Cross was amenable to pursuing radiotherapy.  All of the patients questions were answered to his satisfaction*** and informed consent was obtained.    RECOMMENDATIONS   ***AS vs HDR vs  LDR   Simulation *** week    We informed the patient that if there are any questions, concerns, or changes in clinical status in the interim to please contact us.    Dolores Frame, MD, PhD  PGY4 Radiation Oncology

## 2017-07-22 ENCOUNTER — Ambulatory Visit: Payer: BLUE CROSS/BLUE SHIELD | Attending: Radiation Oncology | Admitting: Radiation Oncology

## 2017-07-22 ENCOUNTER — Ambulatory Visit: Payer: BLUE CROSS/BLUE SHIELD | Admitting: Urology

## 2017-07-22 ENCOUNTER — Telehealth: Payer: Self-pay | Admitting: Urology

## 2017-07-22 VITALS — BP 136/63 | HR 60 | Temp 97.2°F | Resp 20 | Ht 72.01 in | Wt 172.8 lb

## 2017-07-22 DIAGNOSIS — C61 Malignant neoplasm of prostate: Secondary | ICD-10-CM | POA: Insufficient documentation

## 2017-07-22 NOTE — Progress Notes (Signed)
Dictated Note

## 2017-07-22 NOTE — Telephone Encounter (Signed)
Patient is calling to check on the status of a call back regarding the message below. Please call him back to discuss ASAP at [phone].

## 2017-07-22 NOTE — Telephone Encounter (Signed)
The patient is requesting to speak with office staff. Patient stated that this is regarding his appointments scheduled with oncology. Patient is inquiring why he has one appointment at 2:30 PM and one at 3:30 PM. He states that he thought he was supposed to be meeting with both Dr. Lamar Blinks and Dr. Virgilio Frees at the same time.    Please call the patient back at [phone] to discuss.

## 2017-07-22 NOTE — Letter (Signed)
PATIENT NAME:  Timothy Cross, Timothy Cross  DATE OF SERVICE:  07/22/2017  MRN:  [id number]  CSN:  [id number]  Page 2  [address] [place], [place]  [id number]     July 22, 2017    Lonie Peak, MD   [address]   [place], [place] [address]   Fax:  [phone]    RE:   Timothy Cross, Timothy Cross  DOB:  1960-06-18  Unit#:  [id number]  CSN:  [id number]    Dear [name],     This is just a brief followup on your patient, Timothy Cross, who had a mildly elevated PSA and a lumpy, but not too suspicious, prostate gland.  He had undergone a prostate biopsy, which unfortunately did reveal 1 area of Gleason 3 + 4 equals 7/10 cancer and 2 areas of Gleason 6 cancer.  Metastatic workup with a bone scan and CT of the pelvis were negative.  As you know from our last communication, we spent considerable time discussing management options ranging from observation alone to attempt definitive cure including radical prostatectomy, external beam radiation, seed implant brachytherapy or HIFU and the potential advantages and disadvantages of each.  He has made up his mind that he would like to undergo brachytherapy and, hence, we spent considerable time today talking about the experience including the fact that it would be done under anesthesia, I would cystoscope him at the end, and that he would have a catheter for a few days afterwards.  Voiding difficulties are rare, but can occur after the procedure and certainly erectile function can occur, but is far less likely than with external beam radiation or radical surgery.  He would like to have the seed placement timed sometime that he could coordinate it with his planned overseas travel (to [place]).  I would not do it shortly before he plans to go for fear that he would have voiding problems or other difficulties.  I have also explained that, for at least a month afterwards, he should not have sexual activity or perineal trauma of  any sort.  We have left it right now that he will be meeting with Dr. Virgilio Frees and, if Timothy Cross is agreeable with this, we will look for a day to schedule Timothy Cross.     Again, thank you very much for allowing [place] to be involved in the care of your patient.  We will, of course, keep you informed of all urological followup.     Very truly yours,           Rexene Alberts, MD, FACS    EMM/MODL  DD:  07/22/2017 14:43:36  DT:  07/22/2017 15:01:38  Job #:  [id number]    cc:  Lonie Peak, MD   [address], [place]   [place], [place] [address]

## 2017-07-23 NOTE — H&P (Signed)
Radiation Oncology New Patient Consultation     Diagnosis: Prostate Cancer, Favorable Intermediate Risk     Oncologic History:   03/2017 - Messing: short, I originally saw him because of a minimally lumpy prostate and a fairly low PSA with no voiding symptoms.  Unfortunately, Timothy PSA has risen up to 3.61 with 8% free.  This is up from 2.29 with 9% free 2-1/2 years ago, and 1.97 nearly 3 years ago.  In other words, it is rising and it is approaching an abnormal value.  The low percentage free PSA is a concern as well.    06/10/17 - TRUS and biopsy ([facility])   20 cc prostate   RA: 3+3 in 5% of 1 /2 cores   RM: normal   RB: normal   LA: 3+4 (20% 4) in 20-40% of 1 /2 cores   LM: 3+3 in 5% of 1 /2 cores   LB: normal  06/27/17 - CT pelvis: 1. Known prostate cancer. Prostate gland is mildly enlarged and heterogeneous.  2. No lymphadenopathy.  06/27/17 - NM Bone Scan: No scintigraphic evidence of osseous metastatic disease    Timothy Cross was seen today along with Timothy Cross for a new consultation regarding Timothy recently diagnosed prostate cancer.   He reports no significant voiding symptoms. He says at time he has more urinary frequency but not significant.   No urgency, weak stream, incomplete emptying, or dysuria.   No bowel symptoms.   Intact erectile function. No bony pain.     ROS: On complete review of systems, he denies any fevers, chills, fatigue, poor appetite, nausea, vomiting, diarrhea, constipation.    No Known Allergies (drug, envir, food or latex)  No current outpatient prescriptions on file.     No current facility-administered medications for this visit.      Past Medical History:   Diagnosis Date    Anxiety     Arthritis     hip    Cancer     Colon polyp      Social History   Substance Use Topics    Smoking status: Never Smoker    Smokeless tobacco: Never Used    Alcohol use Yes     1 Glasses of wine, 4 Cans of beer per week      Comment: 3-4 drinks a week      family history includes Cancer in Timothy brother and  mother; Conversion in an other family member.    Physical Exam:   Well developed, well nourished gentleman in no acute distress.  Very pleasant and cooperative.  BP 136/63    Pulse 60    Temp 36.2 C (97.2 F) (Temporal)    Resp 20    Ht 182.9 cm (6' 0.01")    Wt 78.4 kg (172 lb 13.5 oz)    SpO2 98%    BMI 23.44 kg/m . KPS 90%.   Rectal exam deferred given recent exam by Dr. Lamar Blinks which revealed no nodules. We will preform a rectal exam when he comes for prostate seed implant brachytherapy planning ultrasound.     Laboratory data:   Lab Results   Component Value Date    PSAR3 3.73 04/12/2017    PSAR3 3.61 03/15/2017    PSAR3 2.29 11/01/2014     Imaging data:  Bone Scan and CT scan of the pelvis as above.     Impression/Plan:   Favorable intermediate risk prostate cancer, low volume, in a very pleasant 57 yo gentleman in good health  with no significant urinary symptoms.   We discussed treatment options for Timothy disease. Due to the Gleason 7 component, and the doubling time of around 3 years, I would recommend definitive treatment at this time for Timothy cancer.   We discussed the definitive radiation options of external beam radiation therapy (likely 5.5 weeks of treatment), LDR and HDR brachytherapy monotherapy. I would not use hormonal therapy given Timothy favorable status.     He is most interested in LDR (permanent seed) brachytherapy.   We discussed possible side effects of radiation (urinary and bowel irritation, erectile dysfunction), radiation precautions, and the need for a foley after the procedure. He does wish to proceed.   We will schedule a brachytherapy planning ultrasound in the near future.   We will then plan for the seed implant to take place in April, per patient preference, when he has returned from a Planned trip.    Dorothey Baseman MD

## 2017-08-27 ENCOUNTER — Inpatient Hospital Stay
Admission: RE | Admit: 2017-08-27 | Discharge: 2017-08-27 | Disposition: A | Discharge status: Home / Self Care | Payer: BLUE CROSS/BLUE SHIELD | Source: Ambulatory Visit | Attending: Radiation Oncology | Admitting: Radiation Oncology

## 2017-08-27 DIAGNOSIS — C61 Malignant neoplasm of prostate: Secondary | ICD-10-CM

## 2017-08-28 NOTE — Progress Notes (Signed)
Brachytherapy Planning Ultrasound     Timothy Cross is a 57 y.o. man with favorable intermediate risk prostate cancer. He underwent a brachytherapy planning transrectal ultrasound today for evaluation for possible prostate seed implant.     He was placed in the dorsal high lithotomy position, and the lidocaine jelly was applied to the rectum. Without difficulty, the rectal ultrasound probe was passed into the rectum. Serial axial images of the prostate were obtained, and the prostate was found to have a volume of 23 cc.   Prostate was seen without any pubic arch interference.     I believe he is an excellent technical candidate for prostate seed implantation. This procedure is scheduled for May, 2019 with Dr. Lamar Blinks.     Dorothey Baseman MD

## 2017-09-09 ENCOUNTER — Ambulatory Visit: Payer: BLUE CROSS/BLUE SHIELD | Attending: Radiation Oncology

## 2017-09-09 DIAGNOSIS — C61 Malignant neoplasm of prostate: Secondary | ICD-10-CM | POA: Insufficient documentation

## 2017-09-19 ENCOUNTER — Encounter: Payer: BLUE CROSS/BLUE SHIELD | Admitting: Urology

## 2017-09-25 ENCOUNTER — Encounter: Payer: Self-pay | Admitting: Radiation Oncology

## 2017-10-01 ENCOUNTER — Other Ambulatory Visit: Payer: Self-pay | Admitting: Radiation Oncology

## 2017-10-01 ENCOUNTER — Telehealth: Payer: Self-pay

## 2017-10-01 DIAGNOSIS — C61 Malignant neoplasm of prostate: Secondary | ICD-10-CM

## 2017-10-01 NOTE — Telephone (Signed)
[facility] Pre-Operative Instructions for HDR Procedure    FOLLOW YOUR SURGEON'S INSTRUCTIONS IF DIFFERENT THAN BELOW    Five days before surgery, please STOP taking:    -- NSAIDS (Ibuprofen, Meloxicam, Aleve, etc.)  -- Vitamins and herbal supplements, including herbal teas.  -- YOU MAY TAKE ACETAMINOPHEN (TYLENOL) as needed.    -- Directions regarding your prescribed blood thinners, including aspirin, should be approved by your cardiologist or prescribing doctor.  Please consult them regarding permission to stop your blood thinner 7 days prior to surgery.    PLEASE FOLLOW THE DETAILED PRE-PROCEDURE PROTOCOL GIVEN TO YOU BY YOUR SURGEON.  PREPARATION BEGINS TWO DAYS PRIOR TO SURGERY.    PLEASE REFER TO THESE INSTRUCTIONS FOR YOUR SECOND SCHEDULED  PROCEDURE.      DAY BEFORE SURGERY    -- Call [phone] between 2PM and 4PM and to receive your arrival/surgery time.    --Call Friday afternoon if your surgery is on Monday.    -- Please arrange for transportation to and from the hospital.    ---You must have a responsible adult to stay with you after your surgery.    -- Do not eat anything after midnight the night before your procedure.   --You are encouraged to consume clear liquids such as Gatorade, clear apple juice or water up to 2 hours before surgery.      DAY OF SURGERY    Notify staff of any recent infection or signs and symptoms of infection: Cough, fever, etc.    ---DO NOT CONSUME FOOD OF ANY KIND.     --Gatorade, clear apple juice,or water are encouraged from midnight until 2 hours before your scheduled surgery.    -- DO NOT WEAR: RINGS, JEWELRY, BODY LOTION, OR SCENTS.    -- You may shower, brush your teeth, and use deodorant.    -- If wearing eyeglasses, please bring a case.    -- DO NOT WEAR CONTACT LENSES.    --Patients are permitted 2 visitors at a time. All visitors must be 3 yrs of age or older.    MEDICATIONS: DAY OF SURGERY    ---Take your medications as directed according to your printed,  verbal or MyChart instructions.  -- Anxiety and pain medications may be taken any time prior to arrival.     DIABETICS: DAY OF SURGERY    -- Do not take oral diabetes medications.  -- DO NOT TAKE ANY REGULAR OR HUMALOG INSULIN (THIS INSTRUCTION DOES NOT APPLY TO  INSULIN PUMPS).  -- Take half the dose of NPH insulin.  -- Take Lantus insulin as usual.  -- If you are feeling symptomatic, you may take sugar-containing clear liquids after midnight the night before your surgery if your glucose level is less than 70.  Please limit your intake of these clear liquids to 8 ounces, if at all possible.  If you are still feeling symptomatic, you may report to the Surgery center early and report your symptoms to the anesthesia personnel.  --Please bring backup supplies if you are currently using an insulin pump.  Thank you.    AT [place] in the Main Ramp garage.  Enter the building through the [employer].    -- Please stop at the Information Desk so that they may direct you to [facility] on [address].     [place]    If you would like to have your prescription filled prior to  leaving the hospital, your family members are welcome to pick up your prescription for you.  If you are having a late surgery, please ask your doctor or nurse about writing the prescription prior to surgery so that it may be obtained before the pharmacy closes if that is your preference.  The pharmacy is open until 5:30pm.     If you will be alone on the day of surgery and want to leave with your prescribed medication you may:             Bring a check made out to [facility]           Use a credit card to pay for your prescription           Have your family call the pharmacy with a credit card number           Only bring cash to the hospital as a last resort.           Prescriptions cannot be filled without payment.  Thank you for your consideration.    QUESTIONS?    -- Any  questions about these instructions? Call [phone], select option 2, and leave a message for a nurse to return your call.  -- Any questions regarding specifics about your surgery or recovery?  Please call your surgeon's office.

## 2017-10-02 ENCOUNTER — Encounter: Payer: Self-pay | Admitting: Radiation Oncology

## 2017-10-07 ENCOUNTER — Other Ambulatory Visit
Admission: RE | Admit: 2017-10-07 | Discharge: 2017-10-07 | Disposition: A | Discharge status: Home / Self Care | Payer: BLUE CROSS/BLUE SHIELD | Source: Ambulatory Visit | Attending: Radiation Oncology | Admitting: Radiation Oncology

## 2017-10-07 DIAGNOSIS — C61 Malignant neoplasm of prostate: Secondary | ICD-10-CM | POA: Insufficient documentation

## 2017-10-07 LAB — PSA (EFF.4-2010): PSA (eff. 4-2010): 4.18 ng/mL — ABNORMAL HIGH (ref 0.00–4.00)

## 2017-10-08 DIAGNOSIS — C61 Malignant neoplasm of prostate: Secondary | ICD-10-CM

## 2017-10-17 ENCOUNTER — Inpatient Hospital Stay
Admission: RE | Admit: 2017-10-17 | Discharge: 2017-10-17 | Disposition: A | Discharge status: Home / Self Care | Payer: BLUE CROSS/BLUE SHIELD | Source: Ambulatory Visit | Attending: Anesthesiology | Admitting: Anesthesiology

## 2017-10-17 ENCOUNTER — Telehealth: Payer: Self-pay | Admitting: Urology

## 2017-10-17 ENCOUNTER — Ambulatory Visit: Payer: BLUE CROSS/BLUE SHIELD | Admitting: Anesthesiology

## 2017-10-17 ENCOUNTER — Ambulatory Visit: Payer: BLUE CROSS/BLUE SHIELD

## 2017-10-17 ENCOUNTER — Encounter: Payer: BLUE CROSS/BLUE SHIELD | Admitting: Anesthesiology

## 2017-10-17 VITALS — BP 120/68 | HR 65 | Temp 97.7°F | Resp 14 | Ht 72.0 in | Wt 175.0 lb

## 2017-10-17 DIAGNOSIS — C61 Malignant neoplasm of prostate: Secondary | ICD-10-CM | POA: Insufficient documentation

## 2017-10-17 DIAGNOSIS — K645 Perianal venous thrombosis: Secondary | ICD-10-CM | POA: Diagnosis not present

## 2017-10-17 MED ORDER — HYDROCODONE-ACETAMINOPHEN 5-325 MG PO TABS *I*
1.0000 | ORAL_TABLET | Freq: Four times a day (QID) | ORAL | 0 refills | Status: DC | PRN
Start: 2017-10-17 — End: 2017-11-11

## 2017-10-17 MED ORDER — HALOPERIDOL LACTATE 5 MG/ML IJ SOLN *I*
0.5000 mg | Freq: Once | INTRAMUSCULAR | Status: AC | PRN
Start: 2017-10-17 — End: 2017-10-17

## 2017-10-17 MED ORDER — FENTANYL CITRATE 50 MCG/ML IJ SOLN *WRAPPED*
INTRAMUSCULAR | Status: DC | PRN
Start: 2017-10-17 — End: 2017-10-17
  Administered 2017-10-17: 75 ug via INTRAVENOUS
  Administered 2017-10-17: 50 ug via INTRAVENOUS
  Administered 2017-10-17 (×4): 25 ug via INTRAVENOUS
  Administered 2017-10-17: 50 ug via INTRAVENOUS
  Administered 2017-10-17: 25 ug via INTRAVENOUS

## 2017-10-17 MED ORDER — LACTATED RINGERS IV SOLN *I*
75.0000 mL/h | INTRAVENOUS | Status: AC
Start: 2017-10-17 — End: 2017-10-17

## 2017-10-17 MED ORDER — DEXTROSE 5 % FLUSH FOR PUMPS *I*
0.0000 mL/h | INTRAVENOUS | Status: DC | PRN
Start: 2017-10-17 — End: 2017-10-18

## 2017-10-17 MED ORDER — HYDROMORPHONE HCL PF 1 MG/ML IJ SOLN *WRAPPED*
INTRAMUSCULAR | Status: AC
Start: 2017-10-17 — End: 2017-10-17
  Filled 2017-10-17: qty 1

## 2017-10-17 MED ORDER — PHENAZOPYRIDINE HCL 100 MG PO TABS *I*
100.0000 mg | ORAL_TABLET | Freq: Three times a day (TID) | ORAL | 0 refills | Status: AC | PRN
Start: 2017-10-17 — End: 2017-10-22

## 2017-10-17 MED ORDER — ATROPINE SULFATE 1 MG/ML IJ/IV SOLN *WRAPPED*
INTRAMUSCULAR | Status: AC
Start: 2017-10-17 — End: 2017-10-17
  Filled 2017-10-17: qty 1

## 2017-10-17 MED ORDER — DEXAMETHASONE SODIUM PHOSPHATE 4 MG/ML INJ SOLN *WRAPPED*
INTRAMUSCULAR | Status: AC
Start: 2017-10-17 — End: 2017-10-17
  Filled 2017-10-17: qty 1

## 2017-10-17 MED ORDER — EPHEDRINE 5MG/ML IN NS IV/IJ *WRAPPED*
INTRAMUSCULAR | Status: DC | PRN
Start: 2017-10-17 — End: 2017-10-17
  Administered 2017-10-17: 10 mg via INTRAVENOUS

## 2017-10-17 MED ORDER — MIDAZOLAM HCL 1 MG/ML IJ SOLN *I* WRAPPED
INTRAMUSCULAR | Status: DC | PRN
Start: 2017-10-17 — End: 2017-10-17
  Administered 2017-10-17: 2 mg via INTRAVENOUS

## 2017-10-17 MED ORDER — SODIUM CHLORIDE 0.9 % FLUSH FOR PUMPS *I*
0.0000 mL/h | INTRAVENOUS | Status: DC | PRN
Start: 2017-10-17 — End: 2017-10-18

## 2017-10-17 MED ORDER — MIDAZOLAM HCL 1 MG/ML IJ SOLN *I* WRAPPED
INTRAMUSCULAR | Status: AC
Start: 2017-10-17 — End: 2017-10-17
  Filled 2017-10-17: qty 4

## 2017-10-17 MED ORDER — LIDOCAINE HCL 2 % (PF) IJ SOLN *I*
INTRAMUSCULAR | Status: AC
Start: 2017-10-17 — End: 2017-10-17
  Filled 2017-10-17: qty 5

## 2017-10-17 MED ORDER — GLYCOPYRROLATE 0.2 MG/ML IJ SOLN *WRAPPED*
INTRAMUSCULAR | Status: DC | PRN
  Administered 2017-10-17: .3 mg via INTRAVENOUS

## 2017-10-17 MED ORDER — LIDOCAINE HCL (PF) 1 % IJ SOLN *I*
INTRAMUSCULAR | Status: AC
Start: 2017-10-17 — End: 2017-10-17
  Filled 2017-10-17: qty 2

## 2017-10-17 MED ORDER — ATROPINE SULFATE 1 MG/ML IJ/IV SOLN *WRAPPED*
INTRAMUSCULAR | Status: DC | PRN
  Administered 2017-10-17: 1 mg via INTRAVENOUS

## 2017-10-17 MED ORDER — LIDOCAINE HCL 2 % IJ SOLN *I*
INTRAMUSCULAR | Status: DC | PRN
Start: 2017-10-17 — End: 2017-10-17
  Administered 2017-10-17: 60 mg via INTRAVENOUS

## 2017-10-17 MED ORDER — GLYCOPYRROLATE 0.2 MG/ML IJ SOLN *WRAPPED*
INTRAMUSCULAR | Status: AC
Start: 2017-10-17 — End: 2017-10-17
  Filled 2017-10-17: qty 2

## 2017-10-17 MED ORDER — LIDOCAINE HCL (PF) 1 % IJ SOLN *I*
0.1000 mL | INTRAMUSCULAR | Status: DC | PRN
Start: 2017-10-17 — End: 2017-10-18
  Administered 2017-10-17: 0.1 mL via SUBCUTANEOUS
  Filled 2017-10-17: qty 2

## 2017-10-17 MED ORDER — PROPOFOL 10 MG/ML IV EMUL (INTERMITTENT DOSING) WRAPPED *I*
INTRAVENOUS | Status: AC
Start: 2017-10-17 — End: 2017-10-17
  Filled 2017-10-17: qty 20

## 2017-10-17 MED ORDER — CIPROFLOXACIN IN D5W 400 MG/200ML IV SOLN *I*
400.0000 mg | Freq: Once | INTRAVENOUS | Status: AC
Start: 2017-10-17 — End: 2017-10-17
  Administered 2017-10-17: 400 mg via INTRAVENOUS
  Filled 2017-10-17: qty 200

## 2017-10-17 MED ORDER — ONDANSETRON HCL 2 MG/ML IV SOLN *I*
4.0000 mg | Freq: Once | INTRAMUSCULAR | Status: AC | PRN
Start: 2017-10-17 — End: 2017-10-17

## 2017-10-17 MED ORDER — HYDROMORPHONE HCL PF 1 MG/ML IJ SOLN *WRAPPED*
INTRAMUSCULAR | Status: DC | PRN
Start: 2017-10-17 — End: 2017-10-17
  Administered 2017-10-17: 1 mg via INTRAVENOUS

## 2017-10-17 MED ORDER — FENTANYL CITRATE 50 MCG/ML IJ SOLN *WRAPPED*
INTRAMUSCULAR | Status: AC
Start: 2017-10-17 — End: 2017-10-17
  Filled 2017-10-17: qty 6

## 2017-10-17 MED ORDER — DEXAMETHASONE SODIUM PHOSPHATE 4 MG/ML INJ SOLN *WRAPPED*
INTRAMUSCULAR | Status: DC | PRN
Start: 2017-10-17 — End: 2017-10-17
  Administered 2017-10-17: 4 mg via INTRAVENOUS

## 2017-10-17 MED ORDER — MEPERIDINE HCL 25 MG/ML IJ SOLN *I*
12.5000 mg | INTRAMUSCULAR | Status: DC | PRN
Start: 2017-10-17 — End: 2017-10-18

## 2017-10-17 MED ORDER — HEPARIN SODIUM 5000 UNIT/ML SQ *I*
5000.0000 [IU] | Freq: Once | SUBCUTANEOUS | Status: AC
Start: 2017-10-17 — End: 2017-10-17
  Administered 2017-10-17: 5000 [IU] via SUBCUTANEOUS
  Filled 2017-10-17: qty 1

## 2017-10-17 MED ORDER — EPHEDRINE 5MG/ML IN NS IV/IJ *WRAPPED*
INTRAMUSCULAR | Status: AC
Start: 2017-10-17 — End: 2017-10-17
  Filled 2017-10-17: qty 5

## 2017-10-17 MED ORDER — HYDROMORPHONE HCL PF 0.5 MG/0.5 ML IJ SOLN *I*
0.5000 mg | INTRAMUSCULAR | Status: AC | PRN
Start: 2017-10-17 — End: 2017-10-17

## 2017-10-17 MED ORDER — TAMSULOSIN HCL 0.4 MG PO CAPS *I*
0.4000 mg | ORAL_CAPSULE | Freq: Every evening | ORAL | 2 refills | Status: DC
Start: 2017-10-17 — End: 2018-04-08

## 2017-10-17 MED ORDER — LACTATED RINGERS IV SOLN *I*
20.0000 mL/h | INTRAVENOUS | Status: DC
Start: 2017-10-17 — End: 2017-10-18
  Administered 2017-10-17: 20 mL/h via INTRAVENOUS

## 2017-10-17 MED ORDER — SODIUM CHLORIDE 0.9 % IV SOLN WRAPPED *I*
20.0000 mL/h | Status: DC
Start: 2017-10-17 — End: 2017-10-18

## 2017-10-17 MED ORDER — PHENAZOPYRIDINE HCL 100 MG PO TABS *I*
200.0000 mg | ORAL_TABLET | Freq: Once | ORAL | Status: AC
Start: 2017-10-17 — End: 2017-10-17

## 2017-10-17 MED ORDER — PROPOFOL 10 MG/ML IV EMUL (INTERMITTENT DOSING) WRAPPED *I*
INTRAVENOUS | Status: DC | PRN
Start: 2017-10-17 — End: 2017-10-17
  Administered 2017-10-17: 200 mg via INTRAVENOUS

## 2017-10-17 MED ORDER — CIPROFLOXACIN HCL 500 MG PO TABS *I*
500.0000 mg | ORAL_TABLET | Freq: Two times a day (BID) | ORAL | 0 refills | Status: AC
Start: 2017-10-17 — End: 2017-10-21

## 2017-10-17 MED ORDER — CIPROFLOXACIN IN D5W 400 MG/200ML IV SOLN *I*
INTRAVENOUS | Status: AC
Start: 2017-10-17 — End: 2017-10-17
  Filled 2017-10-17: qty 200

## 2017-10-17 MED ORDER — ALBUTEROL SULFATE (2.5 MG/3ML) 0.083% IN NEBU *I*
2.5000 mg | INHALATION_SOLUTION | Freq: Once | RESPIRATORY_TRACT | Status: AC | PRN
Start: 2017-10-17 — End: 2017-10-17

## 2017-10-17 NOTE — Anesthesia Preprocedure Evaluation (Addendum)
Anesthesia Pre-operative History and Physical for Timothy Cross  History and Physical Performed at [facility] ([place])      .  CPM Summary:  Timothy Cross presents preoperatively for anesthesia evaluation prior to first HDR, no personal or FHX of anesthesia problems, has answered no to all peri op ekg screening questions,. He  has a past medical history of Anxiety; Arthritis; Cancer; and Colon polyp.By Johnella Moloney, PA at 7:25 AM on 10/17/2017    Anesthesia Evaluation Information Source: patient, records unavailable     ANESTHESIA HISTORY     Denies anesthesia history  Pertinent(-):  No History of anesthetic complications or Family hx of anesthetic complications    GENERAL     Denies general issues  Pertinent (-):  No substance abuse    HEENT     Denies HEENT issues  Pertinent (-):  No neck pain PULMONARY  Pertinent(-):  No smoking, shortness of breath, recent URI, cough/congestion or sleep apnea    CARDIOVASCULAR  Excellent(10+METs) Exercise Tolerance  Pertinent(-):  No cardiac testing, angina or orthopnea    Comment: Can do yard work, chores and climb one flight of stairs without SOB    GI/HEPATIC/RENAL  Last PO Intake: >8hr before procedure and >2hr before procedure (clears)  Pertinent(-):  No nausea, vomiting, liver  issues, bowel issues or urinary issues NEURO/PSYCH  Pertinent(-):  No dizziness/motion sickness, syncope, seizures or cerebrovascular event    ENDO/OTHER     Denies endo issues  Pertinent(-):  No diabetes mellitus, thyroid disease    HEMATOLOGIC    + Arthritis          hips  Pertinent(-):  No bruising/bleeding easily       Physical Exam    Airway            Mouth opening: normal            Mallampati: II            TM distance (fb): >3 FB            TM distance (cm): 4            Neck ROM: limited            Facial hair: beard, mustache  Dental   Normal Exam   Cardiovascular  Normal Exam           Rhythm: regular           Rate: normal  No carotid bruit        General Survey    No rashes    Pulmonary   Normal Exam    breath sounds clear to auscultation    No cough, rhonchi, decreased breath sounds, wheezes, rales    Mental Status   Normal Exam    oriented to person, place and time       ________________________________________________________________________  PLAN  ASA Score  2  Anesthetic Plan general     Induction (routine IV) General Anesthesia/Sedation Maintenance Plan (inhaled agents and IV bolus); Airway (LMA); Line ( use current access); Monitoring (standard ASA); Positioning (lithotomy and arms out); PONV Plan (dexamethasone and ondansetron); Pain (per surgical team); PostOp (PACU)    Informed Consent     Risks:          Risks discussed were commensurate with the plan listed above with the following specific points: N/V, aspiration and sore throat , damage to:(teeth), allergic Rx, unexpected serious injury    Anesthetic Consent:  Anesthetic plan (and risks as noted above) were discussed with patient and spouse    Attending Attestation:  As the primary attending anesthesiologist, I attest that the patient or proxy understands and accepts the risks and benefits of the anesthesia plan. I also attest that I have personally performed a pre-anesthetic examination and evaluation, and prescribed the anesthetic plan for this particular location within 48 hours prior to the anesthetic as documented. Bosie Clos, MD 7:53 AM

## 2017-10-17 NOTE — Letter (Signed)
PATIENT NAME:  [name], [name]  DATE OF SERVICE:  10/17/2017  MRN:  [id number]  CSN:  [id number]  Page 2    [address] [place], [place] [id number]  Laqueta Due, Brooke Bonito., [phone]  Joanette Gula, [phone]  [place], [place]  PhoneJohn [address] [place], [phone]     Oct 17, 2017    Lonie Peak, MD   [place], [place] [address]    RE:   [name], [name]  DOB:  [birth year]  Unit#:  [id number]  CSN:  [id number]    Dear Dr. Gilford Rile:     This is just a brief followup on your patient, Timothy Cross, who, as you know has recently diagnosed clinical stage cT1c Gleason 7 adenocarcinoma of the prostate and elected to undergo prostate seed implant brachytherapy.  Today, we implanted 125-iodine seeds into the prostate and felt we had excellent seed distribution.  At the end of the case, there was really no bleeding from the urethra or prostatic urethra.  No seeds could be seen in the bladder or any mucosal defects.  He will be discharged from our Ambulatory Surgery Unit with a Foley catheter in place, to return to see [place] again in 3 to 4 days for catheter removal.  He will be instructed on routine postprocedural care otherwise.       Again, thank you very much for allowing [place] to be involved in the care of your patient.  We will, of course, keep you informed of all urological follow-up.     Sincerely,           Rexene Alberts, MD, FACS    EMM/MODL  DD:  10/17/2017 10:03:08  DT:  10/17/2017 11:57:24  Job #:  [id number]    cc:  Lonie Peak, MD                Dorothey Baseman, MD  [address] [place]   [place], [place] [address]

## 2017-10-17 NOTE — Op Note (Signed)
Timothy Cross, Timothy Cross MR #:  [id number]   CSN:  [id number] DOB:  1960-07-25    AGE:  57     SURGEON:  Rexene Alberts, MD, FACS  CO-SURGEON:    ASSISTANT:    SURGERY DATE:  10/17/2017    OPERATIVE PROCEDURE:  Prostate seed implant and low-dose brachytherapy.    RADIATION ONCOLOGIST: Dorothey Baseman, MD.    ANESTHESIA:  General LMA.    COMPLICATIONS TO SURGERY:  None.    TUBES LEFT IN:  16-French 2-way Foley balloon blown up to 10 mL.    PROCEDURE ITSELF:  The patient was prepped and draped in the usual manner, lying in the modified exaggerated dorsal lithotomy position on the operating table after the induction of general LMA anesthesia.  An 18Fr foley catheter was passed and drained clear urine. After capturing images of the prostate with a 7 millihertz transrectal ultrasonic probe, the treatment plan, which was already formulated, was followed closely, although modified a little bit.  Through a total of 15 needles, 50 seeds of 125 iodine were delivered into the prostate.  We felt we had excellent seed distribution at the end.     The 18-French Foley catheter  was removed.  A 15.5-French flexible cystoscope was then passed per urethra into the bladder.  The urethra was unremarkable, and there were no signs of seeds or needles in the bladder or urethra.  The bladder was examined in meridian fashion with the scope, and there were no mucosal abnormalities.  The cystoscope was withdrawn, again confirming the absence of seeds within the urethra, prostatic urethra, and then a 16-French Foley catheter passed and balloon blown up to 10 mL.  The patient was taken to the recovery room in stable condition.    PREOPERATIVE DIAGNOSIS:  Prostate cancer             ______________________________  Rexene Alberts, MD, FACS    EMM/MODL  DD:  10/17/2017 09:59:24  DT:  10/17/2017 10:54:43  Job #:  [id number]    cc:

## 2017-10-17 NOTE — H&P (Signed)
UPDATES TO PATIENT'S CONDITION on the DAY OF SURGERY/PROCEDURE    I. Updates to Patient's Condition (to be completed by a provider privileged to complete a H&P, following reassessment of the patient by the provider):    Day of Surgery/Procedure Update:  History  Full H&P done today; no updates needed.    Physical  Full H&P done today; no updates needed.            II. Procedure Readiness   I have reviewed the patient's H&P and updated condition. By completing and signing this form, I attest that this patient is ready for surgery/procedure.    III. Attestation   I have reviewed the updated information regarding the patient's condition and it is appropriate to proceed with the planned surgery/procedure.    Dorothey Baseman, MD as of 8:20 AM 10/17/2017

## 2017-10-17 NOTE — Anesthesia Case Conclusion (Signed)
CASE CONCLUSION  Emergence  Actions:  Soft bite block and LMA removed  Criteria Used for Airway Removal:  Adequate Tv & RR, acceptable O2 saturation and following commands  Assessment:  Routine  Transport  Directly to: PACU  Position:  Upright  Patient Condition on Handoff  Level of Consciousness:  Mildly sedated  Patient Condition:  Stable  Handoff Report to:  RN

## 2017-10-17 NOTE — Progress Notes (Addendum)
Radiation Oncology Prostate Seed Implantation (PSI) Procedure Note (10/17/2017):     Diagnosis: Prostate Cancer, Favorable Intermediate Risk, T1c, Gleason 3+4 in 1 core, 3+3 in 2 cores, PSA 4.18    Procedure: Radioactive seed implantation of the prostate using ultrasound guidance and perineal template. The prescribed dose is 145 Gy using iodine-125 stranded seeds.    Date of Procedure: 10/17/2017     Urologist: Meriam Sprague. Lamar Blinks, MD  Radiation Oncologist: Dorothey Baseman, MD  Radiation Physicist: Kermit Balo, M.S.    Previous External Radiation Therapy: None  Previous Hormonal Therapy: None    Description of Procedure: The patient's name and date of birth were verified. The prescription and plan once again had been double checked. Under general anesthesia the patient was placed in the dorsolithotomy position and the perineum was prepped and draped in the usual manner. A Foley catheter was placed into the bladder and the scrotum was secured out of the field. Aerated lubricating jelly was injected in the Foley catheter. An ultrasound probe was placed into the rectum and the probe was attached and secured onto the operating table. The patient was positioned in a fashion closely reproducing that of the pre-implantation ultrasound volume study. The prostate measured ~22 cc. A template was positioned against the perineum and 2 stabilization needles were inserted into the prostate. A total of 50 seeds of iodine-125 were implanted into the prostate using 15 needles according to the plan generated based on the volume study ultrasound with minor adjustments as needed. The position of each needle in relationship to the base of the prostate and a template grid was confirmed by the urologist and radiation oncologist. Following seed placement, the ultrasound images were reviewed and revealed a very good seed distribution. Cystoscopy was performed revealing no seeds within the bladder or any significant bladder/urethral pathology.  Estimated blood loss was minimal and the patient was transported to the recovery room and discharged in good condition. The radiation exposure reading at one meter from the patient was 0.2 mRem/hr.    Disposition: A urinary catheter was left in place and instructions on its use was provided by urology. Radiation precautions were reviewed with the family, and a radiation safety identification card was given to the patient. The patient will receive appointment to follow up in one month in radiation oncology for post-brachytherapy CT scan for analysis of the dosimetry of the radioactive implant. He will have subsequent periodic follow-up appointments to monitor side effects by urology and radiation oncology. He knows to contact us should he have any questions or concerns.    I was present throughout the entire procedure.    Dorothey Baseman, MD

## 2017-10-17 NOTE — Telephone Encounter (Signed)
Santiago Glad from [facility] is calling to set up a NPV with Robb Matar per Dr. Lockie Pares orders. Santiago Glad states he needs a Voiding Trial and Cath change on 10/21/17- Writer viewed nothing prior to June. Please reach him at [phone]. If necessary Santiago Glad can be reached at [phone]

## 2017-10-17 NOTE — Anesthesia Postprocedure Evaluation (Signed)
Anesthesia Post-Op Note    Patient: Timothy Cross    Procedure(s) Performed:  Procedure Summary  Date:  10/17/2017 Anesthesia Start: 10/17/2017  8:18 AM Anesthesia Stop: 10/17/2017 10:12 AM Room / Location:  * No operating room entered * / [place]   * No procedures listed * Diagnosis:  * No pre-op diagnosis entered * * No surgeons listed * Attending Anesthesiologist:  Bosie Clos, MD         Recovery Vitals  BP: 120/68 (10/17/2017 11:00 AM)  Heart Rate: 65 (10/17/2017 11:00 AM)  Resp: 14 (10/17/2017 11:00 AM)  Temp: 36.5 C (97.7 F) (10/17/2017 10:12 AM)  SpO2: 100 % (10/17/2017 11:00 AM)   0-10 Scale: 0 (10/17/2017 11:00 AM)  Anesthesia type:  General  Complications Noted During Procedure or in PACU:  None   Comment:    Patient Location:  PACU  Level of Consciousness:    Awake  Patient Participation:     Able to participate  Oxygen Saturation:    Appropriate for condition  Cardiac Status:   Appropriate for condition and stable  Fluid Status:    Stable  Airway Patency:     Yes  Pulmonary Status:    Stable  Pain Management:    Adequate analgesia  Nausea and Vomiting:  None    Post Op Assessment:    No evidence of recall and tolerated procedure well   Attending Attestation:  All indicated post anesthesia care provided     -

## 2017-10-17 NOTE — Anesthesia Procedure Notes (Signed)
---------------------------------------------------------------------------------------------------------------------------------------    AIRWAY   GENERAL INFORMATION AND STAFF    Patient location during procedure: OR       Date of Procedure: 10/17/2017 8:31 AM  CONDITION PRIOR TO MANIPULATION     Current Airway/Neck Condition:  Normal        For more airway physical exam details, see Anesthesia PreOp Evaluation  AIRWAY METHOD     Patient Position:  Sniffing    Preoxygenated: yes      Induction: IV  Mask Difficulty Assessment:  0 - not attempted    Number of Attempts at Approach:  1  FINAL AIRWAY DETAILS    Final Airway Type:  LMA    Final LMA: classic    LMA Size: 4  ----------------------------------------------------------------------------------------------------------------------------------------

## 2017-10-17 NOTE — H&P (Addendum)
Radiation Oncology New Patient Consultation     Diagnosis: Prostate Cancer, Favorable Intermediate Risk, T1c, Gleason 3+4 in 1 core, 3+3 in 2 cores, PSA 4.18    Oncologic History:   03/2017 - Messing: originally saw him because of a minimally lumpy prostate and a fairly low PSA with no voiding symptoms.  Unfortunately, his PSA has risen up to 3.61 with 8% free.  This is up from 2.29 with 9% free 2-1/2 years ago, and 1.97 nearly 3 years ago.    06/10/17 - TRUS and biopsy ([facility])              20 cc prostate              RA: 3+3 in 5% of 1 /2 cores              RM: normal              RB: normal              LA: 3+4 (20% 4) in 20-40% of 1 /2 cores              LM: 3+3 in 5% of 1 /2 cores              LB: normal  06/27/17 - CT pelvis: 1. Known prostate cancer. Prostate gland is mildly enlarged and heterogeneous.  2. No lymphadenopathy.  06/27/17 - NM Bone Scan: No scintigraphic evidence of osseous metastatic disease    Feeling well. No significant urinary symptoms.   No urgency, weak stream, incomplete emptying, or dysuria.   No bowel symptoms.   Intact erectile function.     ROS: On complete review of systems, he denies any fevers, chills, fatigue, poor appetite, nausea, vomiting, diarrhea, constipation.    No Known Allergies (drug, envir, food or latex)  Current Outpatient Prescriptions   Medication    ibuprofen (ADVIL,MOTRIN) 200 MG tablet    acetaminophen (TYLENOL) 325 MG tablet     Current Facility-Administered Medications   Medication Dose Route Frequency    sodium chloride 0.9 % FLUSH REQUIRED IF PATIENT HAS IV  0-500 mL/hr Intravenous PRN    dextrose 5 % FLUSH REQUIRED IF PATIENT HAS IV  0-500 mL/hr Intravenous PRN    Lactated Ringers Infusion  20 mL/hr Intravenous Continuous    sodium chloride 0.9% IV  20 mL/hr Intravenous Continuous    lidocaine PF 1 % injection 0.1 mL  0.1 mL Subcutaneous PRN    ciprofloxacin (CIPRO) IVPB 400 mg  400 mg Intravenous Once    fentaNYL (SUBLIMAZE) 50 mcg/mL injection         propofol (DIPRIVAN) 10 mg/mL injection        midazolam (VERSED) 1 mg/mL injection        ciprofloxacin (CIPRO) 400 MG/200ML IVPB        lidocaine PF 1 % injection        Lactated Ringers Infusion  75 mL/hr Intravenous Continuous    HYDROmorphone (DILAUDID) injection 0.5 mg  0.5 mg Intravenous Q5 Min PRN    ondansetron (ZOFRAN) injection 4 mg  4 mg Intravenous Once PRN    haloperidol lactate (HALDOL) injection 0.5 mg  0.5 mg Intravenous Once PRN    albuterol (PROVENTIL) nebulization 2.5 mg  2.5 mg Nebulization Once PRN    meperidine (DEMEROL) injection 12.5 mg  12.5 mg Intravenous PRN     Past Medical History:   Diagnosis Date    Anxiety     Arthritis  hip    Cancer     Colon polyp      Social History   Substance Use Topics    Smoking status: Never Smoker    Smokeless tobacco: Never Used    Alcohol use Yes     1 Glasses of wine, 4 Cans of beer per week      Comment: 3-4 drinks a week      family history includes Cancer in his brother and mother; Conversion in an other family member.    Physical Exam:   Well developed, well nourished gentleman in no acute distress.  Very pleasant and cooperative.  BP 119/69 (BP Location: Left arm)    Pulse 60    Temp 36.3 C (97.3 F) (Temporal)    Resp 16    Ht 182.9 cm (6')    Wt 79.4 kg (175 lb)    SpO2 98%    BMI 23.73 kg/m . KPS 90%. He has no palpable supraclavicular lymphadenopathy. His heart is in regular rate and rhythm with no murmurs, rubs, or gallops. Lungs are clear to auscultation bilaterally.     Laboratory data:   Lab Results   Component Value Date    PSAR3 4.18 (H) 10/07/2017    PSAR3 3.73 04/12/2017    PSAR3 3.61 03/15/2017     Imaging data:  Bone Scan and CT scan as above.     Impression:   Timothy Cross is a very pleasant 57 y.o. man with favorable intermediate risk prostate cancer, who presents for permanent prostate seed implantation.     Plan:   Proceed with permanent prostate seed implantation (I-125, 145 Gy) today.    Instructions given for catheter removal, and radiation precautions. Informed consent obtained.     Dorothey Baseman MD

## 2017-10-17 NOTE — Patient Instructions (Addendum)
You have just received moderate sedation.  Do not drive, operate machinery or make any important decisions for twelve  hours after your procedure.          The seeds placed in your prostate consist of Iodine 125 radioactive metal.      You will need to follow a few precautions:   There are no restrictions around adults   I have been advised to use a condom for my first 2 sexual encounters   I understand and agree to keep a distance of 6 feet or more away from young children and pregnant women.   Small children should not ist on my lap for more than 5 minutes per day.   These precautions should be followed for the first 3 months following the procedure.   If a seed passes while urinating, do not attempt to retrieve it. Flush in down the toilet and flush twice.     Medications   You will take antibiotics for 4 days after your procedure   For discomfort with urination you may take AZO, an over the counter medication to help bladder/voiding discomfort.     You may take advil/motrin for discomfort      Catheter Removal      You will receive a call from Dr. Lamar Blinks office for an appointment for catheter removal and voiding trial.        Important things to remember     Drink plenty of fluids to keep the urine clear   You may experience some loose stools. If this occurs switch to a bland diet and take imodium if necessary   You may experience burning or blood in your urine with urination. Increasing your fluid intake and taking pyridium will help.    Use an ice pack on the perineal area for no more than 15 minutes every hour or 2.       You will be seen in one month for a follow up visit and a CT scan to evaluate seed placement. Your appointment is 11/19/2017 at 9:30.

## 2017-10-18 NOTE — Telephone Encounter (Signed)
Patient added to schedule for Monday.

## 2017-10-18 NOTE — Telephone Encounter (Signed)
Patient is calling to check on the status of his follow up appointment with Dr. Lamar Blinks.    Please return his call at: [phone]

## 2017-10-18 NOTE — Telephone Encounter (Signed)
Timothy Cross is calling to check on the status of the appt for the voiding trial.      He can be reached at [phone]

## 2017-10-21 ENCOUNTER — Encounter: Payer: Self-pay | Admitting: Urology

## 2017-10-21 ENCOUNTER — Ambulatory Visit: Payer: BLUE CROSS/BLUE SHIELD | Attending: Urology | Admitting: Urology

## 2017-10-21 VITALS — BP 133/62 | HR 73 | Temp 97.7°F | Ht 72.0 in | Wt 175.0 lb

## 2017-10-21 DIAGNOSIS — C61 Malignant neoplasm of prostate: Secondary | ICD-10-CM

## 2017-10-21 LAB — POCT BLADDER SCAN PVR: Residual mL: 11

## 2017-10-21 NOTE — Progress Notes (Signed)
Voiding Trial    Patient has a catheter due to post-op.    Bladder filled with sterile NS 240 ml.  Patient voided - yes  Amount - 250 ml  Residual - 11 ml.  Foley inserted - No  Return to clinic -    Patient reminded to drink plenty of fluids and if he is unable to void within a six hour time frame to come back to clinic for catheter insertion. Patient cleared for discharge by Robb Matar NP.    Lovie Chol, LPN  \[id number]   54:65 AM

## 2017-11-11 ENCOUNTER — Ambulatory Visit: Payer: BLUE CROSS/BLUE SHIELD | Attending: Urology | Admitting: Urology

## 2017-11-11 ENCOUNTER — Encounter: Payer: Self-pay | Admitting: Urology

## 2017-11-11 VITALS — BP 127/65 | HR 55 | Temp 97.7°F | Ht 72.0 in | Wt 175.0 lb

## 2017-11-11 DIAGNOSIS — R972 Elevated prostate specific antigen [PSA]: Secondary | ICD-10-CM

## 2017-11-11 DIAGNOSIS — C61 Malignant neoplasm of prostate: Secondary | ICD-10-CM

## 2017-11-11 LAB — POCT SIEMENS URINALYSIS DIPSTICK
Glucose,UA POCT: NEGATIVE mg/dL
Ketones,UA POCT: NEGATIVE mg/dL
Kit Lot: 805057
Leukocyte,UA POCT: NEGATIVE
Nitrite,UA POCT: NEGATIVE
pH,UA POCT: 6.5 (ref 5.0–8.0)

## 2017-11-11 NOTE — Patient Instructions (Signed)
Wean off the Flomax to every other day.  If urination worsens let us know to call in a new prescription.

## 2017-11-11 NOTE — Progress Notes (Signed)
Chief complaint: Post op follow up    History of present illness: This 57 year old Timothy Cross has a history of stage T1c, Gleason 7 adenocarcinoma of the prostate underwent Iodine-125 seed implant 10/17/17.  The Timothy Cross was seen in follow-up today reports he is doing well.  He had his Foley catheter removed 3 days after the procedure and reports he is voiding without difficulty.  He continues on Flomax once a day.  He does have a sense of urgency, and or frequency than previously.  He states he usually is able to sleep through the night but occasionally will get up once.  He denies any diarrhea, but states he feels he has more flatus.    Medications:   Current Outpatient Prescriptions   Medication    ibuprofen (ADVIL,MOTRIN) 800 MG tablet    tamsulosin (FLOMAX) 0.4 MG capsule    acetaminophen (TYLENOL) 325 MG tablet     No current facility-administered medications for this visit.        Allergies: No Known Allergies (drug, envir, food or latex)    Past medical history:   Past Medical History:   Diagnosis Date    Anxiety     Arthritis     hip    Cancer     Colon polyp        Past surgical history:   Past Surgical History:   Procedure Laterality Date    HERNIA REPAIR      INGUINAL HERNIA REPAIR      KNEE SURGERY Bilateral     arthroscopy ACL reconstruction    PR LAP, VENTRAL HERNIA REPAIR,REDUCIBLE N/A 10/29/2016    Procedure:  UMBILICAL HERNIA REPAIR;  Surgeon: Suszanne Finch, MD;  Location: [facility]IN OR;  Service: General    PR [place] Bilateral 10/29/2016    Procedure:  LAPAROSCOPIC BILATERAL  INGUINAL HERNIA REPAIR with mesh;  Surgeon: Suszanne Finch, MD;  Location: [facility];  Service: General    UMBILICAL [place]      Surgery Of Male Genitalia Vasectomy Conversion Data        Family history:   Family History   Problem Relation Age of Onset    Conversion Other         [id number]^Large Intestine Cancer^[email or web address].Anger    Cancer Mother     Cancer Brother         Social History:   Social History     Social History    Marital status: Married     Spouse name: N/A    Number of children: N/A    Years of education: N/A     Occupational History    Not on file.     Social History Main Topics    Smoking status: Never Smoker    Smokeless tobacco: Never Used    Alcohol use Yes     1 Glasses of wine, 4 Cans of beer per week      Comment: 3-4 drinks a week     Drug use: No    Sexual activity: Yes     Partners: Female     Birth control/ protection: None      Comment: vasectomy     Social History Narrative    No narrative on file         REVIEW of SYSTEMS    Constitutional: Negative.  HEENT: Negative  Respiratory: Negative  Cardiac: Negative  GI: Negative  GU: See above.  Musculoskeletal: arthritis  in hips  Neurological: Negative  Hematological: Negative  Behavorial: Negative  Skin: Negative  Endocrine:Negative  Vascular:Negative    Vital signs: Blood pressure 127/65, pulse 55, temperature 36.5 C (97.7 F), height 1.829 m (6'), weight 79.4 kg (175 lb).    Physical examination:  Recent Results (from the past 24 hour(s))   POCT [facility] urinalysis dipstick    Collection Time: 11/11/17  9:06 AM   Result Value Ref Range    Glucose,UA POCT Negative Negative mg/dL    Ketones,UA POCT Negative Negative mg/dL    Specific gravity,UA POCT N/A 1.002 - 1.030    Blood,UA POCT Trace (!) Negative    pH,UA POCT 6.5 5.0 - 8.0    Protein,UA POCT Trace (!) Negative mg/dL    Nitrite,UA POCT Negative Negative    Leukocyte,UA POCT Negative Negative    Expiration Date 04/2018     Kit Lot [id number]        Assessment: This Timothy Cross has a history of recent 7 adenocarcinoma prostate underwent brachytherapy approximately 3 weeks ago.  He reports no voiding difficulties.    Plan: The Timothy Cross will try to wean himself off the Flomax.  He finds he is having more voiding difficulty and we will call in a prescription.  He has a follow-up appointment with Dr.Bylund next week.  We will see the Timothy Cross back again  in 3 months getting a PSA prior to that visit.          Scheryl Marten, NP 11/11/2017 9:38 AM

## 2017-11-19 ENCOUNTER — Ambulatory Visit: Payer: BLUE CROSS/BLUE SHIELD | Attending: Radiation Oncology | Admitting: Radiation Oncology

## 2017-11-19 ENCOUNTER — Encounter: Payer: BLUE CROSS/BLUE SHIELD | Admitting: Radiation Oncology

## 2017-11-19 VITALS — BP 113/68 | HR 53 | Temp 97.7°F | Resp 16 | Ht 72.01 in | Wt 173.5 lb

## 2017-11-19 DIAGNOSIS — C61 Malignant neoplasm of prostate: Secondary | ICD-10-CM

## 2017-11-19 NOTE — Progress Notes (Signed)
Radiation Oncology Follow-up Note Prostate Cancer (s/p Definitive Radiation Therapy)     Diagnosis: Prostate Cancer, Favorable Intermediate Risk, T1c, Gleason 3+4 in 1 core, 3+3 in 2 cores, PSA 4.18    Oncologic History:   03/2017 - [facility]: minimally lumpy prostate and a fairly low PSA with no voiding symptoms.  Unfortunately, his PSA has risen up to 3.61 with 8% free.  This is up from 2.29 with 9% free 2-1/2 years ago, and 1.97 nearly 3 years ago.     06/10/17 - TRUS and biopsy ([facility])              20 cc prostate              RA: 3+3 in 5% of 1 /2 cores              RM: normal              RB: normal              LA: 3+4 (20% 4) in 20-40% of 1 /2 cores              LM: 3+3 in 5% of 1 /2 cores              LB: normal  06/27/17 - CT pelvis: 1. Known prostate cancer. Prostate gland is mildly enlarged and heterogeneous.  2. No lymphadenopathy.  06/27/17 - NM Bone Scan: No scintigraphic evidence of osseous metastatic disease  10/07/17 -  PSA 4.18.   10/17/17 - Permanent Prostate Seed Implant Brachytherapy - 145 Gy, using Iodine 125 seeds.     Interval History:   GU Symptoms:  He has minimal if any urinary symptoms. Occasional urinary frequency and feeling of post-void dribbling. He was taking Flomax once per day, but last week cut it back to once every other day, at the suggestion of Robb Matar  No difficulty with erections.   GI Symptoms: Occasional bowel frequency. No diarrhea or constipation.   Constitutional Symptoms: Good energy level. No pain.     Complete Review of Symptoms: He denies any areas of pain, fatigue, poor appetite, nausea, vomiting, diarrhea, constipation, weight loss, fevers, sweats, headaches, cough, shortness of breath, or mood changes.     Current Medications:   Current Outpatient Prescriptions   Medication    ibuprofen (ADVIL,MOTRIN) 800 MG tablet    acetaminophen (TYLENOL) 325 MG tablet    tamsulosin (FLOMAX) 0.4 MG capsule     No current facility-administered medications for this visit.         Physical Exam:   Well developed, well nourished gentleman in no acute distress.  Very pleasant and cooperative.  BP 113/68 (BP Location: Right arm, Patient Position: Sitting, Cuff Size: adult)    Pulse 53    Temp 36.5 C (97.7 F) (Temporal)    Resp 16    Ht 182.9 cm (6' 0.01")    Wt 78.7 kg (173 lb 8 oz)    SpO2 99%    BMI 23.53 kg/m . KPS 90%. He has no palpable supraclavicular or cervical, lymphadenopathy. His heart is in regular rate and rhythm with no murmurs, rubs, or gallops. Lungs are clear to auscultation bilaterally.     Laboratory data:   Lab Results   Component Value Date    PSAR3 4.18 (H) 10/07/2017    PSAR3 3.73 04/12/2017    PSAR3 3.61 03/15/2017     Impression:   Timothy Cross is now 1 month status post  treatment for his favorable intermediate-risk prostate cancer.   He has mostly recovered from increased urinary symptoms from radiation. I told him to try stopping his Flomax at this point, as long as his urinary symptoms remain are under good control.     He also underwent focused CT scan of the prostate today, after our visit, for post-implant quality assurance and dosimetry. He tolerated this well. Seed distribution in the prostate appeared grossly adequate. A full dosimetric report will follow.     Plan:   Timothy Cross will see Dr. Lamar Blinks in Sept 2019, with labs prior.   We will follow with [name] in Dec 2019 time, with a PSA obtained before that time.    Dorothey Baseman MD

## 2017-11-22 ENCOUNTER — Ambulatory Visit: Payer: BLUE CROSS/BLUE SHIELD | Attending: Primary Care | Admitting: Primary Care

## 2017-11-22 ENCOUNTER — Encounter: Payer: Self-pay | Admitting: Primary Care

## 2017-11-22 VITALS — BP 128/68 | HR 97 | Temp 97.3°F | Ht 72.01 in | Wt 172.0 lb

## 2017-11-22 DIAGNOSIS — M5417 Radiculopathy, lumbosacral region: Secondary | ICD-10-CM

## 2017-11-22 NOTE — Patient Instructions (Signed)
Patient Education   Lower Back Exercises   WHAT YOU NEED TO KNOW:   Lower back exercises help heal and strengthen your back muscles to prevent another injury. Ask your healthcare provider if you need to see a physical therapist for more advanced exercises.   DISCHARGE INSTRUCTIONS:   Return to the emergency department if:   · You have severe pain that prevents you from moving.       Contact your healthcare provider if:   · Your pain becomes worse.    · You have new pain.    · You have questions or concerns about your condition or care.    Do lower back exercises safely:   · Do the exercises on a mat or firm surface  (not on a bed) to support your spine and prevent low back pain.     · Move slowly and smoothly.  Avoid fast or jerky motions.     · Breathe normally.  Do not hold your breath.     · Stop if you feel pain.  It is normal to feel some discomfort at first. Regular exercise will help decrease your discomfort over time.    Lower back exercises:  Your healthcare provider may recommend that you do back exercises 10 to 30 minutes each day. He may also recommend that you do exercises 1 to 3 times each day. Ask your healthcare provider which exercises are best for you and how often to do them.  · Ankle pumps:  Lie on your back. Move your foot up (with your toes pointing toward your head). Then, move your foot down (with your toes pointing away from you). Repeat this exercise 10 times on each side.         · Heel slides:  Lie on your back. Slowly bend one leg and then straighten it. Next, bend the other leg and then straighten it. Repeat 10 times on each side.         · Pelvic tilt:  Lie on your back with your knees bent and feet flat on the floor. Place your arms in a relaxed position beside your body. Tighten the muscles of your abdomen and flatten your back against the floor. Hold for 5 seconds. Repeat 5 times.         · Back stretch:  Lie on your back with your hands behind your head. Bend your knees and turn  the lower half of your body to one side. Hold this position for 10 seconds. Repeat 3 times on each side.         · Straight leg raises:  Lie on your back with one leg straight. Bend the other knee. Tighten your abdomen and then slowly lift the straight leg up about 6 to 12 inches off the floor. Hold for 1 to 5 seconds. Lower your leg slowly. Repeat 10 times on each leg.         · Knee-to-chest:  Lie on your back with your knees bent and feet flat on the floor. Pull one of your knees toward your chest and hold it there for 5 seconds. Return your leg to the starting position. Lift the other knee toward your chest and hold for 5 seconds. Do this 5 times on each side.         · Cat and camel:  Place your hands and knees on the floor. Arch your back upward toward the ceiling and lower your head. Round out your spine   as much as you can. Hold for 5 seconds. Lift your head upward and push your chest downward toward the floor. Hold for 5 seconds. Do 3 sets or as directed.         · Wall squats:  Stand with your back against a wall. Tighten the muscles of your abdomen. Slowly lower your body until your knees are bent at a 45 degree angle. Hold this position for 5 seconds. Slowly move back up to a standing position. Repeat 10 times.         · Curl up:  Lie on your back with your knees bent and feet flat on the floor. Place your hands, palms down, underneath the curve in your lower back. Next, with your elbows on the floor, lift your shoulders and chest 2 to 3 inches. Keep your head in line with your shoulders. Hold this position for 5 seconds. When you can do this exercise without pain for 10 to 15 seconds, you may add a rotation. While your shoulders and chest are lifted off the ground, turn slightly to the left and hold. Repeat on the other side.         · Bird dog:  Place your hands and knees on the floor. Keep your wrists directly below your shoulders and your knees directly below your hips. Pull your belly button in  toward your spine. Do not flatten or arch your back. Tighten your abdominal muscles. Raise one arm straight out so that it is aligned with your head. Next, raise the leg opposite your arm. Hold this position for 15 seconds. Lower your arm and leg slowly and change sides. Do 5 sets.       © Copyright [employer] 2019 Information is for End User's use only and may not be sold, redistributed or otherwise used for commercial purposes. All illustrations and images included in CareNotes® are the copyrighted property of A.D.A.M., Inc. or [employer]  The above information is an educational aid only. It is not intended as medical advice for individual conditions or treatments. Talk to your doctor, nurse or pharmacist before following any medical regimen to see if it is safe and effective for you.

## 2017-11-22 NOTE — Progress Notes (Signed)
SUBJECTIVE: Patient presents for left leg pain.      Left leg pain  - 2 weeks had pain to a lesser degree  - in the buttock and radiating down his leg  - has tingling sensation, more than numbness  - Wednesday felt better  - yesterday evening, felt pain way worse, had difficulty sleeping  - constant 7 - 8/10  - over the last several hours, pain has decreased  - took a couple motrin this morning, took 2 motrin last night did not notice a difference  - 2 weeks ago developed on a weekend, cleaning in the garden, no specific incident  - he does have right hip arthritis that has not been as bothersome as his left leg pain  - denies any bowel/bladder incontinence, fevers, chills, or night sweats    I reviewed the medications with the patient in E-record and no changes made. I have reviewed and updated Allergies.     CONSTITUTIONAL: Vitals noted.  GENERAL: in no apparent distress.  DERM: Warm, no petechiae, no jaundice, dry  EYES: Anicteric with normal conjunctiva. Pupils equally round.  ENMT: Normal pinnae. Acuity to conversational tones is good. Moist mucous membranes.   CHEST:  No accessory muscle usage. Respirations unlabored.  EXT: No point tenderness in the lumbosacral spine. Tightness/stretch reproduced with left SLR. No sensory changes. Reflexes symmetric.      ASSESSMENT/PLAN: Mr. Groninger is a 57 year old male presenting for lumbosacral radiculopathy.     - He will trial home exercises/stretches first. If not improving, may consider formal PT referral. He will continue PRN motrin.    Follow-up as indicated above.    Lonie Peak, MD  11/22/2017

## 2017-12-09 ENCOUNTER — Ambulatory Visit: Payer: BLUE CROSS/BLUE SHIELD | Attending: Radiation Oncology

## 2017-12-09 DIAGNOSIS — C61 Malignant neoplasm of prostate: Secondary | ICD-10-CM | POA: Insufficient documentation

## 2017-12-23 ENCOUNTER — Ambulatory Visit: Payer: BLUE CROSS/BLUE SHIELD

## 2017-12-23 DIAGNOSIS — C61 Malignant neoplasm of prostate: Secondary | ICD-10-CM

## 2018-01-27 DIAGNOSIS — Z125 Encounter for screening for malignant neoplasm of prostate: Secondary | ICD-10-CM | POA: Diagnosis not present

## 2018-01-27 DIAGNOSIS — Z0000 Encounter for general adult medical examination without abnormal findings: Secondary | ICD-10-CM | POA: Diagnosis not present

## 2018-01-27 DIAGNOSIS — Z13220 Encounter for screening for lipoid disorders: Secondary | ICD-10-CM | POA: Diagnosis not present

## 2018-01-29 DIAGNOSIS — Z0000 Encounter for general adult medical examination without abnormal findings: Secondary | ICD-10-CM | POA: Diagnosis not present

## 2018-02-07 ENCOUNTER — Other Ambulatory Visit
Admission: RE | Admit: 2018-02-07 | Discharge: 2018-02-07 | Disposition: A | Discharge status: Home / Self Care | Payer: BLUE CROSS/BLUE SHIELD | Source: Ambulatory Visit | Attending: Urology | Admitting: Urology

## 2018-02-07 DIAGNOSIS — C61 Malignant neoplasm of prostate: Secondary | ICD-10-CM | POA: Insufficient documentation

## 2018-02-07 LAB — PSA (EFF.4-2010): PSA (eff. 4-2010): 1.79 ng/mL (ref 0.00–4.00)

## 2018-02-07 LAB — TESTOSTERONE: Testosterone: 583 ng/dL (ref 193–740)

## 2018-02-13 ENCOUNTER — Ambulatory Visit: Payer: BLUE CROSS/BLUE SHIELD | Admitting: Urology

## 2018-04-08 ENCOUNTER — Ambulatory Visit: Payer: BLUE CROSS/BLUE SHIELD | Attending: Primary Care | Admitting: Primary Care

## 2018-04-08 ENCOUNTER — Encounter: Payer: Self-pay | Admitting: Primary Care

## 2018-04-08 VITALS — BP 130/74 | Temp 97.0°F | Ht 73.0 in | Wt 155.3 lb

## 2018-04-08 DIAGNOSIS — G8929 Other chronic pain: Secondary | ICD-10-CM

## 2018-04-08 DIAGNOSIS — M159 Polyosteoarthritis, unspecified: Secondary | ICD-10-CM

## 2018-04-08 DIAGNOSIS — Z0000 Encounter for general adult medical examination without abnormal findings: Secondary | ICD-10-CM

## 2018-04-08 DIAGNOSIS — Z9889 Other specified postprocedural states: Secondary | ICD-10-CM

## 2018-04-08 DIAGNOSIS — M25551 Pain in right hip: Secondary | ICD-10-CM

## 2018-04-08 DIAGNOSIS — R1031 Right lower quadrant pain: Secondary | ICD-10-CM

## 2018-04-08 DIAGNOSIS — M150 Primary generalized (osteo)arthritis: Secondary | ICD-10-CM

## 2018-04-08 DIAGNOSIS — M549 Dorsalgia, unspecified: Secondary | ICD-10-CM

## 2018-04-08 DIAGNOSIS — Z23 Encounter for immunization: Secondary | ICD-10-CM

## 2018-04-08 DIAGNOSIS — R1032 Left lower quadrant pain: Secondary | ICD-10-CM

## 2018-04-08 DIAGNOSIS — Z8546 Personal history of malignant neoplasm of prostate: Secondary | ICD-10-CM

## 2018-04-08 DIAGNOSIS — Z8719 Personal history of other diseases of the digestive system: Secondary | ICD-10-CM

## 2018-04-08 DIAGNOSIS — R0781 Pleurodynia: Secondary | ICD-10-CM

## 2018-04-08 DIAGNOSIS — M25552 Pain in left hip: Secondary | ICD-10-CM

## 2018-04-08 LAB — PCMH DEPRESSION ASSESSMENT

## 2018-04-08 NOTE — Progress Notes (Signed)
Reason for visit:   CC: Timothy Cross is 57 y.o. year old male coming in for a preventive health care physical  HPI:     His wife passed about 2 weeks ago. He cared for her while she was in hospice. She passed away from cancer. He has been grieving her loss. His feelings are familiar due to losing his children 6 years ago. He does have family support. He has returned to work part-time.     Bilateral groin pain: About a year ago, he had bilateral inguinal hernia repairs with mesh. Not long after his surgery, he started to have recurrent inguinal pain. He has noticed some bulging at the area of previous inguinal hernias. While caring for his wife, he was lifting. He has lost a considerable amount of weight over the past several months as he was caring for his wife.     Back pain: He experiences nearly daily pain in his upper back and lower back that feels like strain. He will occasionally take motrin for pain. Previous imaging of his back was notable for degenerative changes.     Bilateral hip pain, right greater than left: Most prominent when he is walking. Pain is daily. Prior imaging notable for degenerative changes. He was doing exercises which he stopped due to inguinal hernia repairs.     Rib pain: Since losing weight, he noticed pain beneath his ribs. Denies any bowel changes, nausea or vomiting.     Prostate Cancer s/p brachytherapy: Denies any urinary symptoms, has follow-up scheduled with Radiation Oncology and Urology.     Colon due 2020    Patient Active Problem List    Diagnosis Date Noted    Prostate cancer 06/17/2017    Elevated PSA 04/08/2017    Inguinal hernia bilateral, non-recurrent 03/06/2016    Prostate nodule without urinary obstruction 03/11/2014    ACL tear 02/27/2013    Personal history of colonic polyps 02/27/2013    Hyperlipidemia 12/10/2006     Created by Conversion        Osteoarthritis 12/10/2006     Created by Conversion         Medications:     Current Outpatient  Medications   Medication Sig    ibuprofen (ADVIL,MOTRIN) 800 MG tablet Take 800 mg by mouth 3 times daily as needed for Pain    acetaminophen (TYLENOL) 325 MG tablet Take 650 mg by mouth every 6 hours as needed for Pain     No current facility-administered medications for this visit.      Medication list reconciled this visit  Allergies:   No Known Allergies (drug, envir, food or latex)  Immunizations:     Most Recent Immunizations   Administered Date(s) Administered    Hep B/HiB (Comvax) 04/17/2013    Hepatitis A Adult 02/24/2013    IPV 02/24/2013    Influenza Adult(30yr and up) 02/28/2012    Influenza PF(3 year and up) 02/24/2013    Influenza Quadrivalent 0.89mL prefilled syringe/single dose vial(FluLaval,Fluzone,Afluria,Fluarix) 04/08/2018    Influenza cell-based prefilled syringe (Flucelvax)>/=16yrs 04/08/2017    Tdap 02/24/2013    Typhoid Inactivated 11/24/2012     Past Medical History:     Past Medical History:   Diagnosis Date    Anxiety     Arthritis     hip    Cancer     Colon polyp       Past Surgical History:   Procedure Laterality Date    HERNIA REPAIR  INGUINAL HERNIA REPAIR      KNEE SURGERY Bilateral     arthroscopy ACL reconstruction    PR LAP, VENTRAL HERNIA REPAIR,REDUCIBLE N/A 10/29/2016    Procedure:  UMBILICAL HERNIA REPAIR;  Surgeon: Suszanne Finch, MD;  Location: [facility] MAIN OR;  Service: General    PR [place] Bilateral 10/29/2016    Procedure:  LAPAROSCOPIC BILATERAL  INGUINAL HERNIA REPAIR with mesh;  Surgeon: Suszanne Finch, MD;  Location: [facility];  Service: General    UMBILICAL HERNIA REPAIR      VASECTOMY      Surgery Of Male Genitalia Vasectomy Conversion Data        Family History:     Family History   Problem Relation Age of Onset    Conversion Other         [id number]^Large Intestine Cancer^V16.0^Active^    Cancer Mother     Cancer Brother       Social History      Social History     Tobacco Use    Smoking status: Never Smoker     Smokeless tobacco: Never Used   Substance Use Topics    Alcohol use: Yes     Types: 1 Glasses of wine, 4 Cans of beer per week     Comment: 3-4 drinks a week        PMH/PSH/FH/SH all reviewed with pt and updated as appropriate  Review of Systems   Constitutional: Feels generally well; no fevers, night sweats or unintentional weight loss.  Eyes: No visual changes, no eye pain  ENT: No hearing difficulties, no ear pain, no sore throat, UTD with twice yearly dental visits  CV: No chest pain, palpitations, orthopnea, or leg swelling  Respiratory: No cough, wheezing or dyspnea  GI: No nausea/vomiting, abdominal pain, or change in bowel habits  GU: No dysuria, no change in urinary function.    MS: No joint pain or swelling, no axial problems   Skin: No rashes or new/changing skin lesions  Neuro: No headaches; no problems with speech or cognition or memory, no arm or leg weakness, no numbness or tingling  Psych: No problems with depression or anxiety   Endocrine: No polyuria or polydipsia, no heat or cold intolerance  Heme/lymph: No easy bleeding or bruising; no swollen glands  All/Immun: No allergic reactions  Other: Diet-healthy;  regular exercise-yes; seat belt-wears;  recreational drugs-not using;  hx abuse (sexual, physical, verbal)-no;  hx STD's-no    Physical Exam:     Vitals:    04/08/18 0827   BP: 130/74   Temp: 36.1 C (97 F)   TempSrc: Temporal   Weight: 70.4 kg (155 lb 4.8 oz)   Height: 1.854 m (6\' 1" )     Wt Readings from Last 3 Encounters:   04/08/18 70.4 kg (155 lb 4.8 oz)   11/22/17 78 kg (172 lb)   11/19/17 78.7 kg (173 lb 8 oz)     BP Readings from Last 3 Encounters:   04/08/18 130/74   11/22/17 128/68   11/19/17 113/68       Vital Signs. As above  General: Appears healthy, comfortable, pleasant.  Skin: Normal texture, no rash or suspicious lesions.    Head: Atraumatic.    Eyes: Lids appear normal, no conjunctival injection or icterus.  PERRL;EOMI without nystagmus.   Irises normal.   ENT: Inspection  reveals normal appearing ears and nose.  Otoscopic exam shows clean external auditory canals; tympanic membranes shiny and  normal.  Hearing is intact to normal conversational tones.  Nostrils patent.  Lips and gums are normal and teeth in adequate repair.  Mucous membranes are moist. No oral lesions; pharynx without redness or exudate.    Neck: Supple, no masses, trachea is midline.  No thyroid enlargement, tenderness, or nodules.   Chest wall:  Symmetric.    Breasts: No masses or nipple inversion  Resp: Normal respiratory effort. Excellent air entry.  Normal vesicular breath sounds without rales or wheezing.    CV: Apical impulse normal.  Regular rhythm.  Normal S1/S2.  No murmur/rub/gallop.  Carotid pulses full and without bruits.  Abdominal aorta does not feel enlarged, no bruits.  Pedal pulses intact.  No edema.    GI: Nondistended.  BS normoactive.  Soft, no masses or tenderness.  No hepatosplenomegaly.   GU: Bilateral inguinal bulge with pain to palpation. Rectal exam deferred.   Lymphatic:  No cervical, supraclavicular, axillary, or inguinal adenopathy.    MSK:  Normal gait and station.  Atraumatic.  Thoracic and lumbar paraspinal pain. Right hip decreased ROM with abduction/external rotation. No swelling oints, muscles without atrophy.    Neuro:  Cranial nerves II-XII intact. No focal deficits of motor or sensory system.  DTR's symmetric.   Psych: Alert, Ox3.  Insight and judgment seem normal.  Mood/affect normal. Recent and remote memory intact.    RESULTS:     Labs reviewed prior to visit.    EKG: NSR, HR ~ 60, normal axis, no acute ST segment changes, similar to prior    ASSESSMENT/PLAN:     Preventive health care:   --Health care proxy discussed  --Aerobic exercise and diet discussed  --Colon CA screening discussed  --Immunizations reviewed and discussed  --STD risk counseling and prevention discussed  --Skin CA awareness/prevention discussed  --Dental care discussed  --Cardiac risk factor modification  discussed  --Motor vehicle safety discussed    Health Maintenance   Topic Date Due    HIV TESTING OFFERED  01/22/1974    IMM-ZOSTER (1 of 2) 01/23/2011    IMM-INFLUENZA (1) 02/09/2018    LIPID DISORDER SCREENING  03/15/2018    Colon Cancer Screening Other  03/25/2019    HEPATITIS C SCREENING OFFERED  Completed       Leona was seen today for annual exam and new patient visit.    Diagnoses and all orders for this visit:    Annual physical exam  -     EKG 12 lead  -     Lipid add Rfx to Drt LDL if Trig >400; Future  -     Comprehensive metabolic panel; Future  -     TSH; Future  -     CBC and differential; Future  -     T4, free; Future  -     Hemoglobin A1c; Future    Immunization due  -     Flu vaccine quadrivalent greater than or equal to 3yo preservative free IM    History of prostate cancer        - Follow-up as scheduled with Urology, Radiation Oncology    Chronic back pain         - Likely secondary to degenerative changes, given exercises    Bilateral hip pain         - Degenerative changes, given exercises    Rib pain          - Prominence of ribs s/p weight loss    Inguinal pain  of both sides s/p bilateral inguinal hernia repair with mesh  -     CT abdomen and pelvis without contrast to further evaluate integrity of mesh    Handouts given: AVS  Follow-up:    RTO 1 year    Lonie Peak, MD  04/08/2018

## 2018-04-08 NOTE — Patient Instructions (Signed)
Patient Education   Core Strengthening Exercises   WHAT YOU NEED TO KNOW:   Your core includes the muscles of your lower back, hip, pelvis, and abdomen. Core strengthening exercises help heal and strengthen these muscles. This helps prevent another injury, and keeps your pelvis, spine, and hips in the correct position.   DISCHARGE INSTRUCTIONS:   Contact your healthcare provider if:    You have sharp or worsening pain during exercise or at rest.     You have questions or concerns about your shoulder exercises.    Safety tips:  Talk to your healthcare provider before you start an exercise program. A physical therapist can teach you how to do core strengthening exercises safely.   Do the exercises on a mat or firm surface.  A firm surface will support your spine and prevent low back pain. Do not do these exercises on a bed.      Move slowly and smoothly.  Avoid fast or jerky motions.      Stop if you feel pain.  Core exercises should not be painful. Stop if you feel pain.      Breathe normally during core exercises.  Do not hold your breath. This may cause an increase in blood pressure and prevent muscle strengthening. Your healthcare provider will tell you when to inhale and exhale during the exercise.      Begin all of your exercises with abdominal bracing.  Abdominal bracing helps warm up your core muscles. You can also practice abdominal bracing throughout the day. Lie on your back with your knees bent and feet flat on the floor. Place your arms in a relaxed position beside your body. Tighten your abdominal muscles. Pull your belly button in and up toward your spine. Hold for 5 seconds. Relax your muscles. Repeat 10 times.       Core strengthening exercises:  Your healthcare provider will tell you how often to do these exercises. The provider will also tell you how many repetitions of each exercise you should do. Hold each exercise for 5 seconds or as directed. As you get stronger, increase your hold to  10 to 15 seconds. You can do some of these exercises on a stability ball, or with a weight. Ask your healthcare provider how to use a stability ball or weight for these exercises:   Bridging:  Lie on your back with your knees bent and feet flat on the floor. Rest your arms at your side. Tighten your buttocks, and then lift your hips 1 inch off the floor. Hold for 5 seconds. When you can do this exercise without pain for 10 seconds, increase the distance you lift your hips. A good goal is to be able to lift your hips so that your shoulders, hips, and knees are in a straight line.          Dead bug:  Lie on your back with your knees bent and feet flat on the floor. Place your arms in a relaxed position beside your body. Begin with abdominal bracing. Next, raise one leg, keeping your knee bent. Hold for 5 seconds. Repeat with the other leg. When you can do this exercise without pain for 10 to 15 seconds, you may raise one straight leg and hold. Repeat with the other leg.          Quadruped:  Place your hands and knees on the floor. Keep your wrists directly below your shoulders and your knees directly below your hips. Pull  your belly button in toward your spine. Do not flatten or arch your back. Tighten your abdominal muscles below your belly button. Hold for 5 seconds. When you can do this exercise without pain for 10 to 15 seconds, you may extend one arm and hold. Repeat on the other side.          Side bridge exercises:      ? Standing side bridge:  Stand next to a wall and extend one arm toward the wall. Place your palm flat on the wall with your fingers pointing upward. Begin with abdominal bracing. Next, without moving your feet, slowly bend your arm to 90 degrees. Hold for 5 seconds. Repeat on the other side. When you can do this exercise without pain for 10 to 15 seconds, you may do the bent leg side bridge on the floor.         ? Bent leg side bridge:  Lie on one side with your legs, hips, and shoulders in  a straight line. Prop yourself up onto your forearm so your elbow is directly below your shoulder. Bend your knees back to 90 degrees. Begin with abdominal bracing. Next, lift your hips and balance yourself on your forearm and knees. Hold for 5 seconds. Repeat on the other side. When you can do this exercise without pain for 10 to 15 seconds, you may do the straight leg side bridge on the floor.         ? Straight leg side bridge:  Lie on one side with your legs, hips, and shoulders in a straight line. Prop yourself up onto your forearm so your elbow is directly below your shoulder. Begin with abdominal bracing. Lift your hips off the floor and balance yourself on your forearm and the outside of your flexed foot. Do not let your ankle bend sideways. Hold for 5 seconds. Repeat on the other side. When you can do this exercise without pain for 10 to 15 seconds, ask your healthcare provider for more advanced exercises.        Superman:  Lie on your stomach. Extend your arms forward on the floor. Tighten your abdominal muscles and lift your right hand and left leg off the floor. Hold this position. Slowly return to the starting position. Tighten your abdominal muscles and lift your left hand and right leg off the floor. Hold this position. Slowly return to the starting position.          Clam:  Lie on your side with your knees bent. Put your bottom arm under your head to keep your neck in line. Put your top hand on your hip to keep your pelvis from moving. Put your heels together, and keep them together during this exercise. Slowly raise your top knee toward the ceiling. Then lower your leg so your knees are together. Repeat this exercise 10 times. Then switch sides and do the exercise 10 times with the other leg.          Curl up:  Lie on your back with your knees bent and feet flat on the floor. Place your hands, palms down, underneath your lower back. Next, with your elbows on the floor, lift your shoulders and  chest 2 to 3 inches off the floor. Keep your head in line with your shoulders. Hold this position. Slowly return to the starting position.          Straight leg raises:  Lie on your back with one leg straight. [employer]  the other knee and place your foot flat on the floor. Tighten your abdominal muscles. Keep your leg straight and slowly lift it straight up 6 to 12 inches off the floor. Hold this position. Lower your leg slowly. Do as many repetitions as directed on this side. Repeat with the other leg.          Plank:  Lie on your stomach. Bend your elbows and place your forearms flat on the floor. Lift your chest, stomach, and knees off the floor. Make sure your elbows are below your shoulders. Your body should be in a straight line. Do not let your hips or lower back sink to the ground. Squeeze your abdominal muscles together and hold for 15 seconds. To make this exercise harder, hold for 30 seconds or lift 1 leg at a time.          Bicycles:  Lie on your back. Bend both knees and bring them toward your chest. Your calves should be parallel to the floor. Place the palms of your hands on the back of your head. Straighten your right leg and keep it lifted 2 inches off the floor. Raise your head and shoulders off the floor and twist towards your left. Keep your head and shoulders lifted. Bend your right knee while you straighten your left leg. Keep your left leg 2 inches off the floor. Twist your head and chest towards the left leg. Continue to straighten 1 leg at a time and twist.       Follow up with your healthcare provider as directed:  Write down your questions so you remember to ask them during your visits.    Copyright [employer] 2019 Information is for [employer] use only and may not be sold, redistributed or otherwise used for commercial purposes. All illustrations and images included in CareNotes are the copyrighted property of [employer]M., Inc[employer] or [place]  The above information is an  educational aid only. It is not intended as medical advice for individual conditions or treatments. Talk to your doctor, nurse or pharmacist before following any medical regimen to see if it is safe and effective for you.       Patient Education   Hip Sprain   WHAT YOU NEED TO KNOW:   A hip sprain is when a ligament in your hip is stretched or torn. Ligaments (tough tissue that connects bones) surround your hip and hold it in place.  DISCHARGE INSTRUCTIONS:   Medicines:    NSAIDs  help decrease swelling, pain, and fever. This medicine is available with or without a doctor's order. NSAIDs can cause stomach bleeding or kidney problems in certain people. If you take blood thinner medicine, always ask your healthcare provider if NSAIDs are safe for you. Always read the medicine label and follow directions.     Take your medicine as directed.  Contact your healthcare provider if you think your medicine is not helping or if you have side effects. Tell him if you are allergic to any medicine. Keep a list of the medicines, vitamins, and herbs you take. Include the amounts, and when and why you take them. Bring the list or the pill bottles to follow-up visits. Carry your medicine list with you in case of an emergency.    Follow up with your healthcare provider as directed:  Write down any questions you have so you remember to ask them in your follow-up visits.  How to care for your hip sprain:  Rest  your hip for 2 to 3 days after your injury. This will help decrease the risk of more damage to your hip.     Apply ice  on your hip for 15 to 20 minutes every hour or as directed. Use an ice pack, or put crushed ice in a plastic bag. Cover it with a towel. Ice helps prevent tissue damage and decreases swelling and pain. You may need to apply ice to your hip at least 4 to 8 times each day for the first 48 hours.    How to exercise your hip:  After you rest your hip for about 3 days, your healthcare provider may want you to  exercise the hip to decrease stiffness. He may also have you start physical therapy (PT). A physical therapist teaches you light exercises to help decrease pain and swelling and improve hip movement. Once you are able to move your hip without pain, you will be taught exercises to improve your strength. If you have a severe sprain, you may need to wait 1 to 3 weeks before you exercise your hip.   How to prevent another injury:  Ask your healthcare provider when you can return to your normal activities. The following can help decrease your risk for sprains:   Do not exercise when you feel pain or you are tired.     Eat healthy foods. This can help keep your muscles strong. Ask about the best meal plan for you.     Exercise daily. Make sure you warm up and stretch before you exercise.     Wear equipment to protect yourself when you play sports.     Wear shoes that fit well to decrease your risk for falls.     Stop exercising and playing sports if your symptoms from a past injury return.    Contact your healthcare provider if:    You cannot stand on the leg on your injured side.     You have new or increased stiffness or trouble moving your injured hip.     You have new or increased numbness in the leg on your injured side.     You have increased swelling and pain in your hip.     The leg on your injured side looks bluish or pale.     You have questions or concerns about your condition or care.     Copyright [employer] 2019 Information is for [employer] use only and may not be sold, redistributed or otherwise used for commercial purposes. All illustrations and images included in CareNotes are the copyrighted property of [employer]M., Inc[employer] or [place]  The above information is an educational aid only. It is not intended as medical advice for individual conditions or treatments. Talk to your doctor, nurse or pharmacist before following any medical regimen to see if it is safe and effective for  you.

## 2018-04-24 ENCOUNTER — Other Ambulatory Visit: Payer: BLUE CROSS/BLUE SHIELD

## 2018-05-01 ENCOUNTER — Encounter: Payer: Self-pay | Admitting: Urology

## 2018-05-01 ENCOUNTER — Ambulatory Visit: Payer: BLUE CROSS/BLUE SHIELD | Attending: Urology | Admitting: Urology

## 2018-05-01 VITALS — BP 119/66 | HR 59 | Temp 98.2°F | Ht 72.0 in | Wt 155.0 lb

## 2018-05-01 DIAGNOSIS — C61 Malignant neoplasm of prostate: Secondary | ICD-10-CM

## 2018-05-01 DIAGNOSIS — R972 Elevated prostate specific antigen [PSA]: Secondary | ICD-10-CM

## 2018-05-01 LAB — POCT URINALYSIS DIPSTICK
Blood,UA POCT: NEGATIVE
Glucose,UA POCT: NORMAL mg/dL
Ketones,UA POCT: NEGATIVE mg/dL
Leuk Esterase,UA POCT: NEGATIVE
Lot #: 36253602
Nitrite,UA POCT: NEGATIVE
PH,UA POCT: 6 (ref 5–8)
Protein,UA POCT: NEGATIVE mg/dL

## 2018-05-01 NOTE — Letter (Signed)
PATIENT NAME:  Timothy Cross, Timothy Cross  DATE OF SERVICE:  05/01/2018  MRN:  [id number]  CSN:  [id number]  Page 2  [address] [place], [place]  [id number]     May 01, 2018    Lonie Peak, MD   [address]   [place], [place]  [address]   Fax:  [phone]    RE:   Timothy Cross, Timothy Cross  DOB:  Nov 05, 1960  Unit#:  [id number]  CSN:  [id number]    Dear Dr. Gilford Rile:     This is just a brief followup on the patient, Timothy Cross, who, as you know, had Gleason 7/10 adenocarcinoma of the prostate that was stage cT1c and elected to undergo prostate seed implant brachytherapy, which was performed on May 9th, with 125 iodine seeds.  He is recovering nicely and feeling well.  His PSA 3 months after the procedure had dropped from 4.18 before the procedure to 1.79.  He has not gotten labs yet but will get them in a few weeks before he sees Dorothey Baseman.  Exam today of node-bearing areas, abdomen, external genitalia, phallus, hernia canals, and rectum is normal, except he seems to have bilateral recurrent inguinal hernias, which are actually a little more prominent on the left than right.  He has had herniorrhaphies in the past.  He has no rectal masses and his prostate is quite flat.  His dipstick urinalysis is negative.  Assuming that his PSA continues to descend, we will just see him again in 3 months, getting labs before that visit.  I am sure Lennette Bihari will want to see him in 6 months after the appointment he has in mid December.       Again, thank you very much for allowing [place] to be involved in the care of your patient.  We will, of course, keep you informed of all urological follow-up.     Sincerely,           Rexene Alberts, MD, FACS    EMM/MODL  DD:  05/01/2018 16:07:12  DT:  05/01/2018 16:25:56  Job #:  [id number]    cc:  Lonie Peak, MD   [address], [address] [place], [place] [address]     Dorothey Baseman, MD   [address]    [place],  [address]

## 2018-05-01 NOTE — Progress Notes (Signed)
Dictated Note

## 2018-05-12 ENCOUNTER — Ambulatory Visit
Admission: RE | Admit: 2018-05-12 | Discharge: 2018-05-12 | Disposition: A | Discharge status: Home / Self Care | Payer: BLUE CROSS/BLUE SHIELD | Source: Ambulatory Visit

## 2018-05-12 DIAGNOSIS — R1031 Right lower quadrant pain: Secondary | ICD-10-CM

## 2018-05-12 DIAGNOSIS — Z9889 Other specified postprocedural states: Secondary | ICD-10-CM

## 2018-05-12 DIAGNOSIS — N200 Calculus of kidney: Secondary | ICD-10-CM

## 2018-05-12 DIAGNOSIS — R1032 Left lower quadrant pain: Secondary | ICD-10-CM

## 2018-05-12 DIAGNOSIS — R93422 Abnormal radiologic findings on diagnostic imaging of left kidney: Secondary | ICD-10-CM

## 2018-05-12 DIAGNOSIS — R93421 Abnormal radiologic findings on diagnostic imaging of right kidney: Secondary | ICD-10-CM

## 2018-05-12 DIAGNOSIS — Z8719 Personal history of other diseases of the digestive system: Secondary | ICD-10-CM

## 2018-05-12 DIAGNOSIS — Z8546 Personal history of malignant neoplasm of prostate: Secondary | ICD-10-CM

## 2018-05-12 MED ORDER — STERILE WATER FOR IRRIGATION IR SOLN *I*
971.0000 mL | Freq: Once | Status: AC
Start: 2018-05-12 — End: 2018-05-12
  Administered 2018-05-12: 971 mL via ORAL

## 2018-05-14 ENCOUNTER — Other Ambulatory Visit
Admission: RE | Admit: 2018-05-14 | Discharge: 2018-05-14 | Disposition: A | Discharge status: Home / Self Care | Payer: BLUE CROSS/BLUE SHIELD | Source: Ambulatory Visit | Attending: Primary Care | Admitting: Primary Care

## 2018-05-14 ENCOUNTER — Other Ambulatory Visit: Payer: Self-pay | Admitting: Primary Care

## 2018-05-14 DIAGNOSIS — C61 Malignant neoplasm of prostate: Secondary | ICD-10-CM

## 2018-05-14 DIAGNOSIS — Z0000 Encounter for general adult medical examination without abnormal findings: Secondary | ICD-10-CM | POA: Insufficient documentation

## 2018-05-14 DIAGNOSIS — N289 Disorder of kidney and ureter, unspecified: Secondary | ICD-10-CM

## 2018-05-14 LAB — LIPID PANEL
Chol/HDL Ratio: 2.6
Cholesterol: 184 mg/dL
HDL: 71 mg/dL — ABNORMAL HIGH (ref 40–60)
LDL Calculated: 101 mg/dL
Non HDL Cholesterol: 113 mg/dL
Triglycerides: 58 mg/dL

## 2018-05-14 LAB — COMPREHENSIVE METABOLIC PANEL
ALT: 16 U/L (ref 0–50)
AST: 17 U/L (ref 0–50)
Albumin: 4.4 g/dL (ref 3.5–5.2)
Alk Phos: 55 U/L (ref 40–130)
Anion Gap: 11 (ref 7–16)
Bilirubin,Total: 0.5 mg/dL (ref 0.0–1.2)
CO2: 29 mmol/L — ABNORMAL HIGH (ref 20–28)
Calcium: 9.4 mg/dL (ref 8.6–10.2)
Chloride: 103 mmol/L (ref 96–108)
Creatinine: 1.05 mg/dL (ref 0.67–1.17)
GFR,Black: 90 *
GFR,Caucasian: 78 *
Glucose: 84 mg/dL (ref 60–99)
Lab: 18 mg/dL (ref 6–20)
Potassium: 3.8 mmol/L (ref 3.3–5.1)
Sodium: 143 mmol/L (ref 133–145)
Total Protein: 6.8 g/dL (ref 6.3–7.7)

## 2018-05-14 LAB — CBC AND DIFFERENTIAL
Baso # K/uL: 0.1 10*3/uL (ref 0.0–0.1)
Basophil %: 0.8 %
Eos # K/uL: 0.3 10*3/uL (ref 0.0–0.5)
Eosinophil %: 4.4 %
Hematocrit: 44 % (ref 40–51)
Hemoglobin: 14.2 g/dL (ref 13.7–17.5)
IMM Granulocytes #: 0 10*3/uL
IMM Granulocytes: 0.3 %
Lymph # K/uL: 1.4 10*3/uL (ref 1.3–3.6)
Lymphocyte %: 22.9 %
MCH: 29 pg/cell (ref 26–32)
MCHC: 32 g/dL (ref 32–37)
MCV: 89 fL (ref 79–92)
Mono # K/uL: 0.6 10*3/uL (ref 0.3–0.8)
Monocyte %: 9.1 %
Neut # K/uL: 3.9 10*3/uL (ref 1.8–5.4)
Nucl RBC # K/uL: 0 10*3/uL (ref 0.0–0.0)
Nucl RBC %: 0 /100 WBC (ref 0.0–0.2)
Platelets: 184 10*3/uL (ref 150–330)
RBC: 4.9 MIL/uL (ref 4.6–6.1)
RDW: 13.2 % (ref 11.6–14.4)
Seg Neut %: 62.5 %
WBC: 6.2 10*3/uL (ref 4.2–9.1)

## 2018-05-14 LAB — MULTIPLE ORDERING DOCS

## 2018-05-14 LAB — PSA (EFF.4-2010): PSA (eff. 4-2010): 1.38 ng/mL (ref 0.00–4.00)

## 2018-05-14 LAB — TSH: TSH: 1.62 u[IU]/mL (ref 0.27–4.20)

## 2018-05-14 LAB — T4, FREE: Free T4: 1.2 ng/dL (ref 0.9–1.7)

## 2018-05-15 LAB — HEMOGLOBIN A1C: Hemoglobin A1C: 5.3 %

## 2018-05-15 NOTE — Progress Notes (Signed)
[place] appointment scheduled on 05/22/2018 at 11:00 AM at [facility] for 6 hours. Made patient aware.

## 2018-05-22 ENCOUNTER — Ambulatory Visit
Admission: RE | Admit: 2018-05-22 | Discharge: 2018-05-22 | Disposition: A | Discharge status: Home / Self Care | Payer: BLUE CROSS/BLUE SHIELD | Source: Ambulatory Visit

## 2018-05-22 ENCOUNTER — Encounter: Payer: Self-pay | Admitting: Primary Care

## 2018-05-22 ENCOUNTER — Ambulatory Visit: Payer: BLUE CROSS/BLUE SHIELD | Admitting: Radiation Oncology

## 2018-05-22 DIAGNOSIS — N281 Cyst of kidney, acquired: Secondary | ICD-10-CM

## 2018-05-22 DIAGNOSIS — N200 Calculus of kidney: Secondary | ICD-10-CM

## 2018-05-22 DIAGNOSIS — N289 Disorder of kidney and ureter, unspecified: Secondary | ICD-10-CM

## 2018-05-23 ENCOUNTER — Ambulatory Visit: Payer: BLUE CROSS/BLUE SHIELD | Admitting: Radiation Oncology

## 2018-05-29 ENCOUNTER — Ambulatory Visit: Payer: BLUE CROSS/BLUE SHIELD | Attending: Radiation Oncology | Admitting: Radiation Oncology

## 2018-05-29 VITALS — BP 126/70 | HR 57 | Temp 98.1°F | Resp 16 | Ht 72.99 in | Wt 157.7 lb

## 2018-05-29 DIAGNOSIS — C61 Malignant neoplasm of prostate: Secondary | ICD-10-CM

## 2018-05-30 ENCOUNTER — Encounter: Payer: Self-pay | Admitting: Radiation Oncology

## 2018-05-30 NOTE — Progress Notes (Signed)
Radiation Oncology Follow-up Note Prostate Cancer (s/p Definitive Radiation Therapy)     Diagnosis: Prostate Cancer, Favorable Intermediate Risk, T1c, Gleason 3+4 in 1 core, 3+3 in 2 cores, PSA 4.18    Oncologic History:   03/2017 - Messing: minimally lumpy prostate and a fairly low PSA with no voiding symptoms.  Unfortunately, his PSA has risen up to 3.61 with 8% free.  This is up from 2.29 with 9% free 2-1/2 years ago, and 1.97 nearly 3 years ago.     06/10/17 - TRUS and biopsy ([facility])              20 cc prostate              RA: 3+3 in 5% of 1 /2 cores              RM: normal              RB: normal              LA: 3+4 (20% 4) in 20-40% of 1 /2 cores              LM: 3+3 in 5% of 1 /2 cores              LB: normal  06/27/17 - CT pelvis: 1. Known prostate cancer. Prostate gland is mildly enlarged and heterogeneous.  2. No lymphadenopathy.  06/27/17 - NM Bone Scan: No scintigraphic evidence of osseous metastatic disease  10/07/17 -  PSA 4.18.   10/17/17 - Permanent Prostate Seed Implant Brachytherapy - 145 Gy, using Iodine 125 seeds.     Interval History:   GU Symptoms:  He has occasional urinary frequency and feeling of post-void dribbling. He has stopped taking Flomax. This is not   No significant difficulty with erections.   GI Symptoms: No bowel symptoms. No diarrhea or constipation.   Constitutional Symptoms: Good energy level. No pain.     Complete Review of Symptoms: He denies any areas of pain, fatigue, poor appetite, nausea, vomiting, diarrhea, constipation, weight loss, fevers, sweats, headaches, cough, shortness of breath, or mood changes.     Current Medications:   Current Outpatient Medications   Medication    ibuprofen (ADVIL,MOTRIN) 800 MG tablet    acetaminophen (TYLENOL) 325 MG tablet     No current facility-administered medications for this visit.      Physical Exam:   Well developed, well nourished gentleman in no acute distress.  Very pleasant and cooperative.  BP 126/70 (BP Location: Left arm,  Patient Position: Sitting, Cuff Size: adult)    Pulse 57    Temp 36.7 C (98.1 F) (Temporal)    Resp 16    Ht 185.4 cm (6' 0.99")    Wt 71.5 kg (157 lb 11.2 oz)    SpO2 98%    BMI 20.81 kg/m . KPS 90%. He has no palpable supraclavicular or cervical, lymphadenopathy. His heart is in regular rate and rhythm with no murmurs, rubs, or gallops. Lungs are clear to auscultation bilaterally.     Laboratory data:   Lab Results   Component Value Date    PSAR3 1.38 05/14/2018    PSAR3 1.79 02/07/2018    PSAR3 4.18 (H) 10/07/2017     Impression:   JULIE PAOLINI is now 7 months status post treatment for his favorable intermediate-risk prostate cancer.   He has mild residual urinary symptoms from radiation. We discussed that he could take Flomax, even every other day, if  the symptoms are bothersome to him.     Plan:   Mr. Sheahan will see Dr. Lamar Blinks in March 2020, with labs prior.   We will follow with [name] in June 2020, with a PSA obtained before that time.    Dorothey Baseman MD

## 2018-07-02 ENCOUNTER — Encounter: Payer: Self-pay | Admitting: Primary Care

## 2018-07-02 DIAGNOSIS — K4090 Unilateral inguinal hernia, without obstruction or gangrene, not specified as recurrent: Secondary | ICD-10-CM

## 2018-07-09 ENCOUNTER — Encounter: Payer: Self-pay | Admitting: Surgery

## 2018-07-09 ENCOUNTER — Ambulatory Visit: Payer: BLUE CROSS/BLUE SHIELD | Attending: Surgery | Admitting: Surgery

## 2018-07-09 VITALS — BP 116/61 | HR 56 | Temp 96.1°F | Resp 18 | Ht 73.0 in | Wt 165.0 lb

## 2018-07-09 DIAGNOSIS — K4020 Bilateral inguinal hernia, without obstruction or gangrene, not specified as recurrent: Secondary | ICD-10-CM

## 2018-07-21 ENCOUNTER — Other Ambulatory Visit
Admission: RE | Admit: 2018-07-21 | Discharge: 2018-07-21 | Disposition: A | Discharge status: Home / Self Care | Payer: BLUE CROSS/BLUE SHIELD | Source: Ambulatory Visit | Attending: Urology | Admitting: Urology

## 2018-07-21 DIAGNOSIS — C61 Malignant neoplasm of prostate: Secondary | ICD-10-CM | POA: Insufficient documentation

## 2018-07-21 LAB — BASIC METABOLIC PANEL
Anion Gap: 11 (ref 7–16)
CO2: 26 mmol/L (ref 20–28)
Calcium: 9.3 mg/dL (ref 8.6–10.2)
Chloride: 104 mmol/L (ref 96–108)
Creatinine: 0.96 mg/dL (ref 0.67–1.17)
GFR,Black: 101 *
GFR,Caucasian: 87 *
Glucose: 89 mg/dL (ref 60–99)
Lab: 19 mg/dL (ref 6–20)
Potassium: 4 mmol/L (ref 3.3–5.1)
Sodium: 141 mmol/L (ref 133–145)

## 2018-07-21 LAB — TESTOSTERONE: Testosterone: 553 ng/dL (ref 193–740)

## 2018-07-21 LAB — PSA (EFF.4-2010): PSA (eff. 4-2010): 1.03 ng/mL (ref 0.00–4.00)

## 2018-07-25 NOTE — Progress Notes (Signed)
+  bilateral inguinal hernias    For robotic repair. Questions answered

## 2018-08-14 ENCOUNTER — Encounter: Payer: Self-pay | Admitting: Primary Care

## 2018-08-14 DIAGNOSIS — Z23 Encounter for immunization: Secondary | ICD-10-CM

## 2018-08-21 ENCOUNTER — Telehealth: Payer: Self-pay | Admitting: Urology

## 2018-08-22 NOTE — Progress Notes (Signed)
Due to [facility]-23 and hospital policy, this visit was conducted over the phone. Patient consent obtained.     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  He reports no voiding difficulties and is feeling well.  Results for Timothy Timothy Cross, Timothy Cross (MRN [id number]) as of 08/22/2018 17:59   Ref. Range 10/07/2017 15:01 02/07/2018 09:56 05/14/2018 09:46 07/21/2018 11:25   PSA (eff. 09-2008) Latest Ref Range: 0.00 - 4.00 ng/mL 4.18 (H) 1.79 1.38 1.03     Medications:   Current Outpatient Medications   Medication    ibuprofen (ADVIL,MOTRIN) 800 MG tablet    acetaminophen (TYLENOL) 325 MG tablet     No current facility-administered medications for this visit.        Allergies: No Known Allergies (drug, envir, food or latex)    Past medical history:   Past Medical History:   Diagnosis Date    Anxiety     Arthritis     hip    Cancer     Colon polyp        Past surgical history:   Past Surgical History:   Procedure Laterality Date    HERNIA REPAIR      INGUINAL HERNIA REPAIR      KNEE SURGERY Bilateral     arthroscopy ACL reconstruction    PR LAP, VENTRAL HERNIA REPAIR,REDUCIBLE N/A 10/29/2016    Procedure:  UMBILICAL HERNIA REPAIR;  Surgeon: Suszanne Finch, MD;  Location: [facility] MAIN [facility];  Service: General    PR [place] Bilateral 10/29/2016    Procedure:  LAPAROSCOPIC BILATERAL  INGUINAL HERNIA REPAIR with mesh;  Surgeon: Suszanne Finch, MD;  Location: [facility] MAIN OR;  Service: General    UMBILICAL Okay      Surgery Of Male Genitalia Vasectomy Conversion Data        Family history:   Family History   Problem Relation Age of Onset    Conversion Other         [id number]^Large Intestine Cancer^V16.0^Active^    Cancer Mother     Cancer Brother         Social History:   Social History     Socioeconomic History    Marital status: Widowed     Spouse name: Not on file    Number of children: Not on file    Years of education: Not on file    Highest education level: Not on file   Occupational History    Not on file   Tobacco Use    Smoking status: Never Smoker    Smokeless tobacco: Never Used   Substance and Sexual Activity    Alcohol use: Yes     Types: 1 Glasses of wine, 4 Cans of beer per week     Comment: 3-4 drinks a week     Drug use: No    Sexual activity: Yes     Partners: Female     Birth control/protection: None     Comment: vasectomy   Social History Narrative    Not on file   REVIEW of SYSTEMS    Constitutional: Negative.  Eyes: Negative  Allergic/Immunologic: located in allergy section  Neurological: Negative  Endocrine:Negative  Gastrointestinal: Negative  Cardiovascular: Negative  Integumentary: Negative  Musculoskeletal: Negative  HEENT (Head/Ear/Nose/Throat/Mouth): Negative  GU: See above.  Respiratory: Negative  Hematological: Negative  Behavorial: Negative  Psychological: Denies depression, or suicidal thoughts        Assessment:THis patient who has a history of prostate cancer who underwent brachytherapy one year ago. His PSA continues to descend.     Plan:Ther patient will follow up in six months getting labs prior to that visit.    The plan was discussed with the patient and the patient/patient rep demonstrated understanding to the provider's satisfaction.    Consent was previously obtained from the patient to complete this telephone consult; including the potential for financial liability.    5-10 minutes was spent reviewing the EMR and management of this patient.               Scheryl Marten, NP 08/22/2018 5:57 PM

## 2018-08-25 ENCOUNTER — Telehealth: Payer: Self-pay | Admitting: Urology

## 2018-08-25 ENCOUNTER — Ambulatory Visit: Payer: BLUE CROSS/BLUE SHIELD | Attending: Urology | Admitting: Urology

## 2018-08-25 DIAGNOSIS — C61 Malignant neoplasm of prostate: Secondary | ICD-10-CM

## 2018-08-25 NOTE — Telephone Encounter (Signed)
Telephone [employer] with the pt and he is almost one year out from brachytherapy for Gleason 7 prostate cancer. His PSA continues to decline and is now 1.03 for 1.38. Testosterone 553. He reports no voiding difficulties and is feeling well.  He has a follow up appointment in June with Dr. Virgilio Frees and we will see him in 6 months getting labs prior to that visit.

## 2018-08-25 NOTE — Telephone Encounter (Signed)
Spoke to patient , he has been scheduled.

## 2018-10-08 ENCOUNTER — Other Ambulatory Visit
Admission: RE | Admit: 2018-10-08 | Discharge: 2018-10-08 | Disposition: A | Discharge status: Home / Self Care | Payer: BLUE CROSS/BLUE SHIELD | Source: Ambulatory Visit | Attending: Primary Care | Admitting: Primary Care

## 2018-10-08 DIAGNOSIS — Z23 Encounter for immunization: Secondary | ICD-10-CM | POA: Insufficient documentation

## 2018-10-09 LAB — VARICELLA ZOSTER IGG AB: VZV IgG: POSITIVE

## 2018-11-07 ENCOUNTER — Encounter: Payer: Self-pay | Admitting: Primary Care

## 2018-11-10 ENCOUNTER — Encounter: Payer: Self-pay | Admitting: Primary Care

## 2018-11-25 ENCOUNTER — Other Ambulatory Visit
Admission: RE | Admit: 2018-11-25 | Discharge: 2018-11-25 | Disposition: A | Discharge status: Home / Self Care | Payer: BLUE CROSS/BLUE SHIELD | Source: Ambulatory Visit | Attending: Radiation Oncology | Admitting: Radiation Oncology

## 2018-11-25 DIAGNOSIS — C61 Malignant neoplasm of prostate: Secondary | ICD-10-CM | POA: Insufficient documentation

## 2018-11-25 LAB — PSA (EFF.4-2010): PSA (eff. 4-2010): 0.68 ng/mL (ref 0.00–4.00)

## 2018-11-27 ENCOUNTER — Encounter: Payer: Self-pay | Admitting: Radiation Oncology

## 2018-12-04 ENCOUNTER — Telehealth: Payer: BLUE CROSS/BLUE SHIELD | Attending: Radiation Oncology | Admitting: Radiation Oncology

## 2018-12-04 DIAGNOSIS — C61 Malignant neoplasm of prostate: Secondary | ICD-10-CM

## 2018-12-04 NOTE — Progress Notes (Addendum)
Telephone Visit     This is an established patient visit.    Reason for visit:     HPI:  58 yr old male with  Prostate Cancer, Favorable Intermediate Risk, T1c, Gleason 3+4 in 1 core, 3+3 in 2 cores, PSA 4.18 status post PSI on 10/17/17.    He is doing well with no significant change in appetite or energy level.  He is not typically getting up at night to urinate.  He denies pain or blood with urination.  He denies any significant urinary urgency, hesitancy or leakage.  He is unsure of erectile function, he is "alone since my wife died".  His bowel movements are normal without blood.  He has issues with arthritis in his right hip but no new or different bone pain.    Patient's problem list, allergies, and medications were reviewed and updated as appropriate. Please see the EHR for full details.    Review of Systems   Constitutional: Negative.    Gastrointestinal: Negative for blood in stool, constipation and diarrhea.   Genitourinary:        0x/night, no urgency, hesitancy or leakage.  Unsure of erectile function.   Musculoskeletal: Positive for joint pain.        Right hip pain not new.     Expanded Prostate Cancer Index Composite for Clinical Practice (EPIC-CP)  Prostate Cancer Quality of Life (QOL)  Over the past 4 weeks, how much of a problem has your urinary function been for you?: No problem  Which of the following best describes your urinary control during the last 4 weeks?: Total control  How many pads or adult diapers per day have you been using for urinary leakage during the past 4 weeks?: None  During the past 4 weeks, how big a problem, if any, has urinary dripping or leakage been for you?: No problem  During the past 4 weeks, how big a problem have you had with pain or burning on urination?: No problem  During the past 4 weeks, how big a problem have you had with weak urine stream or incomplete emptying?: Very small problem  During the past 4 weeks, how big a problem have you had with a need to urinate  frequently during the day?: No problem  How big a problem, if any, have you had with rectal pain or urgency to have a bowel movement?: No problem  How big a problem, if any, have you had with an increased frequency of bowel movements?: No problem  Overall, how big a problem have your bowel habits been for you during the last 4 weeks?: No problem  How big a problem, if any, has hot flashes or breast tenderness/enlargement been in the past 4 weeks?: No problem  How big a problem during the past 4 weeks have you had with feeling depressed?: Small problem  How big a problem during the past 4 weeks have you had with a lack of energy?: No problem    Pain    12/04/18 0905   PainSc:   0 - No pain     KPS: 90    Physical Exam:    This visit was performed during a pandemic event, thus the physical exam was not performed.      Results for Timothy Cross, Timothy Cross (MRN [id number]) as of 12/04/2018 08:17   Ref. Range 11/01/2014 14:46 03/15/2017 11:04 04/12/2017 13:41 10/07/2017 15:01 02/07/2018 09:56 05/14/2018 09:46 07/21/2018 11:25 11/25/2018 09:57   PSA (eff. 09-2008) Latest Ref  Range: 0.00 - 4.00 ng/mL 2.29 3.61 3.73 4.18 (H) 1.79 1.38 1.03 0.68       Assessment Plan:  PSA continues to go down.  No clinical evidence for disease recurrence.    PSA prior to visits.  Follow-up with Dr. Lamar Blinks in urology September of this year.  Follow-up here in December of this year.    The plan was discussed with the patient and the patient/patient rep demonstrated understanding to the provider's satisfaction.    Consent was previously obtained from the patient to complete this telephone consult; including the potential for financial liability.    11-20 minutes were spent on the phone with the patient, patient representatives, and/or other attendees.       Timothy Boccio, PA

## 2018-12-22 ENCOUNTER — Encounter: Payer: Self-pay | Admitting: Primary Care

## 2018-12-22 DIAGNOSIS — M1611 Unilateral primary osteoarthritis, right hip: Secondary | ICD-10-CM

## 2018-12-31 ENCOUNTER — Ambulatory Visit: Payer: BLUE CROSS/BLUE SHIELD

## 2018-12-31 DIAGNOSIS — Z0000 Encounter for general adult medical examination without abnormal findings: Secondary | ICD-10-CM

## 2018-12-31 NOTE — Progress Notes (Signed)
Patient was in the office for a shingrix injection.  Patient tolerated this well to the left arm.

## 2019-01-21 ENCOUNTER — Other Ambulatory Visit
Admission: RE | Admit: 2019-01-21 | Discharge: 2019-01-21 | Disposition: A | Discharge status: Home / Self Care | Payer: BLUE CROSS/BLUE SHIELD | Source: Ambulatory Visit | Attending: Urology | Admitting: Urology

## 2019-01-21 DIAGNOSIS — C61 Malignant neoplasm of prostate: Secondary | ICD-10-CM | POA: Insufficient documentation

## 2019-01-21 LAB — BASIC METABOLIC PANEL
Anion Gap: 12 (ref 7–16)
CO2: 25 mmol/L (ref 20–28)
Calcium: 9.1 mg/dL (ref 8.6–10.2)
Chloride: 105 mmol/L (ref 96–108)
Creatinine: 1.02 mg/dL (ref 0.67–1.17)
GFR,Black: 93 *
GFR,Caucasian: 81 *
Glucose: 99 mg/dL (ref 60–99)
Lab: 26 mg/dL — ABNORMAL HIGH (ref 6–20)
Potassium: 3.9 mmol/L (ref 3.3–5.1)
Sodium: 142 mmol/L (ref 133–145)

## 2019-01-21 LAB — PSA (EFF.4-2010): PSA (eff. 4-2010): 0.57 ng/mL (ref 0.00–4.00)

## 2019-01-21 LAB — TESTOSTERONE: Testosterone: 455 ng/dL (ref 193–740)

## 2019-01-21 NOTE — Telephone (Signed)
Pre-Operative Instructions                     FOLLOW YOUR SURGEON'S INSTRUCTIONS IF DIFFERENT THAN BELOW.                    PRIOR TO SURGERY     STARTING 8/15    Five days before surgery, if surgeon does not specify, please STOP taking:  Anti-inflammatory meds: (Ibuprofen, Motrin, Advil, Mobic, Meloxicam, Aleve, Naproxen, Voltaren, etc.)    Vitamins and herbal supplements, including herbal teas     YOU MAY TAKE ACETAMINOPHEN (TYLENOL) as needed    Directions regarding your prescribed blood thinners, including aspirin, must be approved by your cardiologist or prescribing doctor.      ARRIVAL/SURGICAL TIME    Staff from the [facility] will call you between 130PM and 4PM on the day before surgery to inform you of your arrival and surgery times.    Please arrange for transportation to and from the hospital. You must have a responsible adult to stay with you after your surgery if you are going home the same day.    Please know that you should not drive for 24 hours after receiving anesthesia.      DAY BEFORE SURGERY  Keep yourself well hydrated to aid in the placement of your IV and for your general well-being.  Do not eat anything after midnight the night before your surgery (including candy or gum).                                                                                           DAY OF SURGERY  DO NOT CONSUME FOOD OF ANY KIND.    Only Gatorade, clear apple juice or water from midnight until 2 hours before your scheduled surgery.    Please bring photo identification and your insurance card with you for registration.    Please wear loose, comfortable clothing and comfortable walking shoes.  Wear something that will easily accommodate a bandage, cast or other type of dressing at your surgical site.    DO NOT WEAR: BODY LOTION OR SCENTS.      Please understand that rings and body piercings need to be removed and left at home.  If they are not removed, your surgery  is at risk of being delayed or cancelled.    When you come to [place], please try not bring any valuable items with the exception of your phone to keep you in contact with your family members.  Valuables such as jewelry, cash and credit cards are best left at home during your stay.  Please send them home with family members.  If you are alone a staff member will assist you in storing your belongings.    [facility] does not assume responsibility for items brought with you on the day of surgery.    If wearing eyeglasses, please bring a case.  DO NOT WEAR CONTACT LENSES.    You may shower, brush your teeth, and use deodorant.    Patient visitation is restricted at this time due to COVID-19 concerns.  Same day surgery patients may have one support person present during the preoperative and discharge phases of care.    Patients being admitted to the hospital may have one support person present during the preoperative phase of care but will follow the instructions for inpatient visitation after surgery.      MEDICATIONS: DAY OF SURGERY    Take your medications as directed according to your printed, verbal or MyChart instructions.  Anxiety and pain medications may be taken as prescribed at any time prior to arrival.   Medications from the [facility] will be administered to you under the direction of your surgeon.   Please leave your prescriptions at home with the exception of inhalers.      AT [facility] in the Main Ramp garage.  Enter the building through the [employer].     Please stop at the Information Desk for directions to the [facility] on [address].      Man    The pharmacy is open from 9am to 5:30pm on weekdays and 10am-2pm on Saturdays.     Any prescriptions you may need after your surgery can be filled at the Outpatient Pharmacy in the main lobby:    Pharmacy staff will coordinate obtaining your  insurance information and co-payments, which will be identical to those of your home pharmacy.  A credit card can be called in to the pharmacy in advance and stored securely with  encryption, to be used only for medications prescribed for you.  Please have your support person call the pharmacy with a credit card number during your surgery to expedite your discharge from the hospital.  Cash or a check are acceptable forms of payment as well, and payment can be facilitated at the time of discharge by staff members accompanying you to your car.  Prescriptions cannot be filled without payment. Thank you for your consideration.    QUESTIONS?    Question about these instructions? Call [phone], select option 2, and leave a message at any time.   A nurse will return your call during our regular business hours.  Any questions regarding specifics about your surgery or recovery? Please call your surgeons office.

## 2019-01-22 ENCOUNTER — Other Ambulatory Visit: Payer: Self-pay | Admitting: Surgery

## 2019-01-22 DIAGNOSIS — Z01818 Encounter for other preprocedural examination: Secondary | ICD-10-CM

## 2019-01-26 ENCOUNTER — Ambulatory Visit: Payer: BLUE CROSS/BLUE SHIELD | Attending: Surgery

## 2019-01-26 DIAGNOSIS — Z01818 Encounter for other preprocedural examination: Secondary | ICD-10-CM

## 2019-01-26 DIAGNOSIS — Z20828 Contact with and (suspected) exposure to other viral communicable diseases: Secondary | ICD-10-CM | POA: Insufficient documentation

## 2019-01-26 DIAGNOSIS — Z01812 Encounter for preprocedural laboratory examination: Secondary | ICD-10-CM | POA: Insufficient documentation

## 2019-01-26 LAB — COVID-19 PCR

## 2019-01-26 LAB — COVID-19 NAAT (PCR): COVID-19 NAAT (PCR): NEGATIVE

## 2019-01-29 ENCOUNTER — Encounter
Admission: RE | Disposition: A | Discharge status: Home / Self Care | Payer: Self-pay | Source: Ambulatory Visit | Attending: Surgery

## 2019-01-29 ENCOUNTER — Ambulatory Visit: Payer: BLUE CROSS/BLUE SHIELD | Admitting: Anesthesiology

## 2019-01-29 ENCOUNTER — Ambulatory Visit
Admission: RE | Admit: 2019-01-29 | Discharge: 2019-01-29 | Disposition: A | Discharge status: Home / Self Care | Payer: BLUE CROSS/BLUE SHIELD | Source: Ambulatory Visit | Attending: Surgery | Admitting: Surgery

## 2019-01-29 ENCOUNTER — Ambulatory Visit: Payer: BLUE CROSS/BLUE SHIELD

## 2019-01-29 ENCOUNTER — Other Ambulatory Visit: Payer: Self-pay

## 2019-01-29 DIAGNOSIS — K4091 Unilateral inguinal hernia, without obstruction or gangrene, recurrent: Secondary | ICD-10-CM

## 2019-01-29 HISTORY — PX: PR LAPAROSCOPY SURG RPR INITIAL INGUINAL HERNIA: 49650

## 2019-01-29 SURGERY — REPAIR, HERNIA, INGUINAL, ROBOT-ASSISTED
Anesthesia: General | Site: Groin | Laterality: Left | Wound class: Clean

## 2019-01-29 MED ORDER — ONDANSETRON HCL 2 MG/ML IV SOLN *I*
INTRAMUSCULAR | Status: DC | PRN
Start: 2019-01-29 — End: 2019-01-29
  Administered 2019-01-29: 4 mg via INTRAMUSCULAR

## 2019-01-29 MED ORDER — DEXAMETHASONE SODIUM PHOSPHATE 4 MG/ML INJ SOLN *WRAPPED*
INTRAMUSCULAR | Status: DC | PRN
Start: 2019-01-29 — End: 2019-01-29
  Administered 2019-01-29: 4 mg via INTRAVENOUS

## 2019-01-29 MED ORDER — DEXTROSE 5 % FLUSH FOR PUMPS *I*
0.0000 mL/h | INTRAVENOUS | Status: DC | PRN
Start: 2019-01-29 — End: 2019-01-29

## 2019-01-29 MED ORDER — MIDAZOLAM HCL 1 MG/ML IJ SOLN *I* WRAPPED
INTRAMUSCULAR | Status: AC
Start: 2019-01-29 — End: 2019-01-29
  Filled 2019-01-29: qty 2

## 2019-01-29 MED ORDER — LIDOCAINE HCL (PF) 1 % IJ SOLN *I*
0.1000 mL | INTRAMUSCULAR | Status: DC | PRN
Start: 2019-01-29 — End: 2019-01-29
  Administered 2019-01-29: 0.1 mL via SUBCUTANEOUS
  Filled 2019-01-29: qty 2

## 2019-01-29 MED ORDER — LACTATED RINGERS IV SOLN *I*
20.0000 mL/h | INTRAVENOUS | Status: DC
Start: 2019-01-29 — End: 2019-01-29
  Administered 2019-01-29 (×2): 20 mL/h via INTRAVENOUS

## 2019-01-29 MED ORDER — BUPIVACAINE-EPINEPHRINE 0.25 % IJ SOLUTION *WRAPPED*
INTRAMUSCULAR | Status: DC | PRN
Start: 2019-01-29 — End: 2019-01-29
  Administered 2019-01-29: 10 mL via SUBCUTANEOUS

## 2019-01-29 MED ORDER — MIDAZOLAM HCL 1 MG/ML IJ SOLN *I* WRAPPED
INTRAMUSCULAR | Status: DC | PRN
Start: 2019-01-29 — End: 2019-01-29
  Administered 2019-01-29: 2 mg via INTRAVENOUS

## 2019-01-29 MED ORDER — SODIUM CHLORIDE 0.9 % IV SOLN WRAPPED *I*
20.0000 mL/h | Status: DC
Start: 2019-01-29 — End: 2019-01-29

## 2019-01-29 MED ORDER — NEOSTIGMINE METHYLSULFATE 1 MG/ML IJ SOLN WRAPPED *I*
Status: DC | PRN
Start: 2019-01-29 — End: 2019-01-29
  Administered 2019-01-29: 3 mg via INTRAVENOUS

## 2019-01-29 MED ORDER — PROMETHAZINE HCL 25 MG/ML IJ SOLN *I*
INTRAMUSCULAR | Status: DC | PRN
Start: 2019-01-29 — End: 2019-01-29
  Administered 2019-01-29: 12.5 mg via INTRAVENOUS

## 2019-01-29 MED ORDER — FENTANYL CITRATE 50 MCG/ML IJ SOLN *WRAPPED*
INTRAMUSCULAR | Status: DC | PRN
Start: 2019-01-29 — End: 2019-01-29
  Administered 2019-01-29: 100 ug via INTRAVENOUS

## 2019-01-29 MED ORDER — OXYCODONE HCL 5 MG PO TABS *I*
5.0000 mg | ORAL_TABLET | Freq: Four times a day (QID) | ORAL | 0 refills | Status: DC | PRN
Start: 2019-01-29 — End: 2019-04-10
  Filled 2019-01-29: qty 10, 3d supply, fill #0

## 2019-01-29 MED ORDER — ONDANSETRON HCL 2 MG/ML IV SOLN *I*
4.0000 mg | Freq: Once | INTRAMUSCULAR | Status: DC | PRN
Start: 2019-01-29 — End: 2019-01-29

## 2019-01-29 MED ORDER — DEXAMETHASONE SODIUM PHOSPHATE 4 MG/ML INJ SOLN *WRAPPED*
INTRAMUSCULAR | Status: AC
Start: 2019-01-29 — End: 2019-01-29
  Filled 2019-01-29: qty 1

## 2019-01-29 MED ORDER — HYDROMORPHONE HCL PF 0.5 MG/0.5 ML IJ SOLN *I*
0.5000 mg | INTRAMUSCULAR | Status: DC | PRN
Start: 2019-01-29 — End: 2019-01-29
  Administered 2019-01-29: 0.5 mg via INTRAVENOUS
  Filled 2019-01-29: qty 0.5

## 2019-01-29 MED ORDER — NEOSTIGMINE METHYLSULFATE 1 MG/ML IJ SOLN WRAPPED *I*
Status: AC
Start: 2019-01-29 — End: 2019-01-29
  Filled 2019-01-29: qty 3

## 2019-01-29 MED ORDER — LIDOCAINE HCL 2 % (PF) IJ SOLN *I*
INTRAMUSCULAR | Status: AC
Start: 2019-01-29 — End: 2019-01-29
  Filled 2019-01-29: qty 5

## 2019-01-29 MED ORDER — LACTATED RINGERS IV SOLN *I*
125.0000 mL/h | INTRAVENOUS | Status: DC
Start: 2019-01-29 — End: 2019-01-29
  Administered 2019-01-29 (×2): 125 mL/h via INTRAVENOUS

## 2019-01-29 MED ORDER — CEFAZOLIN 2000 MG IN STERILE WATER 20ML SYRINGE *I*
2000.0000 mg | PREFILLED_SYRINGE | Freq: Once | INTRAVENOUS | Status: AC
Start: 2019-01-29 — End: 2019-01-29
  Administered 2019-01-29: 2000 mg via INTRAVENOUS
  Filled 2019-01-29: qty 20

## 2019-01-29 MED ORDER — PROPOFOL 10 MG/ML IV EMUL (INTERMITTENT DOSING) WRAPPED *I*
INTRAVENOUS | Status: DC | PRN
Start: 2019-01-29 — End: 2019-01-29
  Administered 2019-01-29: 130 mg via INTRAVENOUS

## 2019-01-29 MED ORDER — ROCURONIUM BROMIDE 10 MG/ML IV SOLN *WRAPPED*
Status: DC | PRN
Start: 2019-01-29 — End: 2019-01-29
  Administered 2019-01-29: 50 mg via INTRAVENOUS

## 2019-01-29 MED ORDER — ONDANSETRON HCL 2 MG/ML IV SOLN *I*
INTRAMUSCULAR | Status: AC
Start: 2019-01-29 — End: 2019-01-29
  Filled 2019-01-29: qty 2

## 2019-01-29 MED ORDER — ROCURONIUM BROMIDE 10 MG/ML IV SOLN *WRAPPED*
Status: AC
Start: 2019-01-29 — End: 2019-01-29
  Filled 2019-01-29: qty 5

## 2019-01-29 MED ORDER — LIDOCAINE HCL 2 % IJ SOLN *I*
INTRAMUSCULAR | Status: DC | PRN
Start: 2019-01-29 — End: 2019-01-29
  Administered 2019-01-29: 60 mg via INTRAVENOUS

## 2019-01-29 MED ORDER — PROMETHAZINE HCL 25 MG/ML IJ SOLN *I*
INTRAMUSCULAR | Status: AC
Start: 2019-01-29 — End: 2019-01-29
  Filled 2019-01-29: qty 1

## 2019-01-29 MED ORDER — GLYCOPYRROLATE 0.2 MG/ML IJ SOLN *WRAPPED*
INTRAMUSCULAR | Status: AC
Start: 2019-01-29 — End: 2019-01-29
  Filled 2019-01-29: qty 2

## 2019-01-29 MED ORDER — SODIUM CHLORIDE 0.9 % FLUSH FOR PUMPS *I*
0.0000 mL/h | INTRAVENOUS | Status: DC | PRN
Start: 2019-01-29 — End: 2019-01-29

## 2019-01-29 MED ORDER — HYDROMORPHONE HCL PF 1 MG/ML IJ SOLN *WRAPPED*
INTRAMUSCULAR | Status: AC
Start: 2019-01-29 — End: 2019-01-29
  Filled 2019-01-29: qty 1

## 2019-01-29 MED ORDER — HYDROMORPHONE HCL PF 1 MG/ML IJ SOLN *WRAPPED*
INTRAMUSCULAR | Status: DC | PRN
Start: 2019-01-29 — End: 2019-01-29
  Administered 2019-01-29: 1 mg via INTRAVENOUS

## 2019-01-29 MED ORDER — SODIUM CHLORIDE 0.9 % 100 ML IV SOLN *I*
6.2500 mg | Freq: Once | INTRAVENOUS | Status: DC | PRN
Start: 2019-01-29 — End: 2019-01-29

## 2019-01-29 MED ORDER — GLYCOPYRROLATE 0.2 MG/ML IJ SOLN *WRAPPED*
INTRAMUSCULAR | Status: DC | PRN
Start: 2019-01-29 — End: 2019-01-29
  Administered 2019-01-29: 0.4 mg via INTRAVENOUS

## 2019-01-29 MED ORDER — FENTANYL CITRATE 50 MCG/ML IJ SOLN *WRAPPED*
INTRAMUSCULAR | Status: AC
Start: 2019-01-29 — End: 2019-01-29
  Filled 2019-01-29: qty 2

## 2019-01-29 MED ORDER — HEPARIN SODIUM 5000 UNIT/ML SQ *I*
5000.0000 [IU] | Freq: Once | SUBCUTANEOUS | Status: AC
Start: 2019-01-29 — End: 2019-01-29
  Administered 2019-01-29: 5000 [IU] via SUBCUTANEOUS
  Filled 2019-01-29: qty 1

## 2019-01-29 MED ORDER — PROPOFOL 10 MG/ML IV EMUL (INTERMITTENT DOSING) WRAPPED *I*
INTRAVENOUS | Status: AC
Start: 2019-01-29 — End: 2019-01-29
  Filled 2019-01-29: qty 20

## 2019-01-29 SURGICAL SUPPLY — 46 items
APPLICATOR TIP FIBRIN SEALANT 45CM FLEXIBLE EVICEL (Supply) IMPLANT
BLADE CLIPPER SURG (Supply) ×1
BLADE SUR CLIPPER W37.2MM CUT H0.23MM GEN PURP EXISTING CLP HNDL DISP (Supply) ×1 IMPLANT
BLADE SUR W37.2MM CUT H0.23MM GEN PURP EXISTING CLP HNDL DISP (Supply) ×1 IMPLANT
BLADE SURG CARBON STEEL #10 STER (Supply) IMPLANT
BLANKET UPPER BODY TEMP THERAPY (Drape) ×2 IMPLANT
CANNULA XI SEAL 5-8MM (Other) ×6 IMPLANT
COVER LIGHT HANDLE SURG FLEX (Other) ×4 IMPLANT
COVER TABLE 60X90 REG DUTY (Supply) ×2 IMPLANT
COVER TIP ACCESSORY FOR DAVINC (Other) ×2 IMPLANT
DEVICE CLN FOR SCP AND TRCR CLEANOR (Other) ×1 IMPLANT
DRAPE CLMN W14XH6.5XL5IN FOR DA VINCI XI ROBOTIC SYS (Drape) ×1 IMPLANT
DRAPE XI COLUMN (Drape) ×1
DRAPE XI INSTRUMENT ARM (Drape) ×6 IMPLANT
DRIVER NEEDLE MEGA SUTR CUT DAVINCI XI DISCONTINUED USE 262946 (Needle) ×2 IMPLANT
FORCEPS 8MM PROGRASP DAVINCI XI DISCONTINUED USE 262948 (Other) ×2 IMPLANT
GLOVE BIOGEL PI MICRO IND UNDER SZ 6.5 LF (Glove) ×4 IMPLANT
GLOVE BIOGEL PI MICRO IND UNDER SZ 7.0 LF (Glove) ×4 IMPLANT
GLOVE BIOGEL PI MICRO IND UNDER SZ 7.5 LF (Glove) ×4 IMPLANT
GLOVE BIOGEL PI ORTHOPRO SZ 7.5 LF (Glove) ×4 IMPLANT
GLOVE BIOGEL PI ULTRATOUCH M SZ 6.5 LF (Glove) ×4 IMPLANT
GLOVE BIOGEL PI ULTRATOUCH M SZ 7 LF (Glove) ×4 IMPLANT
GLOVE BIOGEL PI ULTRATOUCH M SZ 7.5 LF (Glove) ×4 IMPLANT
KIT EVICEL SEALANT 5ML (Supply) IMPLANT
KIT SCOPE CLEANOR (Other) ×1
MESH HERN M W8.5XL13.7CM L INGUINAL WHT POLYPR MFIL NONABSORBABLE LAP 3D MAX (Implant) ×2 IMPLANT
NEEDLE PNEUMOPERITONEUM 150MM (Needle) ×2 IMPLANT
OBTURATOR OPTICAL BLADELESS 8MM (Supply) ×2 IMPLANT
PACK CUSTOM GENERAL DAVINCI PROCEDURE (Pack) ×2 IMPLANT
PENCIL ROCKER SWITCH W/HOLSTER SS DISP (Supply) ×2 IMPLANT
SCISSORS CVD MONOPOLAR DAVINCI XI (Other) ×2 IMPLANT
SOL H2O IRRIG STER 1000ML BTL (Solution) ×1
SOL SOD CHL IRRIG 1000ML BTL (Solution) ×1
SOL SOD CHL IV .9PCT 1000ML BAG (Drug) IMPLANT
SOL SODIUM CHLORIDE IRRIG 1000ML BTL (Solution) ×1 IMPLANT
SOL WATER IRRIG STERILE 1000ML BTL (Solution) ×1 IMPLANT
SPIKE MINI DISPENSING PIN (BBRAUN DP1000) (Supply) ×2 IMPLANT
STAPLER SKIN 35W NL (Supply)
STAPLER SKIN WIDE STPL LEG L3.9MM WIRE DIA0.58MM FIX HD PROX (Supply) IMPLANT
SUTR MONOCRYL PLUS 4-0 18PS2 (Suture) ×4 IMPLANT
SUTR VLOC NEEDLE 3-0 V-20 6IN (Suture) ×5 IMPLANT
SYRINGE LUERLOCK 30ML INDIVIDUAL WRAP (Syringe) ×1 IMPLANT
SYRINGE ONLY 30ML LUER-LOK (Syringe) ×1
TRAY FOLEY SIL DRAIN BAG SECURE 14 FR (Other) ×2 IMPLANT
TROCAR XCEL ENDOPATH BLADELESS 5X100MM (Other) IMPLANT
TUBING INSUFFLATON F UHI (Tubing) ×2 IMPLANT

## 2019-01-29 NOTE — Discharge Instructions (Signed)
[facility] Discharge Instructions    DATE: 01/29/2019   Procedure: Robot assisted left inguinal hernia repair    Physician: Suszanne Finch, MD    You have received sedative medication and/or general anesthesia which may make you drowsy for as long as 24 hours.     A.) DO NOT drive or operate any machinery for 24 hours    B.) DO NOT drink alcoholic beverages for 24 hours    C.) DO NOT make major decisions, sign contracts, etc. for 24 hours      DIET:  Resume your previous diet   ACTIVITY:  Use ice pack. Walking is encouraged. Climb stairs as tolerated. No heavy lifiting (nothing heavier than 20 pounds) for 4 weeks or until cleared by surgeon.      CARE OF DRESSING OR INCISION:  Keep incision dry. Your incisions were covered with a liquid dressing. These will scab and peel off on their own.    BATHING/SHOWERING  May shower. No tub baths or swimming for at least 2 weeks. Pat incision dry after shower. Do not scrub incision.    FOLLOW-UP CARE  Call for an appointment with Dr. Quay Burow at [phone] if you do not have a follow up appointment scheduled in two weeks    PAIN:  You may have a narcotic medication prescribed to you for pain.  Major side effects include constipation.  It will benefit you to take Colace, Senna which are over the counter stool softeners. Please do not exceed 3000mg  of Acetaminophen over a 24 hour period      Call your doctor for the following problems: FEVER over 101 F/38.4 C; PAIN not relieved with pain medication ordered; SWELLING, REDNESS at incision site, heavy BLEEDING, foul DRAINAGE, INABILITY TO URINATE.     If you are unable to contact your doctor please call or go to [facility] Emergency Department [phone]  If you have any problems in regards to your anesthesia please call (days) [phone], have the operator page an anesthesiologist on call.

## 2019-01-29 NOTE — H&P (Signed)
General Surgery Consultation Note    Patient: Timothy Cross  LOS: 0 days  Attending: Quay Burow          HPI: Timothy Cross is a 58 y.o. male who presents for robot assisted bilateral inguinal hernia repair. He has no new complaints today. Denies cough, fever, chills, nausea, or emesis. No change in bowel habits or symptoms of obstruction.    Past Medical History:   Past Medical History:   Diagnosis Date    Anxiety     Arthritis     hip    Cancer     Colon polyp        Past Surgical History:   Past Surgical History:   Procedure Laterality Date    HERNIA REPAIR      INGUINAL HERNIA REPAIR      KNEE SURGERY Bilateral     arthroscopy ACL reconstruction    PR LAP, VENTRAL HERNIA REPAIR,REDUCIBLE N/A 10/29/2016    Procedure:  UMBILICAL HERNIA REPAIR;  Surgeon: Suszanne Finch, MD;  Location: [facility] MAIN [facility];  Service: General    PR LAP,INGUINAL HERNIA REPR,INITIAL Bilateral 10/29/2016    Procedure:  LAPAROSCOPIC BILATERAL  INGUINAL HERNIA REPAIR with mesh;  Surgeon: Suszanne Finch, MD;  Location: [facility]IN [facility];  Service: General    UMBILICAL [place]      Surgery Of Male Genitalia Vasectomy Conversion Data        Medications:  Current Facility-Administered Medications   Medication    sodium chloride 0.9 % FLUSH REQUIRED IF PATIENT HAS IV    dextrose 5 % FLUSH REQUIRED IF PATIENT HAS IV    Lactated Ringers Infusion    sodium chloride 0.9 % IV    lidocaine PF 1 % injection 0.1 mL    heparin (porcine) SQ injection 5,000 Units    ceFAZolin (ANCEF) syringe 2,000 mg       Allergies:   Allergies   Allergen Reactions    Environmental Allergies Other (See Comments)     sneezing       Family History:   family history includes Cancer in his brother and mother; Conversion in an other family member.    Social History:   Social History     Tobacco Use    Smoking status: Never Smoker    Smokeless tobacco: Never Used   Substance Use Topics    Alcohol use: Yes     Types: 1 Glasses of wine, 4 Cans  of beer per week     Comment: 3-4 drinks a week         Review of Systems: As above, otherwise 12 point review of systems negative    Physical Examination:  Vitals:     Height: 182.9 cm (6') Weight: 68 kg (150 lb)  General appearance: alert, appears older than stated age, cooperative and no distress  Head: normocephalic, atraumatic, face symmetric  Lungs: nonlabored respirations, clear to auscultation bilaterally  Heart: RRR  Abdomen: soft, nondistended  GU: bilateral reducible inguinal hernias  Extremities: atraumatic, wwp, no c/c/e  Neuro: alert, oriented, no focal deficits  Vascular: palpable pulses throughout    Lab Results:  Laboratory values:   No results for input(s): WBC, HGB, HCT, PLT, INR, ESR, CRP in the last 72 hours.    No components found with this basename: APTT, PT No results for input(s): NA, K, CL, CO2, UN, CREAT, GLU, CA, MG, PO4 in the last 72 hours. No results  for input(s): AST, ALT, ALK, TB, DB, TP, AMY, LIP, ALB, PALB in the last 72 hours.   Imaging:   No results found.     ASSESSMENT & RECOMMENDATIONS  Timothy Cross is a 58 y.o. male who presents for robot assisted bilateral inguinal hernia repair. He has no new complaints today.    - OR today    Author: Maudry Mayhew, MD as of: 01/29/2019  at: 10:16 AM

## 2019-01-29 NOTE — Anesthesia Preprocedure Evaluation (Signed)
Anesthesia Pre-operative History and Physical for Timothy Cross  .  .  .  Anesthesia Evaluation Information Source: records, patient     ANESTHESIA HISTORY  Pertinent(-):  No History of anesthetic complications or Family hx of anesthetic complications    GENERAL  Pertinent (-):  No history of anesthetic complications or Family Hx of Anesthetic Complications    HEENT  Pertinent (-):  No TMJD or neck pain PULMONARY  Pertinent(-):  No smoking, asthma, shortness of breath, recent URI or cough/congestion    CARDIOVASCULAR  Pertinent(-):  No hypertension, valvular heart disease, anticoagulants/antiplatelet medications or hx of DVT    GI/HEPATIC/RENAL    NPO Status  NPO    > 8hrs ago (solids) and > 2hrs ago (clears)  Pertinent(-):  No GERD, liver  issues or renal issues  NEURO/PSYCH  Pertinent(-):  No seizures or cerebrovascular event    ENDO/OTHER  Pertinent(-):  No diabetes mellitus, thyroid disease    HEMATOLOGIC  Pertinent(-):  No bruising/bleeding easily, coagulopathy or anticoagulants/antiplatelet medications         Physical Exam    Airway            Mouth opening: normal            Mallampati: II            Airway Impression: easy  Dental   Normal Exam   Cardiovascular           Rhythm: regular           Rate: normal  Vascular Access            > 1 PIV       Pulmonary     breath sounds clear to auscultation    No cough    Mental Status     oriented to person, place and time         ________________________________________________________________________  PLAN  ASA Score  2  Anesthetic Plan general     Induction (routine IV) General Anesthesia/Sedation Maintenance Plan (inhaled agents, IV bolus and neuromuscular blockade);  Airway Manipulation (direct laryngoscopy); Airway (cuffed ETT); Line ( use current access); Monitoring (standard ASA); Positioning (supine); PONV Plan (dexamethasone, ondansetron and promethazine); Pain (per surgical team); PostOp (PACU)    Informed Consent     Risks:         Risks discussed  were commensurate with the plan listed above with the following specific points: N/V, aspiration, sore throat, hypotension, headache, infection, unsteadiness, dizziness and fatigue, Damage to: eyes, nerves, teeth and blood vessels, allergic Rx and unexpected serious injury.    Anesthetic Consent:         Anesthetic plan (and risks as noted above) were discussed with patient    Plan also discussed with team members including:       attending    Attending Attestation:  As the primary attending anesthesiologist, I attest that the patient or proxy understands and accepts the risks and benefits of the anesthesia plan. I also attest that I have personally performed a pre-anesthetic examination and evaluation, and prescribed the anesthetic plan for this particular location within 48 hours prior to the anesthetic as documented. Leland Johns II, MD 11:20 AM

## 2019-01-29 NOTE — H&P (Signed)
UPDATES TO PATIENT'S CONDITION on the DAY OF SURGERY/PROCEDURE    I. Updates to Patient's Condition (to be completed by a provider privileged to complete a H&P, following reassessment of the patient by the provider):    Day of Surgery/Procedure Update:  History  History reviewed and no change    Physical  Physical exam updated and no change            II. Procedure Readiness   I have reviewed the patient's H&P and updated condition. By completing and signing this form, I attest that this patient is ready for surgery/procedure.    III. Attestation   I have reviewed the updated information regarding the patient's condition and it is appropriate to proceed with the planned surgery/procedure.    I have reminded Timothy Cross that COVID-19 is still present in our community. He was advised that [facility] and its affiliates have made deliberate and widespread changes to policies and procedures, consistent with applicable directives, in order to reduce the risk of exposure in our facilities.     I further explained and Timothy Cross understands that given the communicability of the SARS CoV2 coronavirus, there remains a small but real risk of contracting the disease while receiving perioperative care - even with stringent preventive measures in place.   Timothy Cross understands the potential consequences of COVID-19 disease as it relates to their planned procedure and anticipated postoperative course.      The patient and I have considered and discussed the relative risks and benefits of proceeding with his/her surgery - both in terms of the procedure itself, and also in the context of the ongoing pandemic.  Timothy Cross wishes to proceed with the procedure.     Tien Aispuro E Aydin Cavalieri, MD as of 8:11 AM 01/29/2019

## 2019-01-29 NOTE — Op Note (Signed)
Operative Note (Surgical Case/Log ID: [id number])       Date of Surgery: 01/29/2019       Surgeons: Surgeon(s) and Role:     * Burns, Virl Diamond, MD - Primary     * Cherylann Banas, MD - Resident - Assisting     * Mahdi, Albertine Grates, MD - Resident - Assisting       Pre-op Diagnosis: Pre-Op Diagnosis Codes:     * Bilateral inguinal hernia without obstruction or gangrene, recurrence not specified [K40.20]       Post-op Diagnosis: Post-Op Diagnosis Codes:     * Bilateral inguinal hernia without obstruction or gangrene, recurrence not specified [K40.20]       Procedure(s) Performed: Procedure(s) (LRB):  LAPAROSCOPIC REPAIR, HERNIA,BILATERAL INGUINAL, ROBOT-ASSISTED/ (Bilateral)       Anesthesia Type: General        Fluid Totals: I/O this shift:  08/20 0700 - 08/20 1459  In: 1000 (14.7 mL/kg) [I.V.:1000]  Out: - (0 mL/kg)   Net: 1000  Weight: 68 kg        Estimated Blood Loss: * No values recorded between 01/29/2019 11:44 AM and 01/29/2019  1:14 PM *       Specimens to Pathology:  * No specimens in log *       Temporary Implants:        Packing:                 Patient Condition: good       Indications: Timothy Cross is a 58 y.o. male with past medical history of bilateral laparoscopic inguinal hernia repair with mesh in 2018 with recurrent left direct inguinal hernia.       Findings (Including unexpected complications): Left recurrent direct inguinal hernia     Description of Procedure: The patient was brought to the operating room, placed supine upon the operating table.  He had the successful induction of general endotracheal anesthesia.  He had his abdomen prepped and draped in the usual sterile fashion.  A preoperative time-out was carried out in the standard fashion.  Following the preoperative time-out, approximately 10 cc of 0.25% Marcaine with epinephrine was infiltrated in the skin and subcutaneous tissues around the umbilicus.  A transverse incision was made in the skin, and a Veress needle was introduced through the  abdominal cavity.  The saline drop test confirmed the internal location of the Veress needle.  The abdomen was insufflated to 15 mmHg without difficulty.  The abdomen was observed.  There was a small direct left inguinal hernia. The right side previous mesh was intact and there was not evidence of a right inguinal hernia.  Additional robotic trocars were then placed in the left and right mid abdomen under direct visualization after infiltrating the skin with local anesthetic.  Through these, a large 3-D max left-sided mesh was placed as well as a 6-inch V-LOC suture.  The robot was docked, and the operation was carried out.  A properitoneal flap was raised just at the arcuate line and carried down to the inguinal area.  The entire properitoneal space and myopectineal orifice were exposed.  The spermatic cord was skeletonized.  A small direct inguinal hernia was reduced, and the mesh was placed in the properitoneal space.  The lower edge was below the level of the reflected peritoneum, and the entire myopectineal orifice was covered, and it laid in good position.  There was adequate hemostasis observed.  The properitoneal defect was then closed using the  V-LOC suture in a running fashion.  The trocars were removed after evacuating the abdomen of CO2, and the skin was closed using a 4-0 Monocryl in a subcuticular fashion.  Dermabond was placed in the wounds.  The patient tolerated the procedure very well.  He was aroused from anesthesia, extubated in the operating room, and transported in a stable condition to the PACU.  All sponge, needle, and instrument counts were reported correct at the conclusion of the case.      Signed:  Cherylann Banas, MD  on 01/29/2019 at 1:14 PM

## 2019-01-29 NOTE — Anesthesia Procedure Notes (Signed)
---------------------------------------------------------------------------------------------------------------------------------------    AIRWAY   GENERAL INFORMATION AND STAFF    Patient location during procedure: OR       Date of Procedure: 01/29/2019 11:56 AM  CONDITION PRIOR TO MANIPULATION     Current Airway/Neck Condition:  Normal        For more airway physical exam details, see Anesthesia PreOp Evaluation  AIRWAY METHOD     Patient Position:  Sniffing    Preoxygenated: yes      Induction: IV  Mask Difficulty Assessment:  0 - not attempted      Technique Used for Successful ETT Placement:  Direct laryngoscopy    Devices/Methods Used in Placement:  Cricoid pressure    Blade Type:  Macintosh    Laryngoscope Blade/Video laryngoscope Blade Size:  3    Cormack-Lehane Classification:  Grade IIa - partial view of glottis    Placement Verified by: capnometry, auscultation and equal breath sounds      Number of Attempts at Approach:  1    Number of Other Approaches Attempted:  0  FINAL AIRWAY DETAILS    Final Airway Type:  Endotracheal airway    Adjunct Airway: oropharyngeal airway (OPA)    OPA Size:  61mm    Final Endotracheal Airway:  ETT      Cuffed: cuffed    Insertion Site:  Oral    ETT Size (mm):  7.5    Distance inserted from Lips (cm):  22  ----------------------------------------------------------------------------------------------------------------------------------------

## 2019-01-30 ENCOUNTER — Telehealth: Payer: Self-pay

## 2019-01-30 NOTE — Telephone Encounter (Signed)
Pt states that he went into the surgery and he was told that he was going to have a bilateral hernia repair.  When he looked at his d/c instructions, it was noted that he only had a left inguinal hernia repair.  Pt states that now he is not sure if the right side was repaired, was something missed?  Pt reassured that nothing was missed.  Per the op note, the right side was intact and did not need repairing so only the left side was done.  Pt is aware that DB is on vacation but he would like to speak with him when he returns,  Note forwarded on to DB and pt appreciative.

## 2019-01-30 NOTE — Telephone Encounter (Signed)
-----   Message from Francene Finders sent at 01/30/2019 11:43 AM EDT -----  Regarding: FW: Questioning his surgery  Contact: [phone]  Dr. Quay Burow is on vacation until 9/2. Can you give this guys a call to see if he has any specific post operative concerns?? It looks like he was originally scheduled for bilateral, but according to the interim op note, there was no hernia found on the right side which is why they only repaired the left.    If he is adamant about speaking to DB, I can text him and see if he can give him a call. Not sure if he's out of town yet or not.   ----- Message -----  From: Albin Fischer  Sent: 01/30/2019  10:35 AM EDT  To: Francene Finders, [place], [place]  Subject: Questioning his surgery                          Pt had inguinal hernia surgery yesterday with DB. Calling to say that his support person never got a call from DB after the surgery and that pt never seen DB after the surgery. Also stating that  discharge paper says he had left robotic assist repair, Patient states he went in for bilateral inguinal hernia repair, says he does have tenderness on the left side but nothing on the right side.

## 2019-02-05 ENCOUNTER — Encounter: Payer: Self-pay | Admitting: Surgery

## 2019-02-11 ENCOUNTER — Encounter: Payer: Self-pay | Admitting: Surgery

## 2019-02-11 ENCOUNTER — Ambulatory Visit: Payer: BLUE CROSS/BLUE SHIELD | Admitting: Surgery

## 2019-02-11 DIAGNOSIS — Z9889 Other specified postprocedural states: Secondary | ICD-10-CM

## 2019-02-11 DIAGNOSIS — Z8719 Personal history of other diseases of the digestive system: Secondary | ICD-10-CM

## 2019-02-11 DIAGNOSIS — Z4889 Encounter for other specified surgical aftercare: Secondary | ICD-10-CM | POA: Insufficient documentation

## 2019-02-11 HISTORY — DX: Other specified postprocedural states: Z98.890

## 2019-02-11 HISTORY — DX: Personal history of other diseases of the digestive system: Z87.19

## 2019-02-11 NOTE — Progress Notes (Signed)
Post-operative Visit    Video Visit     Location of Patient: home    Location of Telemedicine Provider: hospital / clinical location    This is an established patient visit.    Reason for visit: post-op follow up      Consent was obtained from the patient to complete this video visit; including the potential for financial liability.        Timothy Lyday, PA        Subjective:       Timothy Cross presents to the clinic status post left inguinal hernia repair robot-assisted(left hernia site intact from old repair) on 01/29/19 by Dr. Quay Burow.  Timothy Cross is eating a regular diet without difficulty.    Bowel movements are normal. Pain is controlled without any medications.Marland Kitchen He feels sore and does feel pulling sensation when trying to rest flat,otherwise feels well.He feels incisions are healing well.            Pathology reviewed: N/A    Assessment:      Timothy Cross is doing well postoperatively.  He asked about details of surgery. He was supposed to have bilateral repairs. Per op note right side was intact from previous repair and therefore the left side was done only.     Plan:      1. Continue any current medications.  2. Wound care discussed.  3. Pt is to increase activities as tolerated.  4. Follow up appointment as needed. Patient instructed to call office with any questions or concerns.  5. Return to work: NA-he states he is retired

## 2019-02-13 ENCOUNTER — Ambulatory Visit: Payer: 59 | Admitting: Bariatrics

## 2019-02-13 NOTE — Anesthesia Postprocedure Evaluation (Signed)
Anesthesia Post-Op Note    Patient: Timothy Cross    Procedure(s) Performed:  Procedure Summary  Date:  01/29/2019 Anesthesia Start: 01/29/2019 11:44 AM Anesthesia Stop: 01/29/2019  1:35 PM Room / Location:  [id number] MAIN OR   Procedure(s):  LAPAROSCOPIC REPAIR WITH MESH, HERNIA, LEFT INGUINAL, ROBOT-ASSISTED Diagnosis:  Bilateral inguinal hernia without obstruction or gangrene, recurrence not specified [K40.20] Surgeon(s):  Quay Burow Virl Diamond, MD  Maudry Mayhew, MD  Cherylann Banas, MD Responsible Anesthesia Provider:  Leland Johns II, MD         Recovery Vitals  No data recorded  Anesthesia type:  general  Complications Noted During Procedure or in PACU:  No value filed.   Comment: No value filed.  Patient Location:  [facility]  Level of Consciousness:    Recovered to baseline  Patient Participation:     Able to participate  Temperature Status:    Normothermic  Oxygen Saturation:    Within patient's normal range  Cardiac Status:   Within patient's normal range  Fluid Status:    Stable  Airway Patency:     Yes  Pulmonary Status:    Baseline  Pain Management:    Adequate analgesia  Nausea and Vomiting:  None    Post Op Assessment:    Tolerated procedure wellAttending Attestation:  All indicated post anesthesia care provided  Comments:    Pt seen in [facility] prior discharge and reviewed [facility] chart up until discharge by nursing staff.     -

## 2019-02-13 NOTE — Anesthesia Case Conclusion (Signed)
CASE CONCLUSION  Emergence  Actions:  Suctioned, soft bite block and extubated  Criteria Used for Airway Removal:  Adequate Tv & RR and acceptable O2 saturation  Assessment:  Routine  Transport  Directly to: PACU  Airway:  Facemask  Oxygen Delivery:  10 lpm  Position:  Recumbent  Patient Condition on Handoff  Level of Consciousness:  Alert/talking/calm  Patient Condition:  Stable  Handoff Report to:  RN

## 2019-02-17 ENCOUNTER — Ambulatory Visit: Payer: BLUE CROSS/BLUE SHIELD | Admitting: Sports Medicine

## 2019-02-17 ENCOUNTER — Encounter: Payer: Self-pay | Admitting: Sports Medicine

## 2019-02-17 VITALS — BP 123/60 | HR 64 | Ht 72.01 in | Wt 150.0 lb

## 2019-02-17 DIAGNOSIS — M1611 Unilateral primary osteoarthritis, right hip: Secondary | ICD-10-CM

## 2019-02-17 MED ORDER — LIDOCAINE HCL 2 % IJ SOLN *I*
1.0000 mL | Freq: Once | INTRAMUSCULAR | Status: AC | PRN
Start: 2019-02-17 — End: 2019-02-17
  Administered 2019-02-17: 1 mL via INTRA_ARTICULAR

## 2019-02-17 MED ORDER — LIDOCAINE HCL 2 % IJ SOLN *I*
2.0000 mL | Freq: Once | INTRAMUSCULAR | Status: AC | PRN
Start: 2019-02-17 — End: 2019-02-17
  Administered 2019-02-17: 2 mL via INTRA_ARTICULAR

## 2019-02-17 MED ORDER — BETAMETHASONE ACET & SOD PHOS 6 (3-3) MG/ML IJ SUSP *I*
12.0000 mg | Freq: Once | INTRAMUSCULAR | Status: AC | PRN
Start: 2019-02-17 — End: 2019-02-17
  Administered 2019-02-17: 12 mg via INTRA_ARTICULAR

## 2019-02-17 MED ORDER — LIDOCAINE HCL 2 % IJ SOLN *I*
5.0000 mL | Freq: Once | INTRAMUSCULAR | Status: AC | PRN
Start: 2019-02-17 — End: 2019-02-17
  Administered 2019-02-17: 10:00:00 5 mL via INTRA_ARTICULAR

## 2019-02-17 NOTE — Progress Notes (Signed)
C.C.: Presents for right hip pain FU .    HPI: Patient states symptoms started several years ago.  Denies injury or trauma.  Reports gradual onset over the years, worsens with activity and as of lately. Was seen in2018 and pt deferred tx at that time.  He notes some gradual worseningt.       Symptoms are located right groin and lateral hip, does radiate along lateral thigh.       Symptoms are Intermittent, and are worsening since onset.  Pain is described as aching, rated as a 0/10 currently but as bad as 6 /10. .  Aggravated by ambulation/activity or driving, putting socks on, alleviated with nothing.  Denies swelling, redness, warmth, bruising.  Reports clicking and popping, denies instability/giving out.  Denies locking.        Patient has tried:  []  icing  []  elevation   [x]  rest  []  heat  [x]  anti-inflammatories: ibuprofen 400mg  as needed in the past, now taking BID  Aleve most days  []  Tylenol  []  injection:  []  PT  []  Brace  []  Chiropractic  []  Massage therapy  []  Acupuncture  []  Other: ___    Activities: golf, walking.    Employment: Pt is now retired  S/p his wife's death    Patient's medications, allergies, and surgical histories: Per patient questionnaire, reviewed and confirmed.  Past medical history: Per patient questionnaire, reviewed and confirmed.  Patient did not complete.  Social history: Per patient questionnaire, reviewed and confirmed.   reports that he has never smoked. He has never used smokeless tobacco..  Family history: Per patient questionnaire, reviewed and confirmed.  Patient did not complete.    Evaluation to date: I personally reviewed patient's X-ray which shows moderate-advanced joint space loss with subchondral cystic change and osteophyte formation.  A radiologist report is available for review.     ROS:  Review of systems:  Per patient questionnaire.  Details include: negative.  Denies fever, chills, rash, night sweats, unintentional weight loss.  All other systems negative.        OBJECTIVE:   Vital signs:    Vitals:    02/17/19 0840   BP: 123/60   Pulse: 64   Weight: 68 kg (150 lb)   Height: 1.829 m (6' 0.01")     General: comfortable male  Mental Status:  Alert and oriented x3, pleasant and cooperative with exam  Extremities: All 4 extremities without edema, clubbing or cyanosis  Gait: Non-antalgic, able to toe walk, heel walk without difficulty.     Spine: No swelling, erythema, ecchymosis or step-off deformity.  Patient is nontender to palpation midline or over paravertebral soft tissue.  Nontender to palpation over SI joints bilaterally.  Full active range of motion, increased pain with right axial rotation.  Lower extremity strength is 5/5, symmetric compared to contralateral leg.  Neurovascularly intact. (-) SLR bilaterally.    Hip and Pelvis:  No swelling, erythema, ecchymosis or deformity. No obvious effusion, no warmth. TTP: non-TTP. There is some diminished range with pain.  Neurovascularly intact.  Full strength with hip flexion, extension, abduction and adduction without pain.     Patrick's / FABER test: positive.     FADIR test: positive.    Hip scouring: positive.       ASSESSMENT / PLAN:  Impression:   58 y.o. male with moderate-advanced R hip OA.  His symptoms are progressively worsening    Plan:   -Discussed PT, patient defers  -Reviewed medication options  prn for pain - 2 tabs BID  -Encouraged activity as tolerated  -Offered CSI, patient agrees - see procedure note  -Reviewed referral for arthroplasty - pt would be good candidate but he is a bit younger than ideal  -f/u 2-3 months    Questions solicited and answered.     Precautions reviewed.  Medication changes, refills reviewed including directions, side effects and monitoring

## 2019-02-17 NOTE — Patient Instructions (Signed)
Injection Instructions    · The injection you received today may take 5-7 days to work in the case of cortisone or 3 weeks or more for hyaluronic acid viscosupplements (Synvics, Hyalgan, Orthovisc or others).   · You may find the area that was injected to be more painful for 1 or 2 days after the injection.  This happens as the medicine is being absorbed in your body.  · It may be helpful to put ice on the body part that was injected to ease the pain.  · You may also use pain medication - Tylenol, Advil/Motrin or Aleve as appropriate.  · A cortisone injection may cause systemic reactions, including flushing, feeling hot, irritability or inability to sleep  · If you are diabetic, a cortisone injection can increase your blood sugar level for several days.  Monitor your glucose levels closely.  · Contact the office [phone] if you feel ill, have excessive pain, if the area turns red or you have a fever

## 2019-02-17 NOTE — Procedures (Signed)
Large Joint Aspiration/Injection Procedure: R hip joint    Date/Time: 02/17/2019  8:30 AM EDT  Consent given by: patient  Site marked: site marked  Timeout: Immediately prior to procedure a time out was called to verify the correct patient, procedure, equipment, support staff and site/side marked as required     Procedure Details    Location: hip - R hip joint  Preparation: The site was prepped using the usual aseptic technique.  Ultrasound guidance:  Ultrasound was utilized to improve needle visualization, injection accuracy, and anatomic localization.    Anesthetics administered: 5 mL lidocaine 2 %; 1 mL lidocaine 2 %; 2 mL lidocaine 2 %  Intra-Articular Steroids administered: 12 mg betamethasone acetate & sodium phosphate 6 (3-3) MG/ML  Dressing:  A dry, sterile dressing was applied.  Patient tolerance: patient tolerated the procedure well with no immediate complications

## 2019-02-18 ENCOUNTER — Ambulatory Visit: Payer: BLUE CROSS/BLUE SHIELD | Admitting: Urology

## 2019-02-18 VITALS — BP 115/61 | HR 60

## 2019-02-18 DIAGNOSIS — C61 Malignant neoplasm of prostate: Secondary | ICD-10-CM

## 2019-02-18 LAB — POCT URINALYSIS DIPSTICK
Glucose,UA POCT: NORMAL mg/dL
Ketones,UA POCT: NEGATIVE mg/dL
Leuk Esterase,UA POCT: NEGATIVE
Lot #: 43799603
Nitrite,UA POCT: NEGATIVE
PH,UA POCT: 5 (ref 5–8)
Specific gravity,UA POCT: 1.02 (ref 1.002–1.030)

## 2019-02-18 MED ORDER — TADALAFIL 20 MG PO TABS *I*
10.0000 mg | ORAL_TABLET | Freq: Every day | ORAL | 0 refills | Status: DC | PRN
Start: 2019-02-18 — End: 2019-02-20

## 2019-02-18 NOTE — Progress Notes (Signed)
Due to [id number] and hospital policy, this visit was conducted over the phone. Patient consent obtained.     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    He reports no voiding difficulties and is feeling well.  He does report ED. He is able to obtain an erection but cannot maintain very well and it is more difficult to get an erection. Has not tried any medications.     Results for Timothy Cross, Timothy Cross (MRN [id number]) as of 08/22/2018 17:59   Ref. Range 10/07/2017 15:01 02/07/2018 09:56 05/14/2018 09:46 07/21/2018 11:25   PSA (eff. 09-2008) Latest Ref Range: 0.00 - 4.00 ng/mL 4.18 (H) 1.79 1.38 1.03     Lab Results   Component Value Date    PSAR3 0.57 01/21/2019    PSAR3 0.68 11/25/2018    PSAR3 1.03 07/21/2018        Medications:   Current Outpatient Medications   Medication    oxyCODONE (ROXICODONE) 5 MG immediate release tablet    naproxen sodium (ANAPROX) 220 MG tablet    ibuprofen (ADVIL,MOTRIN) 800 MG tablet    acetaminophen (TYLENOL) 325 MG tablet     No current facility-administered medications for this visit.        Allergies:   Allergies   Allergen Reactions    Environmental Allergies Other (See Comments)     sneezing       Past medical history:   Past Medical History:   Diagnosis Date    Anxiety     Arthritis     hip    Cancer     Colon polyp        Past surgical history:   Past Surgical History:   Procedure Laterality Date    HERNIA REPAIR      INGUINAL HERNIA REPAIR      KNEE SURGERY Bilateral     arthroscopy ACL reconstruction    PR LAP, VENTRAL HERNIA REPAIR,REDUCIBLE N/A 10/29/2016    Procedure:  UMBILICAL HERNIA REPAIR;  Surgeon: Suszanne Finch, MD;  Location: [facility] MAIN OR;  Service: General    PR [place] Bilateral 10/29/2016     Procedure:  LAPAROSCOPIC BILATERAL  INGUINAL HERNIA REPAIR with mesh;  Surgeon: Suszanne Finch, MD;  Location: [facility] MAIN OR;  Service: General    PR [place] Left 01/29/2019    Procedure: LAPAROSCOPIC REPAIR WITH MESH, HERNIA, LEFT INGUINAL, ROBOT-ASSISTED;  Surgeon: Suszanne Finch, MD;  Location: [facility] MAIN OR;  Service: General    UMBILICAL [place]      Surgery Of Male Genitalia Vasectomy Conversion Data        Family history:   Family History   Problem Relation Age of Onset    Conversion Other         [id number]^Large Intestine Cancer^V16.0^Active^    Cancer Mother     Cancer Brother        Social History:   Social History     Socioeconomic History    Marital status: Widowed     Spouse name: Not on file    Number of children: Not on file    Years of education: Not on file    Highest education level: Not  on file   Occupational History    Not on file   Tobacco Use    Smoking status: Never Smoker    Smokeless tobacco: Never Used   Substance and Sexual Activity    Alcohol use: Yes     Types: 1 Glasses of wine, 4 Cans of beer per week     Comment: 3-4 drinks a week     Drug use: No    Sexual activity: Yes     Partners: Female     Birth control/protection: None     Comment: vasectomy   Social History Narrative    Not on file   REVIEW of SYSTEMS    Constitutional: Negative.  Eyes: Negative  Allergic/Immunologic: located in allergy section  Neurological: Negative  Endocrine:Negative  Gastrointestinal: Negative  Cardiovascular: Negative  Integumentary: Negative  Musculoskeletal: Negative  HEENT (Head/Ear/Nose/Throat/Mouth): Negative  GU: See above.  Respiratory: Negative  Hematological: Negative  Behavorial: Negative  Psychological: Denies depression, or suicidal thoughts        Assessment:THis patient who has a history of prostate cancer who underwent brachytherapy one year ago. His PSA continues to descend. He is feeling well otherwise. He has moderate ED and would  like to try medications. R/B/A discussed.     Plan: Timothy Cross will follow up in 1 year as he is presently doing well. Repeat labs 6 months and 1 year.     The plan was discussed with the patient and the patient/patient rep demonstrated understanding to the provider's satisfaction.      Pollyann Kennedy, PA 02/18/2019 3:23 PM

## 2019-02-18 NOTE — Patient Instructions (Signed)
[facility]  [address]  [phone]

## 2019-02-19 ENCOUNTER — Other Ambulatory Visit: Payer: Self-pay | Admitting: Urology

## 2019-02-19 DIAGNOSIS — C61 Malignant neoplasm of prostate: Secondary | ICD-10-CM

## 2019-02-19 NOTE — Addendum Note (Signed)
Addended by: Pollyann Kennedy on: 02/19/2019 08:43 AM     Modules accepted: Orders

## 2019-02-20 NOTE — Telephone Encounter (Signed)
Timothy Cross,   Looks like you send prescription for cialis to pharmacy back on 02/18/19  Thanks [employer]

## 2019-02-23 ENCOUNTER — Telehealth: Payer: Self-pay | Admitting: Urology

## 2019-02-23 NOTE — Telephone Encounter (Signed)
Please contact Bill and let him know the dip in the office showed a small amount of blood. This should be repeated at the lab for a formal UA. Lab is placed.

## 2019-02-24 ENCOUNTER — Other Ambulatory Visit
Admission: RE | Admit: 2019-02-24 | Discharge: 2019-02-24 | Disposition: A | Discharge status: Home / Self Care | Payer: BLUE CROSS/BLUE SHIELD | Source: Ambulatory Visit | Attending: Urology | Admitting: Urology

## 2019-02-24 DIAGNOSIS — C61 Malignant neoplasm of prostate: Secondary | ICD-10-CM

## 2019-02-24 LAB — URINALYSIS WITH MICROSCOPIC
Bacteria,UA: NONE SEEN
Blood,UA: NEGATIVE
Glucose,UA: NEGATIVE mg/dL
Hyaline Casts,UA: NONE SEEN /lpf (ref 0–5)
Ketones, UA: NEGATIVE
Leuk Esterase,UA: NEGATIVE
Nitrite,UA: NEGATIVE
Protein,UA: NEGATIVE mg/dL
Specific Gravity,UA: 1.016 (ref 1.002–1.030)
WBC,UA: NONE SEEN /hpf (ref 0–5)
pH,UA: 7 (ref 5.0–8.0)

## 2019-02-24 NOTE — Telephone Encounter (Signed)
Patient notified of below, verbalized understanding.

## 2019-02-25 LAB — AEROBIC CULTURE: Aerobic Culture: 0

## 2019-03-20 ENCOUNTER — Encounter: Payer: Self-pay | Admitting: Orthopedic Surgery

## 2019-03-24 ENCOUNTER — Encounter: Payer: Self-pay | Admitting: Orthopedic Surgery

## 2019-03-24 ENCOUNTER — Ambulatory Visit: Payer: BLUE CROSS/BLUE SHIELD | Admitting: Orthopedic Surgery

## 2019-03-24 ENCOUNTER — Ambulatory Visit
Admission: RE | Admit: 2019-03-24 | Discharge: 2019-03-24 | Disposition: A | Discharge status: Home / Self Care | Payer: BLUE CROSS/BLUE SHIELD | Source: Ambulatory Visit | Attending: Orthopedic Surgery | Admitting: Orthopedic Surgery

## 2019-03-24 VITALS — BP 119/62 | HR 57 | Ht 72.0 in | Wt 150.0 lb

## 2019-03-24 DIAGNOSIS — M25562 Pain in left knee: Secondary | ICD-10-CM

## 2019-03-24 DIAGNOSIS — D481 Neoplasm of uncertain behavior of connective and other soft tissue: Secondary | ICD-10-CM

## 2019-03-24 DIAGNOSIS — M85862 Other specified disorders of bone density and structure, left lower leg: Secondary | ICD-10-CM | POA: Insufficient documentation

## 2019-03-24 DIAGNOSIS — M25462 Effusion, left knee: Secondary | ICD-10-CM | POA: Insufficient documentation

## 2019-03-24 DIAGNOSIS — M1712 Unilateral primary osteoarthritis, left knee: Secondary | ICD-10-CM | POA: Insufficient documentation

## 2019-03-24 DIAGNOSIS — G8929 Other chronic pain: Secondary | ICD-10-CM | POA: Insufficient documentation

## 2019-03-24 NOTE — Progress Notes (Signed)
[facility] NOTE FOR 03/24/2019         Patient: Timothy Cross  MRN: [id number]  Date of visit: 03/24/2019     Reason for visit: Follow-up for left knee pain.    Interval history:   Patient was last seen in our clinic on 04/09/2017 for intermittent right knee pain secondary to an osteochondral loose body.  He is being followed by nonoperative orthopedics for bilateral hip pain.  Patient presents to clinic today with 3 to 4 months of progressive left knee pain.  The pain came on insidiously, without inciting trauma or injury.  The pain is primarily focal over the proximal tib-fib joint and sometimes radiates up his lateral thigh along the IT band and along the anterior shin.  The pain is a dull ache at rest which becomes more severe with activity, up to a 6/10.  The pain is aggravated with walking long distances, driving, or standing with his knee locked.  At baseline he tries to walk 4 to 5 miles daily regardless of weather, but he has been unable to keep this out over the past couple months due to this pain.  The pain is somewhat relieved by twice daily Aleve and ice.  He has been treated for right hip osteoarthritis with nonoperative management including cortisone injection and home exercises.  He has not done any formal physical therapy.  His exercises do not alleviate his knee pain.  He has started bicycling on a stationary bike, which feels better than walking.  The patient notes that he has not had any recent pain in his right knee.      Patient's medications, allergies, past medical, surgical, social and family histories were reviewed and updated as appropriate.    Review of systems:  Pertinent positives/negatives: see interval history above  Otherwise negative for 12 system review      Physical Exam:  There were no vitals filed for this visit.    General: well-appearing, well-nourished, alert, and in no acute distress  Head and neck: Normocephalic. Full neck ROM  without pain   Psych: Appropriate mood and affect. Alert and oriented to person, place, time  Skin: no pallor. No erythema. No rashes or lesions on visible skin  CV: no pitting edema.  No varicosities  Pulmonary: nml respiratory effort on RA. No audible wheezing  Neuro: no tremor. SILT  Lymphatic: No lymphadenopathy at the BLE  MSK: Mild genu varum bilaterally.  Examination of the left lower extremity reveals mild focal tenderness to palpation over the proximal tib-fib joint.  Mild fullness posterior to the fibular head.  Otherwise no focal tenderness to palpation including around the anterior knee or medial or lateral joint space.  No knee effusion noted.  Tight IT band and biceps femoris tendons.  Painless active and passive knee range of motion from 0 to 130 degrees.  Knee stable to ligamentous exam including varus and valgus stress.  Firm endpoint on Lachman.  Negative McMurray's.  5/5 strength with hip flexion, knee extension, ankle dorsiflexion and plantarflexion, EHL.  Sensation intact light touch over distal extremity.  Foot warm and well-perfused.      Imaging:All imaging personally reviewed by me today.   Left knee radiographs obtained in clinic today and reviewed.  Demonstrate mild to moderate tricompartmental degenerative changes including joint space narrowing and marginal osteophytes, worse in the medial compartment.  Visualization of the proximal tib-fib joint on the lateral view demonstrates moderate to severe degenerative changes  with an anterior marginal osteophyte.    Assessment/Plan: 58 y.o. male with progressive atraumatic lateral left knee pain centered over the proximal tib-fib joint.  Given the subacute nature of his pain without inciting trauma, it is possible that the patient has a ganglion cyst at the proximal tib-fib joint with associated degenerative changes.  We will plan to obtain an MRI of the left knee without contrast to investigate this further.  The patient also has substantial  hamstring and IT band tightness, and we have recommended physical therapy to focus on hamstring and IT band stretching as well as quadricep strengthening.  A referral was placed today.  The patient will follow-up in clinic after the MRI to discuss further management.  Patient is happy with the plan.  All questions reviewed and answered.      Patient seen and examined with Dr. Evelena Asa.    Sharyn Dross, MD PhD  Orthopaedic Surgery  8:07 AM 03/24/2019     I have seen and examined the patient and agree with the above findings, assessment, and plan.  Jerene Pitch, MD  03/24/2019  2:12 PM

## 2019-03-26 ENCOUNTER — Encounter: Payer: Self-pay | Admitting: Primary Care

## 2019-04-02 ENCOUNTER — Ambulatory Visit: Payer: BLUE CROSS/BLUE SHIELD

## 2019-04-07 ENCOUNTER — Ambulatory Visit
Admission: RE | Admit: 2019-04-07 | Discharge: 2019-04-07 | Disposition: A | Discharge status: Home / Self Care | Payer: BLUE CROSS/BLUE SHIELD | Source: Ambulatory Visit | Attending: Orthopedic Surgery | Admitting: Orthopedic Surgery

## 2019-04-07 DIAGNOSIS — M23322 Other meniscus derangements, posterior horn of medial meniscus, left knee: Secondary | ICD-10-CM | POA: Insufficient documentation

## 2019-04-07 DIAGNOSIS — M25562 Pain in left knee: Secondary | ICD-10-CM

## 2019-04-07 DIAGNOSIS — M2352 Chronic instability of knee, left knee: Secondary | ICD-10-CM

## 2019-04-07 DIAGNOSIS — M1712 Unilateral primary osteoarthritis, left knee: Secondary | ICD-10-CM | POA: Insufficient documentation

## 2019-04-10 ENCOUNTER — Encounter: Payer: Self-pay | Admitting: Gastroenterology

## 2019-04-10 ENCOUNTER — Other Ambulatory Visit: Payer: Self-pay | Admitting: Primary Care

## 2019-04-10 ENCOUNTER — Ambulatory Visit: Payer: BLUE CROSS/BLUE SHIELD | Admitting: Primary Care

## 2019-04-10 ENCOUNTER — Encounter: Payer: Self-pay | Admitting: Primary Care

## 2019-04-10 VITALS — BP 112/58 | HR 60 | Ht 72.0 in | Wt 145.6 lb

## 2019-04-10 DIAGNOSIS — Z8546 Personal history of malignant neoplasm of prostate: Secondary | ICD-10-CM

## 2019-04-10 DIAGNOSIS — Z8719 Personal history of other diseases of the digestive system: Secondary | ICD-10-CM

## 2019-04-10 DIAGNOSIS — M159 Polyosteoarthritis, unspecified: Secondary | ICD-10-CM

## 2019-04-10 DIAGNOSIS — M25552 Pain in left hip: Secondary | ICD-10-CM

## 2019-04-10 DIAGNOSIS — Z9889 Other specified postprocedural states: Secondary | ICD-10-CM

## 2019-04-10 DIAGNOSIS — G8929 Other chronic pain: Secondary | ICD-10-CM

## 2019-04-10 DIAGNOSIS — M25562 Pain in left knee: Secondary | ICD-10-CM

## 2019-04-10 DIAGNOSIS — Z23 Encounter for immunization: Secondary | ICD-10-CM

## 2019-04-10 DIAGNOSIS — Z0000 Encounter for general adult medical examination without abnormal findings: Secondary | ICD-10-CM

## 2019-04-10 DIAGNOSIS — M25551 Pain in right hip: Secondary | ICD-10-CM | POA: Insufficient documentation

## 2019-04-10 DIAGNOSIS — E7800 Pure hypercholesterolemia, unspecified: Secondary | ICD-10-CM

## 2019-04-10 LAB — HM HIV SCREENING OFFERED

## 2019-04-10 LAB — PCMH DEPRESSION ASSESSMENT

## 2019-04-10 NOTE — Progress Notes (Signed)
Reason for visit:   CC: Timothy Cross is 58 y.o. year old male coming in for a preventive health care physical  HPI:     He is doing well without any acute complaints.    Chronic left knee pain: He is following with orthopedic and recently had an MRI.  He will follow up with orthopedics in regards to further management.    Still has issues with bilateral hip pain and his right knee as well.  He is working with a Physiological scientist to help gain 5 pounds safely.    Prostate cancer status post TRUS with biopsy: He remains in remission, he continues to follow-up routinely with urology.    Hypercholesterolemia: Managing with diet alone.    SCREENING HISTORY  Colonoscopy Yes due now      Patient Active Problem List    Diagnosis Date Noted    Other specified aftercare following surgery 02/11/2019    S/P left inguinal hernia repair 02/11/2019    Prostate cancer 06/17/2017    Elevated PSA 04/08/2017    Inguinal hernia bilateral, non-recurrent 03/06/2016    Prostate nodule without urinary obstruction 03/11/2014    ACL tear 02/27/2013    Personal history of colonic polyps 02/27/2013    Hyperlipidemia 12/10/2006     Created by Conversion        Osteoarthritis 12/10/2006     Created by Conversion         Medications:     Current Outpatient Medications   Medication Sig    tadalafil (CIALIS) 20 MG tablet take 0.5-1 tablets (10-20 MILLIGRAM total) by mouth daily as needed for erectile dysfunction take at least 30 minutes prior to sexual activity.    naproxen sodium (ANAPROX) 220 MG tablet Take 220 mg by mouth 2 times daily (with meals)    ibuprofen (ADVIL,MOTRIN) 800 MG tablet Take 800 mg by mouth 3 times daily as needed for Pain    acetaminophen (TYLENOL) 325 MG tablet Take 650 mg by mouth every 6 hours as needed for Pain     No current facility-administered medications for this visit.      Medication list reconciled this visit  Allergies:     Allergies   Allergen Reactions    Environmental Allergies Other (See  Comments)     sneezing     Immunizations:     Most Recent Immunizations   Administered Date(s) Administered    Hep B/HiB (Comvax) 04/17/2013    Hepatitis A Adult 02/24/2013    IPV 02/24/2013    Influenza Adult(25yrand up) 02/28/2012    Influenza PF(3 year and up) 02/24/2013    Influenza Quadrivalent 0.528mprefilled syringe/single dose vial(FluLaval,Fluzone,Afluria,Fluarix) 04/08/2018    Influenza cell-based prefilled syringe (Flucelvax)>/=4y49yr0/29/2018    Tdap 02/24/2013    Typhoid Inactivated 11/24/2012    Zoster(Shingrix) 12/31/2018     Past Medical History:     Past Medical History:   Diagnosis Date    Anxiety     Arthritis     hip    Cancer     Colon polyp       Past Surgical History:   Procedure Laterality Date    HERNIA REPAIR      INGUINAL HERNIA REPAIR      KNEE SURGERY Bilateral     arthroscopy ACL reconstruction    PR LAP, VENTRAL HERNIA REPAIR,REDUCIBLE N/A 10/29/2016    Procedure:  UMBILICAL HERNIA REPAIR;  Surgeon: BurSuszanne FinchD;  Location: [facility];  Service:  General    PR LAP,INGUINAL HERNIA REPR,INITIAL Bilateral 10/29/2016    Procedure:  LAPAROSCOPIC BILATERAL  INGUINAL HERNIA REPAIR with mesh;  Surgeon: Suszanne Finch, MD;  Location: [facility] MAIN OR;  Service: General    PR [place] Left 01/29/2019    Procedure: LAPAROSCOPIC REPAIR WITH MESH, HERNIA, LEFT INGUINAL, ROBOT-ASSISTED;  Surgeon: Suszanne Finch, MD;  Location: [facility] MAIN OR;  Service: General    UMBILICAL HERNIA REPAIR      VASECTOMY      Surgery Of Male Genitalia Vasectomy Conversion Data        Family History:     Family History   Problem Relation Age of Onset    Conversion Other         20080701^Large Intestine Cancer^V16.0^Active^    Cancer Mother     Cancer Brother       Social History      Social History     Tobacco Use    Smoking status: Never Smoker    Smokeless tobacco: Never Used   Substance Use Topics    Alcohol use: Yes     Types: 1 Glasses of wine, 4 Cans of beer per week      Comment: 3-4 drinks on the weekend       PMH/PSH/FH/SH all reviewed with pt and updated as appropriate  Review of Systems   Constitutional: Feels generally well; no fevers, night sweats or unintentional weight loss.  Eyes: No visual changes, no eye pain   ENT: No hearing difficulties, no ear pain, no sore throat, UTD with twice yearly dental visits  CV: No chest pain, palpitations, orthopnea, or leg swelling  Respiratory: No cough, wheezing or dyspnea  GI: No nausea/vomiting, abdominal pain, or change in bowel habits  GU: No dysuria, no change in urinary function.    MS: Bilateral knee pain and hip pain, no axial problems   Skin: No rashes or new/changing skin lesions  Neuro: No headaches; no problems with speech or cognition or memory, no arm or leg weakness, no numbness or tingling  Psych: No problems with depression or anxiety   Endocrine: No polyuria or polydipsia, no heat or cold intolerance  Heme/lymph: No easy bleeding or bruising; no swollen glands  All/Immun: No allergic reactions  Other: Diet-healthy;  regular exercise-yes; seat belt-wears;  recreational drugs-not using;  hx abuse (sexual, physical, verbal)-no;  hx STD's-no    Physical Exam:     Vitals:    04/10/19 0932   BP: 112/58   BP Location: Left arm   Patient Position: Sitting   Cuff Size: adult   Pulse: 60   SpO2: 100%   Weight: 66 kg (145 lb 9.6 oz)   Height: 1.829 m (6')     Pain    04/10/19 0932   PainSc:   0 - No pain     Wt Readings from Last 3 Encounters:   04/10/19 66 kg (145 lb 9.6 oz)   03/24/19 68 kg (150 lb)   02/17/19 68 kg (150 lb)     BP Readings from Last 3 Encounters:   04/10/19 112/58   03/24/19 119/62   02/18/19 115/61       Vital Signs. As above  General: Appears healthy, comfortable, pleasant.  Skin: Normal texture, no rash or suspicious lesions.    Head: Atraumatic.    Eyes: Lids appear normal, no conjunctival injection or icterus.  PERRL;EOMI without nystagmus.   Irises normal.   ENT: Inspection reveals normal  appearing ears  and nose.  Otoscopic exam shows clean external auditory canals; tympanic membranes shiny and normal.  Hearing is intact to normal conversational tones.  Nostrils patent.  Lips and gums are normal and teeth in adequate repair.  Mucous membranes are moist. No oral lesions; pharynx without redness or exudate.    Neck: Supple, no masses, trachea is midline.  No thyroid enlargement, tenderness, or nodules.   Chest wall:  Symmetric.    Breasts: No masses or nipple inversion  Resp: Normal respiratory effort. Excellent air entry.  Normal vesicular breath sounds without rales or wheezing.    CV: Apical impulse normal.  Regular rhythm.  Normal S1/S2.  No murmur/rub/gallop.  Carotid pulses full and without bruits.  Abdominal aorta does not feel enlarged, no bruits.  Pedal pulses intact.  No edema.    GI: Nondistended.  BS normoactive.  Soft, no masses or tenderness.  No hepatosplenomegaly.   GU: Deferred to Urology  Lymphatic:  No cervical or supraclavicular adenopathy.    MSK:  Normal gait and station.  Atraumatic.  No swelling or deformity of joints, muscles without atrophy. Neuro:  Cranial nerves II-XII intact. No focal deficits of motor or sensory system.  DTR's symmetric.   Psych: Alert, Ox3.  Insight and judgment seem normal.  Mood/affect normal. Recent and remote memory intact.    RESULTS:     Labs reviewed prior to visit.    EKG: Normal sinus rhythm, heart rate approximately 60, normal axis, no acute ST segment changes, similar to prior    ASSESSMENT/PLAN:     Preventive health care:   --Aerobic exercise and diet discussed  --Colon CA screening discussed, written information provided  --STD risk counseling and prevention discussed  --Skin CA awareness/prevention discussed  --Dental care discussed  --Cardiac risk factor modification discussed  --Motor vehicle safety discussed    --IMMUNIZATIONS  Flu shot: No and Will call and schedule an flu clinic   Gardasil: N/A  TdaP: Yes  Hepatitis: N/A  Pneumovax: N/A  MMR:  Unknown    Health Maintenance   Topic Date Due    HIV Screening  01/22/1974    Flu Shot (1) 02/10/2019    Shingles Vaccine (2 of 2) 02/25/2019    Colon Cancer Screening  03/25/2019    Cholesterol Screening  05/15/2019    DEPRESSION SCREEN YEARLY  04/09/2020    Hepatitis C Screening  Completed       Annual physical exam  -     EKG    Immunization due  -     Varicella-Zoster vaccine intramuscular(SHINGRIX)(Shingles)(AMBULATORY USE ONLY))    Chronic pain of left knee, Bilateral hip pain         - Continue management per orthopedics    History of prostate cancer         - Continue management per urology    History of inguinal hernia repair, bilateral         - Stable, intermittent discomfort    Hypercholesteremia          - Continue dietary and physical activity modifications, fasting lipid panel due in December 2020    Handouts given: AVS   Follow-up:    RTO 1 year    Lonie Peak, MD  04/10/2019    *This note was dictated using Dragon [employer], reasonable efforts were made to correct for any dictation errors.

## 2019-04-16 ENCOUNTER — Ambulatory Visit: Payer: BLUE CROSS/BLUE SHIELD | Admitting: Orthopedic Surgery

## 2019-04-16 ENCOUNTER — Encounter: Payer: Self-pay | Admitting: Orthopedic Surgery

## 2019-04-16 ENCOUNTER — Ambulatory Visit: Payer: BLUE CROSS/BLUE SHIELD

## 2019-04-16 VITALS — BP 124/58 | HR 57 | Ht 72.0 in | Wt 145.0 lb

## 2019-04-16 DIAGNOSIS — Z23 Encounter for immunization: Secondary | ICD-10-CM

## 2019-04-16 DIAGNOSIS — M25562 Pain in left knee: Secondary | ICD-10-CM

## 2019-04-16 MED ORDER — GABAPENTIN 300 MG PO CAPSULE *I*
300.0000 mg | ORAL_CAPSULE | Freq: Three times a day (TID) | ORAL | 0 refills | Status: DC
Start: 2019-04-16 — End: 2019-09-25

## 2019-04-16 MED ORDER — DICLOFENAC SODIUM 1 % EX GEL *I*
CUTANEOUS | 2 refills | Status: DC
Start: 2019-04-16 — End: 2019-09-25

## 2019-04-16 NOTE — Progress Notes (Signed)
Orthopaedic Surgery Office Visit: Medical Student Note  Timothy Cross, MRN: [id number]  04/16/2019     CC: Left Knee pain    Presenting HPI:    Timothy Cross is a 58 y.o. male with hx of progressive atraumatic lateral knee pain with underlying degenerative arthritis. He was last seen in the office 03/24/19. He has been going to physical therapy for the past 3 weeks. He notes improved mobility and function on his left knee. But states that he has been having some "discomfort" with the medial knee that has been more prominent with the increased physical therapy. He continues to have pain in the proximal tib-fib area, that has not changed with the physical therapy. The pain is more prominent with physical activity and bending at the knee to pick up something from the floor.       Past medical History, Past surgical History, Medications, Allergies, ROS 12 point, Family history, Social history:  Encounter information and details reviewed per separate area in chart; reviewed with patient.       Objective:    Alert and oriented x 3.  Pleasant, cooperative, appropriate.  Right and Left lower extremities: Palpable pulses, neurovascularly intact.  Calf soft and nontender.  Sensation intact to light touch medial, lateral dorsal and plantar foot   MSK: Patient able to ambulate without difficult. Left knee with any obvious injury, effusion or erythema. Normal range of motion and strength.        Impression:  58 y.o. male, with hx of knee surgery and degenerative changes on recent MRI continues to have pain in the proximal left tib-fib area with physical therapy. MRI shows most of the degenerative changes at the proximal area of the left knee, away from the patient source of continued pain. The patients pain could be from nerve irritation. Patient was prescribed topical Voltaren gel and Neurontin.     Patient was seen and examined with Dr. Evelena Asa.      Noorullah Maqsoodi   MS4

## 2019-04-20 ENCOUNTER — Ambulatory Visit
Admission: AD | Admit: 2019-04-20 | Discharge: 2019-04-20 | Disposition: A | Discharge status: Home / Self Care | Payer: BLUE CROSS/BLUE SHIELD | Source: Ambulatory Visit | Attending: Primary Care | Admitting: Primary Care

## 2019-04-20 ENCOUNTER — Encounter: Payer: Self-pay | Admitting: Primary Care

## 2019-04-20 DIAGNOSIS — Z20822 Contact with and (suspected) exposure to covid-19: Secondary | ICD-10-CM

## 2019-04-20 DIAGNOSIS — Z20828 Contact with and (suspected) exposure to other viral communicable diseases: Secondary | ICD-10-CM | POA: Insufficient documentation

## 2019-04-20 DIAGNOSIS — U071 COVID-19: Secondary | ICD-10-CM | POA: Insufficient documentation

## 2019-04-20 NOTE — ED Triage Notes (Signed)
Patient presenting to Urgent Care for testing only. COVID-19 test ordered by outside provider     Does the patient currently have symptoms concerning for COVID-19?: No     What is the reason for testing?: routine     Nasal swab obtained and sent for analysis.         Triage Note   Legrand Rams, RN

## 2019-04-21 NOTE — Progress Notes (Signed)
CC: Left Knee pain    Presenting HPI:    Mr. Timothy Cross is a 58 y.o. male with hx of progressive atraumatic lateral knee pain with underlying degenerative arthritis. He was last seen in the office 03/24/19. He has been going to physical therapy for the past 3 weeks. He notes improved mobility and function on his left knee. But states that he has been having some "discomfort" with the medial knee that has been more prominent with the increased physical therapy. He continues to have pain in the proximal tib-fib area, that has not changed with the physical therapy. The pain is more prominent with physical activity and bending at the knee to pick up something from the floor. better with rest. 4/10.       Past Medical History:   Diagnosis Date    Anxiety     Arthritis     hip    Cancer     Colon polyp      Past Surgical History:   Procedure Laterality Date    HERNIA REPAIR      INGUINAL HERNIA REPAIR      KNEE SURGERY Bilateral     arthroscopy ACL reconstruction    PR LAP, VENTRAL HERNIA REPAIR,REDUCIBLE N/A 10/29/2016    Procedure:  UMBILICAL HERNIA REPAIR;  Surgeon: Timothy Finch, MD;  Location: [facility] MAIN OR;  Service: General    PR [place] Bilateral 10/29/2016    Procedure:  LAPAROSCOPIC BILATERAL  INGUINAL HERNIA REPAIR with mesh;  Surgeon: Timothy Finch, MD;  Location: [facility]IN OR;  Service: General    PR [place] Left 01/29/2019    Procedure: LAPAROSCOPIC REPAIR WITH MESH, HERNIA, LEFT INGUINAL, ROBOT-ASSISTED;  Surgeon: Timothy Finch, MD;  Location: [facility] OR;  Service: General    UMBILICAL [place]      Surgery Of Male Genitalia Vasectomy Conversion Data      Family History   Problem Relation Age of Onset    Conversion Other         20080701^Large Intestine Cancer^V16.0^Active^    Cancer Mother     Cancer Brother      Social History     Socioeconomic History    Marital status: Widowed     Spouse name: Not on file    Number of  children: Not on file    Years of education: Not on file    Highest education level: Not on file   Tobacco Use    Smoking status: Never Smoker    Smokeless tobacco: Never Used   Substance and Sexual Activity    Alcohol use: Yes     Types: 1 Glasses of wine, 4 Cans of beer per week     Comment: 3-4 drinks on the weekend    Drug use: No    Sexual activity: Yes     Partners: Female     Birth control/protection: None     Comment: vasectomy   Other Topics Concern    Not on file   Social History Narrative    Not on file       Current Outpatient Medications:     tadalafil (CIALIS) 20 MG tablet, take 0.5-1 tablets (10-20 MILLIGRAM total) by mouth daily as needed for erectile dysfunction take at least 30 minutes prior to sexual activity., Disp: 30 tablet, Rfl: 0    diclofenac (VOLTAREN) 1 % gel, Apply 4 g to affected area 4 times daily., Disp:  1 Tube, Rfl: 2    gabapentin (NEURONTIN) 300 MG capsule, Take 1 capsule (300 mg total) by mouth 3 times daily, Disp: 90 capsule, Rfl: 0    naproxen sodium (ANAPROX) 220 MG tablet, Take 220 mg by mouth 2 times daily (with meals), Disp: , Rfl:     ibuprofen (ADVIL,MOTRIN) 800 MG tablet, Take 800 mg by mouth 3 times daily as needed for Pain, Disp: , Rfl:     acetaminophen (TYLENOL) 325 MG tablet, Take 650 mg by mouth every 6 hours as needed for Pain, Disp: , Rfl:   Allergies: .personally reviewed by me and documented in erecord.  A full 12 point ROS was reviewed. MSK complaints per HPI. No constitutional sxs. All other systems reviewed with no pertinent positives or negatives.    Objective:    General: Alert, no acute distress, vital in erecord.    HEENT:Normocephalic. nml sclera.   Psych: Appropriate mood and affect. Alert and oriented to person, place, time  Skin: no pallor. No erythema. No rashes or lesions on visible skin  CV: + PT pulse. No pitting edema. No vericosities of the extremities.  Pulmonary: nml respiratory effort. No audible wheezing  Neuro: no tremor.  SILT      Lymphatic: No lymphadenopathy at the knee or ankle  MSK: Right and Left lower extremities: Palpable pulses, neurovascularly intact.  Calf soft and nontender.    Impression:  58 y.o. male, with hx of knee surgery and degenerative changes on recent MRI continues to have pain in the proximal left tib-fib area with physical therapy. MRI shows most of the degenerative changes at the proximal area of the left knee, away from the patient source of continued pain. The patients pain could be from nerve irritation. Patient was prescribed topical Voltaren gel and Neurontin. He will continue his exercises. If there is no improvement we may consider getting an EMG.  Jerene Pitch, MD  04/21/2019  11:56 AM

## 2019-04-22 ENCOUNTER — Other Ambulatory Visit: Payer: Self-pay | Admitting: Radiation Oncology

## 2019-04-22 DIAGNOSIS — C61 Malignant neoplasm of prostate: Secondary | ICD-10-CM

## 2019-04-22 LAB — COVID-19 PCR

## 2019-04-22 LAB — COVID-19 NAAT (PCR): COVID-19 NAAT (PCR): POSITIVE — AB

## 2019-05-14 ENCOUNTER — Encounter: Payer: Self-pay | Admitting: Sports Medicine

## 2019-05-19 ENCOUNTER — Ambulatory Visit: Payer: BLUE CROSS/BLUE SHIELD | Admitting: Sports Medicine

## 2019-05-19 ENCOUNTER — Encounter: Payer: Self-pay | Admitting: Sports Medicine

## 2019-05-19 VITALS — BP 118/62 | HR 57 | Ht 72.0 in | Wt 150.0 lb

## 2019-05-19 DIAGNOSIS — M1711 Unilateral primary osteoarthritis, right knee: Secondary | ICD-10-CM

## 2019-05-19 DIAGNOSIS — M1611 Unilateral primary osteoarthritis, right hip: Secondary | ICD-10-CM

## 2019-05-19 MED ORDER — BETAMETHASONE ACET & SOD PHOS 6 (3-3) MG/ML IJ SUSP *I*
12.0000 mg | Freq: Once | INTRAMUSCULAR | Status: AC | PRN
Start: 2019-05-19 — End: 2019-05-19
  Administered 2019-05-19: 12 mg via INTRA_ARTICULAR

## 2019-05-19 MED ORDER — LIDOCAINE HCL 2 % IJ SOLN *I*
2.0000 mL | Freq: Once | INTRAMUSCULAR | Status: AC | PRN
Start: 2019-05-19 — End: 2019-05-19
  Administered 2019-05-19: 09:00:00 2 mL via INTRA_ARTICULAR

## 2019-05-19 MED ORDER — LIDOCAINE HCL 2 % IJ SOLN *I*
5.0000 mL | Freq: Once | INTRAMUSCULAR | Status: AC | PRN
Start: 2019-05-19 — End: 2019-05-19
  Administered 2019-05-19: 09:00:00 5 mL via INTRA_ARTICULAR

## 2019-05-19 MED ORDER — LIDOCAINE HCL 2 % IJ SOLN *I*
1.0000 mL | Freq: Once | INTRAMUSCULAR | Status: AC | PRN
Start: 2019-05-19 — End: 2019-05-19
  Administered 2019-05-19: 09:00:00 1 mL via INTRA_ARTICULAR

## 2019-05-19 NOTE — Progress Notes (Signed)
C.C.: Presents for right hip pain FU .    HPI: Patient states symptoms improved with inj in sept bugt then worsened again.    Requesting further tx.     Denies swelling, redness, warmth, bruising.  Reports clicking and popping, denies instability/giving out.  Denies locking.        Patient has tried:  []  icing  []  elevation   [x]  rest  []  heat  [x]  anti-inflammatories: ibuprofen 400mg  as needed in the past, now taking BID  Aleve most days  []  Tylenol  [x]  injection: 02/2019   PT  []  Brace  []  Chiropractic  []  Massage therapy  []  Acupuncture  []  Other: ___    Answers for HPI/ROS submitted by the patient on 05/14/2019   Right hip pain  What is your goal for today's visit?: possibly cortisone shot rt. hip  Was this the result of an injury?: No  What is your pain level?: 5/10  Please describe the quality of your pain: : aching, discomfort, instability, sharp, shooting, stiffness  What diagnostic workup have you had for this condition?: X-ray  What treatments have you tried for this condition?: corticosteroids, ice, NSAIDs  Progression since onset: : gradually worsening  Is this a work related condition? : No  Fever: No  Chills: No  Numbness: No  Tingling: No    Activities: golf, walking.    Employment: Pt is now retired  S/p his wife's death    Patient's medications, allergies, and surgical histories: Per patient questionnaire, reviewed and confirmed.  Past medical history: Per patient questionnaire, reviewed and confirmed.  Patient did not complete.  Social history: Per patient questionnaire, reviewed and confirmed.   reports that he has never smoked. He has never used smokeless tobacco..  Family history: Per patient questionnaire, reviewed and confirmed.  Patient did not complete.    Evaluation to date: I personally reviewed patient's X-ray which shows moderate-advanced joint space loss with subchondral cystic change and osteophyte formation.  A radiologist report is available for review.     ROS:  Review of systems:   Per patient questionnaire.  Details include: negative.  Denies fever, chills, rash, night sweats, unintentional weight loss.  All other systems negative.       OBJECTIVE:   Vital signs:    Vitals:    05/19/19 0939   BP: 118/62   Pulse: 57   Weight: 68 kg (150 lb)   Height: 1.829 m (6')     General: comfortable male  Mental Status:  Alert and oriented x3, pleasant and cooperative with exam  Extremities: All 4 extremities without edema, clubbing or cyanosis  Gait: Non-antalgic, able to toe walk, heel walk without difficulty.     Spine: No swelling, erythema, ecchymosis or step-off deformity.  Patient is nontender to palpation midline or over paravertebral soft tissue.  Nontender to palpation over SI joints bilaterally.  Full active range of motion, increased pain with right axial rotation.  Lower extremity strength is 5/5, symmetric compared to contralateral leg.  Neurovascularly intact. (-) SLR bilaterally.    Hip and Pelvis:  No swelling, erythema, ecchymosis or deformity. No obvious effusion, no warmth. TTP: non-TTP. There is some diminished range with pain.  Neurovascularly intact.  Full strength with hip flexion, extension, abduction and adduction without pain.     Patrick's / FABER test: positive.     FADIR test: positive.    Hip scouring: positive.       ASSESSMENT & PLAN    1.  Primary osteoarthritis of right hip  Large Joint Aspiration/Injection Procedure: R hip joint    betamethasone acetate & sodium phosphate (CELESTONE) injection 12 mg    lidocaine 2 % injection 5 mL    lidocaine 2 % injection 1 mL    lidocaine 2 % injection 2 mL   2. Primary osteoarthritis of right knee  AMB REFERRAL TO ORTHOPEDIC SURGERY       58 y.o. male with moderate-advanced R hip OA.  His symptoms are progressively worsening    Plan:   -Discussed PT, patient defers  -Reviewed medication options prn for pain   -Encouraged activity as tolerated  -Offered CSI as this helped in the past, patient agrees - see procedure note  -Reviewed  referral for arthroplasty - pt would be good candidate but he is a bit younger than ideal  - he would like to speak with surgeon to get an idea of time frame  -f/u 2-3 months PRN     Questions solicited and answered.     Precautions reviewed.  Medication changes, refills reviewed including directions, side effects and monitoring

## 2019-05-19 NOTE — Procedures (Signed)
Large Joint Aspiration/Injection Procedure: R hip joint    Date/Time: 05/19/2019  9:15 AM EST  Consent given by: patient  Site marked: site marked  Timeout: Immediately prior to procedure a time out was called to verify the correct patient, procedure, equipment, support staff and site/side marked as required     Procedure Details    Location: hip - R hip joint  Preparation: The site was prepped using the usual aseptic technique.  Ultrasound guidance:  Ultrasound was utilized to improve needle visualization, injection accuracy, and anatomic localization.    Anesthetics administered: 5 mL lidocaine 2 %; 1 mL lidocaine 2 %; 2 mL lidocaine 2 %  Intra-Articular Steroids administered: 12 mg betamethasone acetate & sodium phosphate 6 (3-3) MG/ML  Dressing:  A dry, sterile dressing was applied.  Patient tolerance: patient tolerated the procedure well with no immediate complications

## 2019-05-27 ENCOUNTER — Ambulatory Visit
Admission: RE | Admit: 2019-05-27 | Discharge: 2019-05-27 | Disposition: A | Discharge status: Home / Self Care | Payer: BLUE CROSS/BLUE SHIELD | Source: Ambulatory Visit | Attending: Orthopedic Surgery | Admitting: Orthopedic Surgery

## 2019-05-27 ENCOUNTER — Other Ambulatory Visit
Admission: RE | Admit: 2019-05-27 | Discharge: 2019-05-27 | Disposition: A | Discharge status: Home / Self Care | Payer: BLUE CROSS/BLUE SHIELD | Source: Ambulatory Visit | Attending: Primary Care | Admitting: Primary Care

## 2019-05-27 ENCOUNTER — Ambulatory Visit: Payer: BLUE CROSS/BLUE SHIELD | Admitting: Orthopedic Surgery

## 2019-05-27 VITALS — BP 133/64 | Ht 72.0 in | Wt 152.0 lb

## 2019-05-27 DIAGNOSIS — M1611 Unilateral primary osteoarthritis, right hip: Secondary | ICD-10-CM

## 2019-05-27 DIAGNOSIS — C61 Malignant neoplasm of prostate: Secondary | ICD-10-CM | POA: Insufficient documentation

## 2019-05-27 DIAGNOSIS — Z8546 Personal history of malignant neoplasm of prostate: Secondary | ICD-10-CM

## 2019-05-27 DIAGNOSIS — Z0000 Encounter for general adult medical examination without abnormal findings: Secondary | ICD-10-CM | POA: Insufficient documentation

## 2019-05-27 LAB — TESTOSTERONE: Testosterone: 736 ng/dL (ref 193–740)

## 2019-05-27 LAB — COMPREHENSIVE METABOLIC PANEL
ALT: 16 U/L (ref 0–50)
AST: 15 U/L (ref 0–50)
Albumin: 4.5 g/dL (ref 3.5–5.2)
Alk Phos: 57 U/L (ref 40–130)
Anion Gap: 10 (ref 7–16)
Bilirubin,Total: 0.5 mg/dL (ref 0.0–1.2)
CO2: 27 mmol/L (ref 20–28)
Calcium: 9.5 mg/dL (ref 8.6–10.2)
Chloride: 104 mmol/L (ref 96–108)
Creatinine: 1.1 mg/dL (ref 0.67–1.17)
GFR,Black: 85 *
GFR,Caucasian: 73 *
Glucose: 81 mg/dL (ref 60–99)
Lab: 24 mg/dL — ABNORMAL HIGH (ref 6–20)
Potassium: 4.2 mmol/L (ref 3.3–5.1)
Sodium: 141 mmol/L (ref 133–145)
Total Protein: 7 g/dL (ref 6.3–7.7)

## 2019-05-27 LAB — CBC AND DIFFERENTIAL
Baso # K/uL: 0.1 10*3/uL (ref 0.0–0.1)
Basophil %: 0.9 %
Eos # K/uL: 0.2 10*3/uL (ref 0.0–0.5)
Eosinophil %: 4 %
Hematocrit: 43 % (ref 40–51)
Hemoglobin: 13.8 g/dL (ref 13.7–17.5)
IMM Granulocytes #: 0 10*3/uL (ref 0.0–0.0)
IMM Granulocytes: 0.2 %
Lymph # K/uL: 1.7 10*3/uL (ref 1.3–3.6)
Lymphocyte %: 30.4 %
MCH: 29 pg/cell (ref 26–32)
MCHC: 32 g/dL (ref 32–37)
MCV: 90 fL (ref 79–92)
Mono # K/uL: 0.6 10*3/uL (ref 0.3–0.8)
Monocyte %: 10 %
Neut # K/uL: 3.1 10*3/uL (ref 1.8–5.4)
Nucl RBC # K/uL: 0 10*3/uL (ref 0.0–0.0)
Nucl RBC %: 0 /100 WBC (ref 0.0–0.2)
Platelets: 177 10*3/uL (ref 150–330)
RBC: 4.8 MIL/uL (ref 4.6–6.1)
RDW: 12.5 % (ref 11.6–14.4)
Seg Neut %: 54.5 %
WBC: 5.7 10*3/uL (ref 4.2–9.1)

## 2019-05-27 LAB — TSH: TSH: 3.06 u[IU]/mL (ref 0.27–4.20)

## 2019-05-27 LAB — VITAMIN D: 25-OH Vit Total: 31 ng/mL (ref 30–60)

## 2019-05-27 LAB — LIPID PANEL
Chol/HDL Ratio: 2.6
Cholesterol: 179 mg/dL
HDL: 69 mg/dL — ABNORMAL HIGH (ref 40–60)
LDL Calculated: 95 mg/dL
Non HDL Cholesterol: 110 mg/dL
Triglycerides: 74 mg/dL

## 2019-05-27 LAB — MULTIPLE ORDERING DOCS

## 2019-05-27 LAB — PSA (EFF.4-2010): PSA (eff. 4-2010): 0.57 ng/mL (ref 0.00–4.00)

## 2019-05-27 LAB — HEMOGLOBIN A1C: Hemoglobin A1C: 5.4 %

## 2019-05-27 NOTE — Progress Notes (Signed)
Chief Complaint: Right hip pain    History of Present Illness:  Timothy Cross is a 58 y.o. male who presents to my clinic with a chief complaint of right groin pain. The patient indicates his groin pain has been ongoing and progressive for the last several months. The patient characterizes his pain as dull, continuous and rates his pain as a moderate to severe/10. Further, the the patient's groin pain is exacerbated by activities of daily living  including prolonged ambulation, ascending and descending stairs, as well as getting into and out of a vehicle, and relieved with rest.  The patient denies recent or remote history of hip trauma, pediatric hip pathology, radicular type pain, and safety issues (i.e. falls). Assocaited complaints include left knee pain. Prior conservative therapy is outlined below.    Listing & description of previous non surgical treatment:  Activity modification: Yes  Physical Therapy: Yes, therapy refused secondary to exacerbation of symptoms  Trial of medications: Yes,  Weight loss: No  Assisted ambulatory device: No  Intra-articular injection: Yes, repeat injection 1 week ago.  Bracing, orthotic devises: No, not indicated for this patient    Contraindications to above treatment: none    Past medical history, past surgical history, family history, social history, & allergies reviewed and updated as necessary.    Past Medical History:   Diagnosis Date    Anxiety     Arthritis     hip    Cancer     Colon polyp        Past Surgical History:   Procedure Laterality Date    HERNIA REPAIR      INGUINAL HERNIA REPAIR      KNEE SURGERY Bilateral     arthroscopy ACL reconstruction    PR LAP, VENTRAL HERNIA REPAIR,REDUCIBLE N/A 10/29/2016    Procedure:  UMBILICAL HERNIA REPAIR;  Surgeon: Suszanne Finch, MD;  Location: [facility];  Service: General    PR [place] Bilateral 10/29/2016    Procedure:  LAPAROSCOPIC BILATERAL  INGUINAL HERNIA REPAIR with mesh;  Surgeon:  Suszanne Finch, MD;  Location: [facility];  Service: General    PR [place] Left 01/29/2019    Procedure: LAPAROSCOPIC REPAIR WITH MESH, HERNIA, LEFT INGUINAL, ROBOT-ASSISTED;  Surgeon: Suszanne Finch, MD;  Location: [facility];  Service: General    UMBILICAL [place]      Surgery Of Male Genitalia Vasectomy Conversion Data        Family History   Problem Relation Age of Onset    Conversion Other         20080701^Large Intestine Cancer^V16.0^Active^    Cancer Mother     Cancer Brother        Social History: The patient denies smoking alcohol & illicit drug use.      Medications:   Current Outpatient Medications:     diclofenac (VOLTAREN) 1 % gel, Apply 4 g to affected area 4 times daily., Disp: 1 Tube, Rfl: 2    gabapentin (NEURONTIN) 300 MG capsule, Take 1 capsule (300 mg total) by mouth 3 times daily, Disp: 90 capsule, Rfl: 0    tadalafil (CIALIS) 20 MG tablet, take 0.5-1 tablets (10-20 MILLIGRAM total) by mouth daily as needed for erectile dysfunction take at least 30 minutes prior to sexual activity., Disp: 30 tablet, Rfl: 0    naproxen sodium (ANAPROX) 220 MG tablet, Take 220 mg by mouth 2 times daily (  with meals), Disp: , Rfl:     ibuprofen (ADVIL,MOTRIN) 800 MG tablet, Take 800 mg by mouth 3 times daily as needed for Pain, Disp: , Rfl:     acetaminophen (TYLENOL) 325 MG tablet, Take 650 mg by mouth every 6 hours as needed for Pain, Disp: , Rfl:     Allergies   Allergen Reactions    Environmental Allergies Other (See Comments)     sneezing       Review of Systems: A 12 category review of systems was reviewed with the patient, he denies chest pain, shortness of breath, fever/chills, recent weight gain or weight loss, extremity paresthesias, radicular pain, easy bruisability, all systems remaining systems are negative.    Physical Exam:  Vitals were obtained by office technician and later reviewed by myself:  There were no vitals taken for this  visit.  There is no height or weight on file to calculate BMI.  Constitutional: No acute distress, appears stated age.  Psychiatric: Normal affect.  Respiratory: Non-labored breathing.  Skin: No rashes or erythema of exposed skin surfaces.   Neurologic: Sensation intact to light touch in all lumbar and sacral dermatomes of the right lower extremity.  Vascular: right lower extremity compartments soft & compressible.    Musculoskeletal: A complete musculoskeletal exam of the right lower extremity was performed, details include:   Gait: Antalgic gait - None, trendelenburg gait - None   Inspection: Intact soft tissue envelope overlying the right hip, ASIS, and Iliac crest.  Palpation: posterior greater trocahanter tenderness - Mild  Alignment: Neutral limb axis, hip contracture: None  ROM right hip: flexion to 120 degrees,  Internal rotation 10 degrees, external rotation 20 degrees  Right hip effusion: Not appreciable clinically  Right hip crepitus: None  Right knee: Well preserved ROM, stable to varus and valgus stress at 30 of flexion.  Muscle strength: 5/5 hip flexion  Special testing: Negative straight leg raise    Radiographs/Imaging: AP pelvis groin lateral of right hip were independently interpreted by myself. These radiographs demonstrate severe narrowing of the femoral acetabular joint, no acetabular dysplasia, and decreased femoral head-neck offset.     Impression:  1.  Right hip OA -  Severe  2.  Medical comorbidities including:     Plan: I had an extensive discussion with the patient in the office today. We discussed his radiographs and the natural history of his condition.  Treatment options were discussed including alternatives to surgery.  Specifically we discussed physical therapy, anti-inflammatory medication, assisted ambulatory devices such as a cane, and weight reduction/limiting weight gain.  The advantages and disadvantages of each treatment option were discussed in detail. We also discussed hip  reconstruction including the rationale for surgery, the technical aspects of the surgery, the anticipated postoperative course, as well as the risks related to total hip arthroplasty.  These risks include but are not limited to infection requiring IV antibiotics, and further surgery. We discussed nerve injury resulting in permanent leg weakness/foot drop, orthosis requirement, and gait impairment. Blood vessel injury requiring further surgery for limb salvage, bleeding requiring transfusion, iatrogenic femur and/or acetbular fracture, hip instability, limb lengthening, wound drainage, incomplete relief of symptoms, component failure, reflex sympathetic dystrophy, risks of anesthesia, risk of blood clots in the legs and/or lungs, post-op ileus, risk of exacerbation of underlying medical issues including heart attack, stroke, respiratory failure, kidney failure, and even death.     Implant selection discussed, questions entertained and answered.    We discussed the presence of  COVID-19 in the community and difficult to quantify risk of exposure in our health care setting actively managing COVID-19+ cases. Reaffirmed that every possible safety measure is being performed to prevent exposure during eventual hospitalization for surgery, however risk of Covid exposure is greater at [facility] than if patient were to remain sheltered at home. The patient is willingly to accept this additional risk. Discussed that current total joint arthroplasty (TJA) surgical booking requires case review by hospital officials during the [place] pandemic.    Right THA Plan:  Medical Optimization: recent injections  Clearances: PCP   ICD 10: M16.11  CPT: 27130  Anesthesia: Regional, Per Protocol  Time: 2 hours  Abx: Ancef pending MRSA screen  Approach: Posterior  Equipment: ZB G7, taperloc   DVT: ASA  Special Considerations:   [facility] Prioritization Score: 9          Rationale for deviation from stepped care approach:  none

## 2019-05-28 ENCOUNTER — Encounter: Payer: Self-pay | Admitting: Primary Care

## 2019-06-02 ENCOUNTER — Ambulatory Visit: Payer: BLUE CROSS/BLUE SHIELD | Admitting: Orthopedic Surgery

## 2019-06-02 ENCOUNTER — Encounter: Payer: Self-pay | Admitting: Orthopedic Surgery

## 2019-06-02 VITALS — BP 131/64 | HR 67 | Ht 72.0 in | Wt 152.0 lb

## 2019-06-02 DIAGNOSIS — M1612 Unilateral primary osteoarthritis, left hip: Secondary | ICD-10-CM

## 2019-06-02 DIAGNOSIS — G8929 Other chronic pain: Secondary | ICD-10-CM

## 2019-06-02 DIAGNOSIS — M25562 Pain in left knee: Secondary | ICD-10-CM

## 2019-06-02 NOTE — Progress Notes (Signed)
Reason for evaluation: F/u L knee pain    Interval history:  Patient reports no changes in symptoms. Still with pain centered around proximal tib-fib joint with radiation into foot, worse with prolonged standing. 09/13/08. Worse with moving better with ret. Did see Dr. Noni Saupe for bilateral hip arthritis, proposed a diagnostic intra-articular L hip injection to see if this knee pain is referred from hip arthritis. He was on gabapentin x 3 weeks, did not see any improvement so he stopped. Did not have any rebound pain. Voltaren has been working marginally.   ]  Past Medical History:   Diagnosis Date    Anxiety     Arthritis     hip    Cancer     Colon polyp      Past Surgical History:   Procedure Laterality Date    HERNIA REPAIR      INGUINAL HERNIA REPAIR      KNEE SURGERY Bilateral     arthroscopy ACL reconstruction    PR LAP, VENTRAL HERNIA REPAIR,REDUCIBLE N/A 10/29/2016    Procedure:  UMBILICAL HERNIA REPAIR;  Surgeon: Suszanne Finch, MD;  Location: [facility] OR;  Service: General    PR [place] Bilateral 10/29/2016    Procedure:  LAPAROSCOPIC BILATERAL  INGUINAL HERNIA REPAIR with mesh;  Surgeon: Suszanne Finch, MD;  Location: [facility];  Service: General    PR [place] Left 01/29/2019    Procedure: LAPAROSCOPIC REPAIR WITH MESH, HERNIA, LEFT INGUINAL, ROBOT-ASSISTED;  Surgeon: Suszanne Finch, MD;  Location: [place] MA[facility] OR;  Service: General    UMBILICAL [place]      Surgery Of Male Genitalia Vasectomy Conversion Data      \  Family History   Problem Relation Age of Onset    Conversion Other         [id number]^Large Intestine Cancer^V16.0^Active^    Cancer Mother     Cancer Brother      Social History     Socioeconomic History    Marital status: Widowed     Spouse name: Not on file    Number of children: Not on file    Years of education: Not on file    Highest education level: Not on file   Tobacco Use    Smoking status: Never Smoker     Smokeless tobacco: Never Used   Substance and Sexual Activity    Alcohol use: Yes     Types: 1 Glasses of wine, 4 Cans of beer per week     Comment: 3-4 drinks on the weekend    Drug use: No    Sexual activity: Yes     Partners: Female     Birth control/protection: None     Comment: vasectomy   Other Topics Concern    Not on file   Social History Narrative    Not on file       Current Outpatient Medications:     diclofenac (VOLTAREN) 1 % gel, Apply 4 g to affected area 4 times daily., Disp: 1 Tube, Rfl: 2    gabapentin (NEURONTIN) 300 MG capsule, Take 1 capsule (300 mg total) by mouth 3 times daily, Disp: 90 capsule, Rfl: 0    tadalafil (CIALIS) 20 MG tablet, take 0.5-1 tablets (10-20 MILLIGRAM total) by mouth daily as needed for erectile dysfunction take at least 30 minutes prior to sexual activity., Disp: 30 tablet, Rfl: 0    naproxen  sodium (ANAPROX) 220 MG tablet, Take 220 mg by mouth 2 times daily (with meals), Disp: , Rfl:     ibuprofen (ADVIL,MOTRIN) 800 MG tablet, Take 800 mg by mouth 3 times daily as needed for Pain, Disp: , Rfl:     acetaminophen (TYLENOL) 325 MG tablet, Take 650 mg by mouth every 6 hours as needed for Pain, Disp: , Rfl:   Allergies: .personally reviewed by me and documented in erecord.  A full 12 point ROS was reviewed. MSK complaints per HPI. No constitutional sxs. All other systems reviewed with no pertinent positives or negatives.        Physical Exam:   General: Alert, no acute distress, vital in erecord.    HEENT:Normocephalic. nml sclera. No tracheal deviation  Psych: Appropriate mood and affect. Alert and oriented to person, place, time  Skin: no pallor. No erythema. No rashes or lesions on visible skin  CV: + PT pulse. No pitting edema. No vericosities of the extremities.  Pulmonary: nml respiratory effort. No audible wheezing  MSK: no obvious kyphosis or scoliosis. FROM BUE.  Neuro: no tremor. SILT      Lymphatic: No lymphadenopathy at the knee or ankle    MSK:  LLE: skin intact without discoloration. 5/5 strength ankle DF/PF, EHL, great toe flexion, knee flexion/extension. SILT FDWS, medial, lateral, dorsal, plantar foot. 2+ DP.     Results reviewed: AP pelvis showing moderate degenerative changes in the left hip    Assessment: L lateral based knee pain. MRI showing medial compartment arthritis, but his pain is all lateral based. DDx includes referred hip pain, nerve irritation    Encounter Diagnoses   Name Primary?    Chronic pain of left knee Yes       Plan:   Referral for Dr. Rosiland Oz for diagnostic L hip injection  Consider EMG if pain persists  Continue PT exercises    Follow Up: After diagnostic L hip injection    Buddy Duty, MD  Orthopaedic Surgery  06/02/2019, 3:26 PM    I have seen and examined the patient and agree with the above findings, assessment, and plan.  Jerene Pitch, MD  06/09/2019  8:23 AM

## 2019-06-09 ENCOUNTER — Telehealth: Payer: 59 | Admitting: Radiation Oncology

## 2019-06-09 ENCOUNTER — Encounter: Payer: Self-pay | Admitting: Radiation Oncology

## 2019-06-09 ENCOUNTER — Ambulatory Visit: Payer: 59 | Admitting: Radiation Oncology

## 2019-06-10 ENCOUNTER — Other Ambulatory Visit: Payer: Self-pay | Admitting: Radiation Oncology

## 2019-06-10 DIAGNOSIS — C61 Malignant neoplasm of prostate: Secondary | ICD-10-CM

## 2019-06-11 NOTE — Progress Notes (Signed)
Unable to reach patient for follow-up.   Communicated with patient via [place]. His bloodwork is good and he is feeling well.   We will follow-up with him in 1 year.

## 2019-06-30 ENCOUNTER — Encounter: Payer: Self-pay | Admitting: Sports Medicine

## 2019-06-30 ENCOUNTER — Ambulatory Visit: Payer: PRIVATE HEALTH INSURANCE | Admitting: Sports Medicine

## 2019-06-30 VITALS — BP 120/59 | HR 56 | Ht 72.0 in | Wt 150.0 lb

## 2019-06-30 DIAGNOSIS — M1712 Unilateral primary osteoarthritis, left knee: Secondary | ICD-10-CM

## 2019-06-30 MED ORDER — LIDOCAINE HCL 2 % IJ SOLN *I*
2.0000 mL | Freq: Once | INTRAMUSCULAR | Status: AC | PRN
Start: 2019-06-30 — End: 2019-06-30
  Administered 2019-06-30: 11:00:00 2 mL via INTRA_ARTICULAR

## 2019-06-30 MED ORDER — BETAMETHASONE ACET & SOD PHOS 6 (3-3) MG/ML IJ SUSP *I*
12.0000 mg | Freq: Once | INTRAMUSCULAR | Status: AC | PRN
Start: 2019-06-30 — End: 2019-06-30
  Administered 2019-06-30: 12 mg via INTRA_ARTICULAR

## 2019-06-30 NOTE — Procedures (Addendum)
Large Joint Aspiration/Injection Procedure: L knee intra - articular    Date/Time: 06/30/2019 11:15 AM EST  Consent given by: patient  Timeout: Immediately prior to procedure a time out was called to verify the correct patient, procedure, equipment, support staff and site/side marked as required     Procedure Details    Location: knee - L knee intra - articular  Preparation: The site was prepped using the usual aseptic technique.  Anesthetics administered: 2 mL lidocaine 2 %  Intra-Articular Steroids administered: 12 mg betamethasone acetate & sodium phosphate 6 (3-3) MG/ML  Dressing:  A dry, sterile dressing was applied.  Patient tolerance: patient tolerated the procedure well with no immediate complications      Zachery Conch MD  PM&R PGY-2  06/30/19 1:10 PM  Patient seen and discussed with attending Dr. Marjory Lies

## 2019-06-30 NOTE — Progress Notes (Addendum)
Physical Medicine and Rehabilitation Note  Name: Timothy Cross   DOB: 1961/03/04   Date: 06/30/2019   Referring Provider:  No ref. provider found     Chief Complaint: Timothy Cross presents today for new evaluation of left anterior, medial and lateral knee pain.    Subjective   History of Present Illness:    Pain location: Hip(left)  Pain score:   0 - No pain  Pain frequency:    Pain description:       The pain began May 2020 without a specific injury. Gradual onset of pain over time.  Reports this as a burning/aching pain that is located at the lateral aspect of his knee, he reports a distinct and separate pain that started 3 weeks ago in his anterior and medial knee that is associated with swelling.    Improved by rest, voltaren. Worsened with walking. Associated symptoms: swelling, instability and stiffness.     Diagnostics:  XR bilateral knee 03/24/2019 - I personally reviewed the images 06/30/2019 which showed: Moderate bilateral knee osteoarthritis    XR bilateral hip 05/27/2019 - I personally reviewed the images 06/30/2019 which showed: bilateral hip osteoarthritis; moderate to severe on left, severe on right    Laboratory:   Lab Results   Component Value Date    HA1C 5.4 05/27/2019    HA1C 5.3 05/14/2018    CREAT 1.10 05/27/2019    CREAT 1.02 01/21/2019    AST 15 05/27/2019    ALT 16 05/27/2019    ALB 4.5 05/27/2019                 Treatments so far:  Physical therapy: tried for L knee, no relief  Brace/Assistive Device: hinged knee brace, good relief  Acetaminophen: some relief  NSAIDs: ibuprofen, good relief   Other: good relief with Voltaren gel, gabapentin was not helpful  Corticosteroid injections: not tried for knee  Viscosupplementation: not tried  Radiofrequency ablation: not tried  Surgery: not tried       Objective     Physical Exam:      Right Knee:    Inspection:   Swelling: mild. Erythema: no. Ecchymosis: no.   Alignment: neutral    Palpation:   Warmth: normal    Tenderness: medial joint  line  Effusion: trace  Crepitus: mild    Range of Motion:  Extension: 0  Flexion: 120, with pain      Special Tests:  Varus stress: normal  Valgus stress: normal  Lachman: negative  Posterior drawer: negative  Patellar grind: Negative    Patellar apprehension: Negative          Left Knee:     Inspection:  Swelling: mild. Erythema: No. Ecchymosis: No.   Alignment: neutral.    Palpation:  Warmth: normal    Tenderness:  Medial joint line  Effusion: trace    Crepitus: mild.     Range of Motion:   Extension: 10, without pain    Flexion: 120, with pain      Special Tests:   Varus stress: normal  Valgus stress: normal  Lachman: normal  Posterior drawer: normal  Patellar grind: Negative    Patellar apprehension: Negative          Gait   normal          Vital signs: BP 120/59    Pulse 56    Ht 1.829 m (6')    Wt 68 kg (150 lb)    BMI 20.34 kg/m  Assessment:  1. Osteoarthritis of left knee, unspecified osteoarthritis type         His presenting problem is chronic, with progression, with exacerbation. While he was referred for potential diagnostic hip injection for concern of referred hip pain from left hip OA, his exam findings are not consistent with hip OA, however, they are consistent with knee OA. Will determine if patient receives benefit from intra-articular left knee injection.      Plan:  Orders Placed This Encounter   Procedures    Large Joint Aspiration/Injection Procedure: L knee intra - articular       - Discussed the anatomy and pathophysiology of knee osteoarthritis.  - Discussed judicious use of Tylenol.  - Discussed judicious use of over-the-counter NSAIDs.  - Continue Voltaren gel up to 4 times/day  - Left landmark guided knee injection today (see procedure note)  - Return for follow-up as needed  - If pain does not resolve may consider hip injection, however given provacative testing of the hip did not recreate any pain at the hip or knee felt this was likely not generator for patient's  pain      Zachery Conch MD  PM&R PGY-2  06/30/19 4:03 PM  Patient seen and discussed with attending Dr. Diona Foley:    I saw and evaluated the patient. I have reviewed and edited the resident's/fellow's note and confirm the findings and plan of care as documented above.  I was present for procedure.    Edman Circle, MD, [place]  Sports Medicine  Attending Physician

## 2019-07-08 ENCOUNTER — Telehealth: Payer: Self-pay

## 2019-07-08 ENCOUNTER — Encounter: Payer: Self-pay | Admitting: Orthopedic Surgery

## 2019-07-08 NOTE — Telephone Encounter (Signed)
Ortho Nurse Navigator Initial Intake Assessment Interview  Surgeon Plan   Name  Dr. Noni Saupe   Procedure RTHA    March    scheduled  Yes     sent to scheduler  Yes    M16.11, 27130, 2 hours, posterior approach, ZB G7 taperloc, regional anes.       TJR Classification [facility]     primary joint replacement.                       Class classification  2 semi urgent                                                                                  Planned resource utilization short stay 2  Covid status negative 3  Discharge dispo  home with ancillary services 2  Total 9      Met with pt  via phone interview     yes                      [facility] book to be sent via scheduler after surgical date confirmed by scheduler     Must have support person for d/c  to home   yes           Sister in-law  D/C PLAN to Home YES                       equipment :   cane walker commode   if SDS must obtain equip before hand   Home Situation  1   Story,   # 3  Steps to get in home with rail   Yes      Plan to set up first/main floor set up with recliner, sofa or bed N/A      With bathroom  N/A  first floor  PCP must be seen before surgery   Specialist to see prior to surgery Other - none       Pre op anticoagulant therapy-medication                  None                                                                BMI 20.34    DM   No   meds          recent A1C           or blood sugars        PT EDUCATION pre,op peri op, post op Total Joint Replacement       sent to patient via mychart   yes            or email   Yes                       mailed via post  officeNo   ON LINE video  LINK   [email or web address]    COVID 19 [place] IN REGARDS TO ELECTIVE SURGERY AND HIGH HOSPITAL CENSUS  WE ARE GIVING OUT  DATES FOR NEW ORTHO SURGERIES AS  WE ARE ALLOWED BY [place]    PLEASE BE AWARE THAT YOUR SURGERY MAY HAVE TO BE POSTPONED AND  YOUR NAME PLACED IN A CUE FOR RESCHEDULING .  IF YOU ALREADY HAVE A DATE WHILE YOU ARE WAITING , PLEASE BE SEEN BY ANY SPECIALISTS for surgical clearance and any recommendations from these doctors IE [place].     If your Doctor is part of the [facility] erecord system please have them place a note in the chart.      IF outside of [facility] -have clearance letters faxed to [phone]     ?    Ortho Scheduler will call you to give you a surgical date when we are booking  for your surgeon    pt will set up Primary care MD appt for at least 30 days before surgery date    Pre Op staff to call pt  for [facility] on site visit.21-30 days before surgery date     ?  Covid 19 Pre op staff to call you 7 days before surgery date to notify you for COVID 19 nasal testing. Has to be done 5 days before OR date and they will give you information for location of testing date and time.  ?  Pre op  to call you prior to day of surgery between 130pm and 5pm for your OR time.  Plan on being here 2 1/2 hour prior to surgery time  ?  Please isolate in your home and within your bubble- 2 weeks prior to surgery. If working consider the environment that you are in and is it high risk for being exposed to people with Covid 19. Practice social distancing, wearing masks and hand washing to prevent.acquiring Covid.    Please restrict travel out of the area month prior to surgery   Flu vaccine please get at least 2 weeks prior to surgery or follow up to get 2 weeks post op.   Covid vaccine please plan for 5 weeks before surgical date-  and or wait until after OR.   ?  COVID 19 UPDATES   You will receive a mask and have your temp checked-on entering hospital please wear it when staff enter your room or you are out in hallway.   ?  Visitation-HH  At this time there are no visitors allowed in to [facility].  Your support person will drop you off prior to surgery and come pick you up when you are informed you have been cleared for discharge. Staff will  go over discharge instructions with you both via ipad.   Surgeon or his team will call your support person when you are out of the operating room and in recovery. You may be in recovery or PACU 30 min to 2-3 hours depending on how well you are doing postop. The PACU will take you to the nursing unti and staff will cue to use in patient phone to call to your support person to update then you are on the unit.          If further questions please  call me with any questions pre op.     Merwyn Katos, RN    Nurse Navigator, [facility]

## 2019-07-10 ENCOUNTER — Encounter: Payer: Self-pay | Admitting: Primary Care

## 2019-07-10 ENCOUNTER — Encounter: Payer: Self-pay | Admitting: Gastroenterology

## 2019-07-10 ENCOUNTER — Encounter: Payer: Self-pay | Admitting: Orthopedic Surgery

## 2019-07-16 ENCOUNTER — Encounter: Payer: Self-pay | Admitting: Gastroenterology

## 2019-07-16 LAB — HM COLONOSCOPY

## 2019-07-20 ENCOUNTER — Ambulatory Visit: Payer: BLUE CROSS/BLUE SHIELD | Admitting: Orthopedic Surgery

## 2019-07-22 ENCOUNTER — Telehealth: Payer: Self-pay | Admitting: Primary Care

## 2019-07-22 NOTE — Telephone Encounter (Signed)
-----   Message from Lonie Peak, MD sent at 07/10/2019 11:52 AM EST -----  Regarding: Medical Clearance  Please send medical clearance letter and last EKG to Ortho. Thanks. KW  ----- Message -----  From: Binnie Rail  Sent: 07/10/2019  11:48 AM EST  To: Lonie Peak, MD

## 2019-07-22 NOTE — Telephone Encounter (Signed)
Faxed to [phone].

## 2019-07-27 ENCOUNTER — Encounter: Payer: Self-pay | Admitting: Orthopedic Surgery

## 2019-07-28 ENCOUNTER — Ambulatory Visit: Payer: BLUE CROSS/BLUE SHIELD | Admitting: Orthopedic Surgery

## 2019-08-21 ENCOUNTER — Other Ambulatory Visit: Payer: Self-pay | Admitting: Orthopedic Surgery

## 2019-08-21 DIAGNOSIS — Z01818 Encounter for other preprocedural examination: Secondary | ICD-10-CM

## 2019-08-25 ENCOUNTER — Ambulatory Visit
Admission: RE | Admit: 2019-08-25 | Discharge: 2019-08-25 | Disposition: A | Discharge status: Home / Self Care | Payer: 59 | Source: Ambulatory Visit | Attending: Orthopedic Surgery | Admitting: Orthopedic Surgery

## 2019-08-25 DIAGNOSIS — M1611 Unilateral primary osteoarthritis, right hip: Secondary | ICD-10-CM

## 2019-08-25 DIAGNOSIS — Z01818 Encounter for other preprocedural examination: Secondary | ICD-10-CM | POA: Insufficient documentation

## 2019-08-25 HISTORY — DX: Unilateral primary osteoarthritis, right hip: M16.11

## 2019-08-25 LAB — COMPREHENSIVE METABOLIC PANEL
ALT: 13 U/L (ref 0–50)
AST: 20 U/L (ref 0–50)
Albumin: 4.5 g/dL (ref 3.5–5.2)
Alk Phos: 61 U/L (ref 40–130)
Anion Gap: 8 (ref 7–16)
Bilirubin,Total: 0.4 mg/dL (ref 0.0–1.2)
CO2: 30 mmol/L — ABNORMAL HIGH (ref 20–28)
Calcium: 9.7 mg/dL (ref 8.6–10.2)
Chloride: 103 mmol/L (ref 96–108)
Creatinine: 1.11 mg/dL (ref 0.67–1.17)
GFR,Black: 84 *
GFR,Caucasian: 73 *
Glucose: 80 mg/dL (ref 60–99)
Lab: 21 mg/dL — ABNORMAL HIGH (ref 6–20)
Potassium: 4.3 mmol/L (ref 3.3–5.1)
Sodium: 141 mmol/L (ref 133–145)
Total Protein: 7 g/dL (ref 6.3–7.7)

## 2019-08-25 LAB — CBC
Hematocrit: 44 % (ref 40–51)
Hemoglobin: 14 g/dL (ref 13.7–17.5)
MCH: 29 pg (ref 26–32)
MCHC: 32 g/dL (ref 32–37)
MCV: 89 fL (ref 79–92)
Platelets: 174 10*3/uL (ref 150–330)
RBC: 4.9 MIL/uL (ref 4.6–6.1)
RDW: 12.6 % (ref 11.6–14.4)
WBC: 5.5 10*3/uL (ref 4.2–9.1)

## 2019-08-25 LAB — PROTIME-INR
INR: 1 (ref 0.9–1.1)
Protime: 11.5 s (ref 10.0–12.9)

## 2019-08-25 LAB — TYPE AND SCREEN
ABO RH Blood Type: B POS
Antibody Screen: NEGATIVE

## 2019-08-25 LAB — ORTHOPEDICS SA NASAL COMPLETE PCR: Orthopedics SA nasal complete PCR: 0

## 2019-08-25 LAB — HEMOGLOBIN A1C: Hemoglobin A1C: 5.3 %

## 2019-08-25 LAB — ORTHOPEDICS MRSA NASAL COMPLETE PCR: Orthopedics MRSA nasal complete PCR: 0

## 2019-08-25 LAB — APTT: aPTT: 29.2 s (ref 25.8–37.9)

## 2019-08-25 NOTE — Anesthesia Preprocedure Evaluation (Addendum)
Anesthesia Pre-operative History and Physical for Timothy Cross  History and Physical Performed at [place] ([place])  Highlighted Issues for this Procedure:  59 yo male with right hip OA/DJD presents for right THA, posterior approach    Electrophysiology/AICD/Pacer:  10 Apr 2019 -- NSR @ 59 bpm, no pathologic QRS/ST/T changes, no significant change from baseline    .  By Leeann Must, PA at 8:33 AM on 08/25/2019    Anesthesia Evaluation Information Source: patient, records, family     ANESTHESIA HISTORY     Denies anesthesia history  Pertinent(-):  No History of anesthetic complications or Family hx of anesthetic complications    GENERAL     Denies general issues  Pertinent (-):  No obesity, substance abuse, history of anesthetic complications or Family Hx of Anesthetic Complications    HEENT     Denies HEENT issues  Pertinent (-):  No neck pain PULMONARY     Denies pulmonary issues  Pertinent(-):  No smoking, asthma, recent URI, snoring or sleep apnea    CARDIOVASCULAR     Denies cardiovascular issues  Good(4+METs) Exercise Tolerance  Pertinent(-):  No hypertension, past MI, angina, valvular heart disease, dysrhythmias, CHF or hx of DVT    Comment: Activity limited on by hip pain    GI/HEPATIC/RENAL    NPO Status  NPO    > 8hrs ago (solids) and > 2hrs ago (clears)    + Alcohol use          social    + Urinary Issues (prostate cancer s/p radiation)  Pertinent(-):  No GERD, PUD, liver  issues or renal issues  NEURO/PSYCH     Denies neuro/psych issues  Pertinent(-):  No dizziness/motion sickness, syncope, seizures or cerebrovascular event    ENDO/OTHER     Denies endo issues  Pertinent(-):  No diabetes mellitus, thyroid disease    HEMATOLOGIC    + Blood dyscrasia          hyperlipidemia    + Arthritis          hips  Pertinent(-):  No bruising/bleeding easily         Physical Exam    Airway            Mouth opening: normal            Mallampati: I            TM distance (fb): >3 FB            Neck ROM: full             Airway Impression: easy  Dental   Normal Exam   Cardiovascular  Normal Exam           Rhythm: regular           Rate: normal  No murmur      Neurologic    Normal Exam    General Survey    Normal Exam   Pulmonary   Normal Exam    breath sounds clear to auscultation    No wheezes    Mental Status   Normal Exam    oriented to person, place and time       ________________________________________________________________________  PLAN  ASA Score  1  Anesthetic Plan general    Protocols/Care Pathways (Total Joint Arthroplasty) Induction (routine IV) General Anesthesia/Sedation Maintenance Plan (inhaled agents, propofol infusion and IV bolus); Airway (LMA); Line ( use current access); Monitoring (standard ASA); Positioning (lateral decubitus and  arms out); PONV Plan (dexamethasone, haloperidol, propofol infusion, no N2O and no muscle relaxants); Pain (per surgical team and intraop local- bupiv SAB per protocol); PostOp (PACU)    Informed Consent     Risks:          Risks discussed were commensurate with the plan listed above with the following specific points: N/V, aspiration and sore throat, Damage to: eyes, teeth, blood vessels and nerves, allergic Rx, unexpected serious injury, awareness and death, HA, BA, hypotension, high or inadequate block, nerve damage, bleeding, infection.    Anesthetic Consent:         Anesthetic plan (and risks as noted above) were discussed with patient and sibling    Blood products Consent:        Use of blood products discussed with: sibling and they consented    Responsible Anesthesia Attestation:  I attest that the patient or proxy understands and accepts the risks and benefits of the anesthesia plan. I also attest that I have personally performed a pre-anesthetic examination and evaluation, and prescribed the anesthetic plan for this particular location within 48 hours prior to the anesthetic as documented. Simonne Come, MD  09/24/19, 9:49 AM

## 2019-08-25 NOTE — Discharge Instructions (Signed)
Pre-Operative Instructions - Total Joint Replacement                FOLLOW YOUR SURGEON'S INSTRUCTIONS IF DIFFERENT THAN BELOW.                   PRIOR TO SURGERY    Five days before surgery, please STOP taking if surgeon does not specify:    · Anti-inflammatory medications (Ibuprofen, Motrin, Advil, Mobic, Meloxicam, Aleve, Naproxen, Voltaren, etc.)  · Vitamins and herbal supplements, including herbal teas     · YOU MAY TAKE ACETAMINOPHEN (TYLENOL) as needed    · Directions regarding your prescribed blood thinners, including aspirin, must be approved by your cardiologist or prescribing doctor.      THREE NIGHTS BEFORE SURGERY FOR INFECTION CONTROL    • We ask you to shower for 3 evenings before your surgery using the 4% Chlorhexidine soap the nurse has given you.     • Each night take a shower using your normal soap and shampoo.    • Afterward, while still in the shower, pour approximately 1/3 of the 4% Chlorhexidine soap on a washcloth and wash your body from the neck down. DO NOT wash your head, face, eyes and ears with the Chlorhexidine soap. This soap does not lather. Let the soap sit on your skin for 2 minutes.  Rinse thoroughly.     • Dry off with a clean, fresh towel each evening.    • Do not apply lotion after your showers. Do not shave below your waist for one week before your surgery.    • If you experience itching or redness on application, wash it off and do not use it again. Continue with an over-the-counter antibacterial soap (such as Dial).    • After showering, dress in freshly laundered clothes each evening and sleep on clean bedsheets.    ARRIVAL/SURGICAL TIME    · Staff from the [facility] will call you between 130PM and 4PM on the day before surgery to inform you of your arrival and surgery times. The call will be on Friday if your surgery is on Monday.    DAY BEFORE  SURGERY    · Keep yourself well hydrated to aid in the placement of your IV and for your general well-being.  · Do not eat anything after midnight the night before your surgery (including candy or gum).                                                                                         DAY OF SURGERY    · DO NOT CONSUME FOOD OF ANY KIND.    · Only Gatorade, clear apple juice or water from midnight until 2 hours before your scheduled surgery.    · DO NOT WEAR: BODY LOTION OR SCENTS.      · Please understand that rings and body piercings need to be removed and left at home. If they are not removed, your surgery is at risk of being delayed or cancelled.     · When you come to [facility], please try not bring any valuable items with   the exception of your phone to keep you in contact with your family members.     ·  Valuables such as jewelry, cash and credit cards are best left at home during your stay.  Please send them home with family members.  If you are alone a staff member will assist you in storing your belongings.    · We ask that your cell phone be with you and turned on, so that the Preop and OR RNs may phone you directly with instructions on the morning of surgery if necessary.    · [facility] does not assume responsibility for items brought with you on the day of surgery.    · If wearing eyeglasses, please bring a case.  DO NOT WEAR CONTACT LENSES.    · You may brush your teeth, shower (with Chlorhexidine soap) and use deodorant.    · Patient visitation is restricted at this time due to COVID-19 concerns.     SURGICAL SITE INFECTION MEASURES PERFORMED ON THE DAY OF SURGERY:       Neck to toes Chlorhexidine bath-6 prepackaged wipes applied to the skin surface   Nasal Antiseptic-4 swabs used to clean nostrils    Chlorhexidine mouth rinse for 30 seconds      MEDICATIONS: DAY OF SURGERY    · Take your medications as directed according to your printed, verbal or MyChart instructions.    · Anxiety and pain  medications may be taken as prescribed at any time prior to arrival.   · Medications from the [facility] will be administered to you under the direction of your surgeon. Please leave your prescriptions at home with the exception of your inhalers.    AT THE HOSPITAL ON THE DAY OF SURGERY    · Park in the Main Ramp garage.  Enter the building through the Main Lobby.     · Please stop at the Information Desk for directions to the Surgery Center on [address].      [facility]    As a convenience, prior to your discharge we will fill any discharge medications you require.      The pharmacy is open from 9am to 5:30pm on weekdays and 10am-2pm on Saturdays.     If you will be alone on the day of surgery and want to leave with your prescribed medication, you may:  · Bring a check made out to [facility]  · Use a credit card to pay for your prescription  · Have your family call the pharmacy with a credit card number  · Only bring cash to the hospital as a last resort. Prescriptions cannot be filled without payment. Thank you for your consideration.    QUESTIONS?    · Question about these instructions? Call [phone], select option 2, and leave a message at any time.   A nurse will return your call during our regular business hours.  · Any questions regarding specifics about your surgery or recovery? Please call your surgeon’s office.

## 2019-09-02 ENCOUNTER — Encounter: Payer: Self-pay | Admitting: Primary Care

## 2019-09-02 ENCOUNTER — Other Ambulatory Visit: Payer: Self-pay | Admitting: Pulmonary Disease

## 2019-09-02 DIAGNOSIS — Z23 Encounter for immunization: Secondary | ICD-10-CM

## 2019-09-10 ENCOUNTER — Encounter: Payer: Self-pay | Admitting: Orthopedic Surgery

## 2019-09-11 ENCOUNTER — Ambulatory Visit: Payer: 59 | Attending: Internal Medicine

## 2019-09-11 DIAGNOSIS — Z23 Encounter for immunization: Secondary | ICD-10-CM

## 2019-09-11 NOTE — Progress Notes (Signed)
   Covid-19 Vaccination Clinic  Name:  Jeremiah Weaver    MRN: [id number] DOB: 1961/05/27  09/11/2019  Mr. [name] was observed post Covid-19 immunization for 15 minutes without incident. He was provided with Vaccine Information Sheet and instruction to access the V-Safe system.   Mr. [name] was instructed to call 911 with any severe reactions post vaccine: Marland Kitchen Difficulty breathing  . Swelling of face and throat  . A fast heartbeat  . A bad rash all over body  . Dizziness and weakness   Immunizations Administered    Name Date Dose VIS Date Route   Pfizer COVID-19 Vaccine 09/11/2019 11:39 AM 0.3 mL 05/22/2019 Intramuscular   Manufacturer: [place]   Lot: [id number]   [place]: [id number]

## 2019-09-19 ENCOUNTER — Ambulatory Visit: Payer: 59 | Attending: Orthopedic Surgery

## 2019-09-19 DIAGNOSIS — Z20822 Contact with and (suspected) exposure to covid-19: Secondary | ICD-10-CM | POA: Insufficient documentation

## 2019-09-19 DIAGNOSIS — Z01818 Encounter for other preprocedural examination: Secondary | ICD-10-CM | POA: Insufficient documentation

## 2019-09-19 DIAGNOSIS — Z01812 Encounter for preprocedural laboratory examination: Secondary | ICD-10-CM | POA: Insufficient documentation

## 2019-09-19 DIAGNOSIS — Z20828 Contact with and (suspected) exposure to other viral communicable diseases: Secondary | ICD-10-CM | POA: Insufficient documentation

## 2019-09-19 LAB — COVID-19 NAAT (PCR): COVID-19 NAAT (PCR): NEGATIVE

## 2019-09-19 LAB — COVID-19 PCR

## 2019-09-20 ENCOUNTER — Encounter: Payer: Self-pay | Admitting: Orthopedic Surgery

## 2019-09-22 NOTE — Comprehensive Assessment (Signed)
09/22/19 [place]   Spokesperson Name Redmond Clare   Relationship to Patient  Parent   Phone Number(s) [phone]   Alternate Spokesperson? Yes   Spokesperson Alternate Vicky Lauffer   Relationship to Patient  Other relative  (Sister-in-law)   Emergency Contact other than spokesperson? No   Is spokesperson the patient's caregiver after discharge? Yes   Additional Contacts/Supports None   Discharge transportation   Ride to/from facility [facility]   Relationship to Patient  Other relative        09/22/19 [place]   Marital status Widowed   Ethnicity/Race Caucasian   Primary Language English   Primary Care Taker of? No one   Risk Factors   Risk Factors Adjustment to Dx/Injury/Illness   Living Situation   Lives With Alone   Can they assist patient after discharge? Yes   Home Geography   Type of [place]   # Of Steps In Home 0   # Steps to Enter Home 3   Bedroom First floor   Bathroom First floor - full   Utilitites Working Yes   Psychosocial   Person assessed Patient   Coping Status Well   Baseline ADL functioning   Transfers Independent   Ambulation Independent   Assistive Device none   Bathing/Grooming Independent   Meal Prep Independent   Able to feed self? Yes   Household maintenance/chores Independent   Able to drive? Yes   Income Financial planner Information   ([place])   Prescription Coverage and has   [place]   Current home equipment available [place];Shower chair;Commode  (will plan to get walker through insurance after surgery)   Time Spent   Time Spent with Patient (min) 15     Chart reviewed and spoke with patient by phone to complete the social work assessment.  Patient's SIL Jocelyn Lamer who is a retired Marine scientist will provide transport and stay with him after surgery.  [facility], referral given to [facility].    Sherrilyn Rist, [place]  Pager [phone]  09/22/2019  2:00  PM

## 2019-09-24 ENCOUNTER — Inpatient Hospital Stay: Payer: 59 | Admitting: General Surgery

## 2019-09-24 ENCOUNTER — Inpatient Hospital Stay: Payer: 59

## 2019-09-24 ENCOUNTER — Encounter
Admission: RE | Disposition: A | Discharge status: Home Health | Payer: Self-pay | Source: Ambulatory Visit | Attending: Orthopedic Surgery

## 2019-09-24 ENCOUNTER — Inpatient Hospital Stay
Admission: RE | Admit: 2019-09-24 | Discharge: 2019-09-25 | DRG: 301 | Disposition: A | Discharge status: Home Health | Payer: 59 | Source: Ambulatory Visit | Attending: Orthopedic Surgery | Admitting: Orthopedic Surgery

## 2019-09-24 ENCOUNTER — Inpatient Hospital Stay: Payer: 59 | Admitting: Anesthesiology

## 2019-09-24 DIAGNOSIS — Z96641 Presence of right artificial hip joint: Secondary | ICD-10-CM

## 2019-09-24 DIAGNOSIS — M1611 Unilateral primary osteoarthritis, right hip: Secondary | ICD-10-CM

## 2019-09-24 DIAGNOSIS — E785 Hyperlipidemia, unspecified: Secondary | ICD-10-CM | POA: Diagnosis present

## 2019-09-24 DIAGNOSIS — Z8546 Personal history of malignant neoplasm of prostate: Secondary | ICD-10-CM

## 2019-09-24 DIAGNOSIS — Z96649 Presence of unspecified artificial hip joint: Secondary | ICD-10-CM

## 2019-09-24 HISTORY — PX: PR ARTHRP ACETBLR/PROX FEM PROSTC AGRFT/ALGRFT: 27130

## 2019-09-24 HISTORY — DX: Presence of unspecified artificial hip joint: Z96.649

## 2019-09-24 LAB — BASIC METABOLIC PANEL
Anion Gap: 11 (ref 7–16)
CO2: 25 mmol/L (ref 20–28)
Calcium: 8.6 mg/dL (ref 8.6–10.2)
Chloride: 101 mmol/L (ref 96–108)
Creatinine: 0.98 mg/dL (ref 0.67–1.17)
GFR,Black: 97 *
GFR,Caucasian: 84 *
Glucose: 144 mg/dL — ABNORMAL HIGH (ref 60–99)
Lab: 22 mg/dL — ABNORMAL HIGH (ref 6–20)
Potassium: 3.9 mmol/L (ref 3.3–5.1)
Sodium: 137 mmol/L (ref 133–145)

## 2019-09-24 LAB — TYPE AND SCREEN
ABO RH Blood Type: B POS
Antibody Screen: NEGATIVE

## 2019-09-24 LAB — MCHC: MCHC: 33 g/dL (ref 32–37)

## 2019-09-24 LAB — HCT AND HGB
Hematocrit: 37 % — ABNORMAL LOW (ref 40–51)
Hemoglobin: 11.9 g/dL — ABNORMAL LOW (ref 13.7–17.5)

## 2019-09-24 LAB — POCT GLUCOSE: Glucose POCT: 79 mg/dL (ref 60–99)

## 2019-09-24 SURGERY — ARTHROPLASTY, HIP, TOTAL
Anesthesia: General | Site: Hip | Laterality: Right | Wound class: Clean

## 2019-09-24 MED ORDER — PHENYLEPHRINE 100 MCG/ML IN NS 10 ML *WRAPPED*
INTRAMUSCULAR | Status: AC
Start: 2019-09-24 — End: 2019-09-24
  Filled 2019-09-24: qty 10

## 2019-09-24 MED ORDER — ASPIRIN 81 MG PO TBEC *I*
81.0000 mg | DELAYED_RELEASE_TABLET | Freq: Two times a day (BID) | ORAL | Status: DC
Start: 2019-09-24 — End: 2019-09-25
  Administered 2019-09-24 – 2019-09-25 (×2): 81 mg via ORAL
  Filled 2019-09-24 (×2): qty 1

## 2019-09-24 MED ORDER — PROPOFOL INFUSION 10 MG/ML *I*
INTRAVENOUS | Status: AC
Start: 2019-09-24 — End: 2019-09-24
  Filled 2019-09-24: qty 200

## 2019-09-24 MED ORDER — CAMPHOR-MENTHOL 0.5-0.5 % EX LOTN *I*
TOPICAL_LOTION | CUTANEOUS | Status: DC | PRN
Start: 2019-09-24 — End: 2019-09-25

## 2019-09-24 MED ORDER — DEXTROSE 5 % FLUSH FOR PUMPS *I*
0.0000 mL/h | INTRAVENOUS | Status: DC | PRN
Start: 2019-09-24 — End: 2019-09-25

## 2019-09-24 MED ORDER — LIDOCAINE HCL 2 % IJ SOLN *I*
INTRAMUSCULAR | Status: DC | PRN
Start: 2019-09-24 — End: 2019-09-24
  Administered 2019-09-24: 60 mg via INTRAVENOUS

## 2019-09-24 MED ORDER — BISACODYL 10 MG RE SUPP *I*
10.0000 mg | Freq: Every day | RECTAL | Status: DC | PRN
Start: 2019-09-24 — End: 2019-09-25
  Filled 2019-09-24: qty 1

## 2019-09-24 MED ORDER — SODIUM CHLORIDE 0.9 % FLUSH FOR PUMPS *I*
0.0000 mL/h | INTRAVENOUS | Status: DC | PRN
Start: 2019-09-24 — End: 2019-09-24

## 2019-09-24 MED ORDER — TRANEXAMIC ACID 1000 MG / 0.7% NACL 100 ML IVPB *I*
INTRAVENOUS | Status: AC
Start: 2019-09-24 — End: 2019-09-24
  Filled 2019-09-24: qty 100

## 2019-09-24 MED ORDER — ONDANSETRON HCL 2 MG/ML IV SOLN *I*
4.0000 mg | Freq: Once | INTRAMUSCULAR | Status: DC | PRN
Start: 2019-09-24 — End: 2019-09-24

## 2019-09-24 MED ORDER — PHENYLEPHRINE 100 MCG/ML IN NS 10 ML *WRAPPED*
INTRAMUSCULAR | Status: DC | PRN
Start: 2019-09-24 — End: 2019-09-24
  Administered 2019-09-24 (×5): 200 ug via INTRAVENOUS

## 2019-09-24 MED ORDER — CEFAZOLIN 2000 MG IN STERILE WATER 20ML SYRINGE *I*
2000.0000 mg | PREFILLED_SYRINGE | Freq: Once | INTRAVENOUS | Status: AC
Start: 2019-09-24 — End: 2019-09-24
  Administered 2019-09-24: 2000 mg via INTRAVENOUS
  Filled 2019-09-24: qty 20

## 2019-09-24 MED ORDER — POLYETHYLENE GLYCOL 3350 PO PACK 17 GM *I*
17.0000 g | PACK | Freq: Every day | ORAL | Status: DC
Start: 2019-09-24 — End: 2019-09-25
  Administered 2019-09-24 – 2019-09-25 (×2): 17 g via ORAL
  Filled 2019-09-24 (×2): qty 17

## 2019-09-24 MED ORDER — SODIUM CHLORIDE 0.9 % IV SOLN WRAPPED *I*
125.0000 mL/h | Status: DC
Start: 2019-09-24 — End: 2019-09-25
  Administered 2019-09-24: 125 mL/h via INTRAVENOUS

## 2019-09-24 MED ORDER — PROPOFOL INFUSION 10 MG/ML *I*
INTRAVENOUS | Status: DC | PRN
Start: 2019-09-24 — End: 2019-09-24
  Administered 2019-09-24: 60 ug/kg/min via INTRAVENOUS
  Administered 2019-09-24: 125 ug/kg/min via INTRAVENOUS

## 2019-09-24 MED ORDER — DOCUSATE SODIUM 100 MG PO CAPS *I*
200.0000 mg | ORAL_CAPSULE | Freq: Every day | ORAL | Status: DC
Start: 2019-09-24 — End: 2019-09-25
  Administered 2019-09-24 – 2019-09-25 (×2): 200 mg via ORAL
  Filled 2019-09-24 (×2): qty 2

## 2019-09-24 MED ORDER — CELECOXIB 200 MG PO CAPS *I*
200.0000 mg | ORAL_CAPSULE | Freq: Once | ORAL | Status: AC
Start: 2019-09-24 — End: 2019-09-24
  Administered 2019-09-24: 200 mg via ORAL
  Filled 2019-09-24: qty 1

## 2019-09-24 MED ORDER — MIDAZOLAM HCL 1 MG/ML IJ SOLN *I* WRAPPED
INTRAMUSCULAR | Status: DC | PRN
Start: 2019-09-24 — End: 2019-09-24
  Administered 2019-09-24: 2 mg via INTRAVENOUS

## 2019-09-24 MED ORDER — CHLORHEXIDINE GLUCONATE 0.12 % MT SOLN *STORES SUPPLIED*
15.0000 mL | Freq: Once | OROMUCOSAL | Status: AC
Start: 2019-09-24 — End: 2019-09-24
  Administered 2019-09-24: 15 mL via OROMUCOSAL

## 2019-09-24 MED ORDER — CALCIUM CARBONATE ANTACID 500 MG PO CHEW *I*
1000.0000 mg | CHEWABLE_TABLET | Freq: Three times a day (TID) | ORAL | Status: DC | PRN
Start: 2019-09-24 — End: 2019-09-25

## 2019-09-24 MED ORDER — HALOPERIDOL LACTATE 5 MG/ML IJ SOLN *I*
INTRAMUSCULAR | Status: AC
Start: 2019-09-24 — End: 2019-09-24
  Filled 2019-09-24: qty 1

## 2019-09-24 MED ORDER — TRANEXAMIC ACID 1000 MG / 0.7% NACL 100 ML IVPB *I*
1000.0000 mg | Freq: Once | INTRAVENOUS | Status: AC
Start: 2019-09-24 — End: 2019-09-24
  Administered 2019-09-24: 1000 mg via INTRAVENOUS

## 2019-09-24 MED ORDER — MEPERIDINE HCL 25 MG/ML IJ SOLN *I*
12.5000 mg | INTRAMUSCULAR | Status: DC | PRN
Start: 2019-09-24 — End: 2019-09-24

## 2019-09-24 MED ORDER — PROPOFOL 10 MG/ML IV EMUL (INTERMITTENT DOSING) WRAPPED *I*
INTRAVENOUS | Status: DC | PRN
Start: 2019-09-24 — End: 2019-09-24
  Administered 2019-09-24: 80 mg via INTRAVENOUS
  Administered 2019-09-24: 30 mg via INTRAVENOUS
  Administered 2019-09-24: 20 mg via INTRAVENOUS

## 2019-09-24 MED ORDER — HALOPERIDOL LACTATE 5 MG/ML IJ SOLN *I*
INTRAMUSCULAR | Status: DC | PRN
Start: 2019-09-24 — End: 2019-09-24
  Administered 2019-09-24: 1 mg via INTRAVENOUS

## 2019-09-24 MED ORDER — SODIUM CHLORIDE 0.9 % FLUSH FOR PUMPS *I*
0.0000 mL/h | INTRAVENOUS | Status: DC | PRN
Start: 2019-09-24 — End: 2019-09-25

## 2019-09-24 MED ORDER — LACTATED RINGERS IV SOLN *I*
20.0000 mL/h | INTRAVENOUS | Status: DC
Start: 2019-09-24 — End: 2019-09-24
  Administered 2019-09-24: 20 mL/h via INTRAVENOUS

## 2019-09-24 MED ORDER — SODIUM CHLORIDE 0.9 % IR SOLN *I*
Status: DC | PRN
Start: 2019-09-24 — End: 2019-09-24
  Administered 2019-09-24: 250 mL

## 2019-09-24 MED ORDER — KETOROLAC TROMETHAMINE 30 MG/ML IJ SOLN *I*
INTRAMUSCULAR | Status: AC
Start: 2019-09-24 — End: 2019-09-24
  Filled 2019-09-24: qty 1

## 2019-09-24 MED ORDER — OXYCODONE HCL 5 MG PO TABS *I*
10.0000 mg | ORAL_TABLET | ORAL | Status: DC | PRN
Start: 2019-09-24 — End: 2019-09-25

## 2019-09-24 MED ORDER — KETOROLAC TROMETHAMINE 30 MG/ML IJ SOLN *I*
INTRAMUSCULAR | Status: DC | PRN
Start: 2019-09-24 — End: 2019-09-24
  Administered 2019-09-24: 61 mL via INTRA_ARTICULAR

## 2019-09-24 MED ORDER — OXYCODONE HCL 5 MG PO TABS *I*
5.0000 mg | ORAL_TABLET | ORAL | Status: DC | PRN
Start: 2019-09-24 — End: 2019-09-25
  Administered 2019-09-24: 5 mg via ORAL
  Filled 2019-09-24 (×2): qty 1

## 2019-09-24 MED ORDER — LACTATED RINGERS IV SOLN *I*
125.0000 mL/h | INTRAVENOUS | Status: DC
Start: 2019-09-24 — End: 2019-09-24
  Administered 2019-09-24: 125 mL/h via INTRAVENOUS

## 2019-09-24 MED ORDER — BUPIVACAINE-EPINEPHRINE 0.25 % IJ SOLUTION *WRAPPED*
INTRAMUSCULAR | Status: AC
Start: 2019-09-24 — End: 2019-09-24
  Filled 2019-09-24: qty 60

## 2019-09-24 MED ORDER — TRAZODONE HCL 50 MG PO TABS *I*
50.0000 mg | ORAL_TABLET | Freq: Every evening | ORAL | Status: DC | PRN
Start: 2019-09-24 — End: 2019-09-25

## 2019-09-24 MED ORDER — CEFAZOLIN 2000 MG IN STERILE WATER 20ML SYRINGE *I*
2000.0000 mg | PREFILLED_SYRINGE | Freq: Three times a day (TID) | INTRAVENOUS | Status: AC
Start: 2019-09-24 — End: 2019-09-24
  Administered 2019-09-24 (×2): 2000 mg via INTRAVENOUS
  Filled 2019-09-24 (×2): qty 20

## 2019-09-24 MED ORDER — EPHEDRINE 5MG/ML IN NS IV/IJ *WRAPPED*
INTRAMUSCULAR | Status: DC | PRN
Start: 2019-09-24 — End: 2019-09-24
  Administered 2019-09-24 (×2): 12.5 mg via INTRAVENOUS

## 2019-09-24 MED ORDER — LIDOCAINE HCL 1 % IJ SOLN *I*
INTRAMUSCULAR | Status: DC | PRN
Start: 2019-09-24 — End: 2019-09-24
  Administered 2019-09-24: 2 mL via SUBCUTANEOUS

## 2019-09-24 MED ORDER — PROPOFOL 10 MG/ML IV EMUL (INTERMITTENT DOSING) WRAPPED *I*
INTRAVENOUS | Status: AC
Start: 2019-09-24 — End: 2019-09-24
  Filled 2019-09-24: qty 20

## 2019-09-24 MED ORDER — SENNOSIDES 8.6 MG PO TABS *I*
2.0000 | ORAL_TABLET | Freq: Every evening | ORAL | Status: DC
Start: 2019-09-24 — End: 2019-09-25
  Administered 2019-09-24: 2 via ORAL
  Filled 2019-09-24: qty 2

## 2019-09-24 MED ORDER — SODIUM CHLORIDE 0.9 % INJ (FLUSH) WRAPPED *I*
Status: AC
Start: 2019-09-24 — End: 2019-09-24
  Filled 2019-09-24: qty 10

## 2019-09-24 MED ORDER — POVIDONE-IODINE 5 % EX SOLN *I*
Freq: Once | CUTANEOUS | Status: AC
Start: 2019-09-24 — End: 2019-09-24
  Administered 2019-09-24: 4 via TOPICAL

## 2019-09-24 MED ORDER — MIDAZOLAM HCL 1 MG/ML IJ SOLN *I* WRAPPED
INTRAMUSCULAR | Status: AC
Start: 2019-09-24 — End: 2019-09-24
  Filled 2019-09-24: qty 2

## 2019-09-24 MED ORDER — MELOXICAM 7.5 MG PO TABS *I*
7.5000 mg | ORAL_TABLET | Freq: Every day | ORAL | Status: DC
Start: 2019-09-25 — End: 2019-09-25
  Administered 2019-09-25: 7.5 mg via ORAL
  Filled 2019-09-24: qty 1

## 2019-09-24 MED ORDER — HYDROMORPHONE HCL PF 1 MG/ML IJ SOLN *WRAPPED*
0.5000 mg | INTRAMUSCULAR | Status: DC | PRN
Start: 2019-09-24 — End: 2019-09-24

## 2019-09-24 MED ORDER — SODIUM CHLORIDE 0.9 % 25 ML IV SOLN *I*
6.2500 mg | Freq: Once | INTRAVENOUS | Status: DC | PRN
Start: 2019-09-24 — End: 2019-09-24

## 2019-09-24 MED ORDER — SODIUM CHLORIDE 0.9 % IV SOLN WRAPPED *I*
20.0000 mL/h | Status: DC
Start: 2019-09-24 — End: 2019-09-24

## 2019-09-24 MED ORDER — LIDOCAINE HCL (PF) 1 % IJ SOLN *I*
0.1000 mL | INTRAMUSCULAR | Status: DC | PRN
Start: 2019-09-24 — End: 2019-09-24
  Administered 2019-09-24: 0.1 mL via SUBCUTANEOUS
  Filled 2019-09-24: qty 2

## 2019-09-24 MED ORDER — FENTANYL CITRATE 50 MCG/ML IJ SOLN *WRAPPED*
INTRAMUSCULAR | Status: AC
Start: 2019-09-24 — End: 2019-09-24
  Filled 2019-09-24: qty 2

## 2019-09-24 MED ORDER — ONDANSETRON HCL 2 MG/ML IV SOLN *I*
4.0000 mg | Freq: Four times a day (QID) | INTRAMUSCULAR | Status: DC | PRN
Start: 2019-09-24 — End: 2019-09-25

## 2019-09-24 MED ORDER — MORPHINE SULFATE ER 15 MG PO TBCR *I*
15.0000 mg | ORAL_TABLET | Freq: Two times a day (BID) | ORAL | Status: DC
Start: 2019-09-24 — End: 2019-09-25
  Administered 2019-09-24 – 2019-09-25 (×3): 15 mg via ORAL
  Filled 2019-09-24 (×3): qty 1

## 2019-09-24 MED ORDER — ACETAMINOPHEN 500 MG PO TABS *I*
1000.0000 mg | ORAL_TABLET | Freq: Three times a day (TID) | ORAL | Status: DC
Start: 2019-09-24 — End: 2019-09-25
  Administered 2019-09-24 – 2019-09-25 (×3): 1000 mg via ORAL
  Filled 2019-09-24 (×3): qty 2

## 2019-09-24 MED ORDER — ACETAMINOPHEN 500 MG PO TABS *I*
1000.0000 mg | ORAL_TABLET | Freq: Once | ORAL | Status: AC
Start: 2019-09-24 — End: 2019-09-24
  Administered 2019-09-24: 1000 mg via ORAL
  Filled 2019-09-24: qty 2

## 2019-09-24 MED ORDER — EPHEDRINE 5MG/ML IN NS IV/IJ *WRAPPED*
INTRAMUSCULAR | Status: AC
Start: 2019-09-24 — End: 2019-09-24
  Filled 2019-09-24: qty 5

## 2019-09-24 MED ORDER — BUPIVACAINE IN DEXTROSE 0.75-8.25 % IT SOLN *I*
INTRATHECAL | Status: DC | PRN
Start: 2019-09-24 — End: 2019-09-24
  Administered 2019-09-24: 1.2 mL via INTRATHECAL

## 2019-09-24 MED ORDER — DEXAMETHASONE SODIUM PHOSPHATE 4 MG/ML INJ SOLN *WRAPPED*
INTRAMUSCULAR | Status: DC | PRN
Start: 2019-09-24 — End: 2019-09-24
  Administered 2019-09-24: 8 mg via INTRAVENOUS

## 2019-09-24 MED ORDER — FENTANYL CITRATE 50 MCG/ML IJ SOLN *WRAPPED*
INTRAMUSCULAR | Status: DC | PRN
Start: 2019-09-24 — End: 2019-09-24
  Administered 2019-09-24: 50 ug via INTRAVENOUS

## 2019-09-24 MED ORDER — LIDOCAINE HCL 2 % (PF) IJ SOLN *I*
INTRAMUSCULAR | Status: AC
Start: 2019-09-24 — End: 2019-09-24
  Filled 2019-09-24: qty 5

## 2019-09-24 MED ORDER — DEXAMETHASONE SODIUM PHOSPHATE 4 MG/ML INJ SOLN *WRAPPED*
INTRAMUSCULAR | Status: AC
Start: 2019-09-24 — End: 2019-09-24
  Filled 2019-09-24: qty 2

## 2019-09-24 SURGICAL SUPPLY — 60 items
APPLICATOR CHLORAPREP STER  ORANGE 26ML (Supply) ×6 IMPLANT
APPLICATOR CLEAR CHLORAPREP 26ML (Supply) ×3 IMPLANT
BANDAGE SLF ADHER 6IN STER LF (Dressing) ×3 IMPLANT
BIT DRILL TWIST 2.4X127MM SS (Supply) ×3 IMPLANT
BLADE CLIPPER SURG (Supply)
BLADE SAG NARR 12.45 X 1.19 X 82.18MM (Supply) ×3 IMPLANT
BLADE SUR CLIPPER W37.2MM CUT H0.23MM GEN PURP EXISTING CLP HNDL DISP (Supply) IMPLANT
BLADE SUR W37.2MM CUT H0.23MM GEN PURP EXISTING CLP HNDL DISP (Supply) IMPLANT
DRAPE MEDIUM SURGICAL BILAMINATE W40XL70IN (Drape) ×3 IMPLANT
DRAPE SHEET 70X100 (Drape) ×4
DRAPE SPLIT 77X120 (Drape) ×2
DRAPE SUR W70XL100IN STD SMS POLYPR FULL SHT W/O FLD PCH DISP (Drape) ×2 IMPLANT
DRAPE SUR W77XL120IN SMS SPL W/ ADH DISP (Drape) ×1 IMPLANT
DRAPE SURG IOBAN 2 ANTIMIC LG 23 X 17IN (Drape) ×3 IMPLANT
DRESSING ABD PAD DERMACEA 5 X 9IN STER (Dressing) ×3 IMPLANT
DRESSING PICO W/PUMP 4"X12" (10CMX30CM) (Dressing)
DRESSING PICO W/PUMP 4INX12IN 10CMX30CM (Dressing) IMPLANT
DRESSING TEGADERM W/LABEL 2 3/8 X 2 3/4 (Dressing) ×2 IMPLANT
DRESSING TEGADERM W/LABEL 4 X 4 3/4IN (Dressing) ×9 IMPLANT
ELECTRODE EXT BLADE SS 6.5IN (Supply) ×3 IMPLANT
FEMORAL HEAD 36/0MM DELTA CERAMIC (Implant) ×2 IMPLANT
FILTER NEPTUNE 4PORT MANIFOLD (Supply) ×3 IMPLANT
GLOVE BIOGEL PI ORTHOPRO SZ 6.5 LF (Glove) ×2 IMPLANT
GLOVE BIOGEL PI ORTHOPRO SZ 7.5 LF (Glove) ×2 IMPLANT
GLOVE BIOGEL PI ORTHOPRO SZ 8 LF (Glove) ×5 IMPLANT
GLOVE SURG BIOGEL PI ULTRATOUCH SZ 6.0 (Glove) ×6 IMPLANT
GLOVE SURG BIOGEL PI ULTRATOUCH SZ 7.0 (Glove) ×6 IMPLANT
GLOVE SURG BIOGEL PI ULTRATOUCH SZ 7.5 (Glove) ×18 IMPLANT
GOWN SIRUS FABRIC REINFORCED SET IN XL (Gown) ×3 IMPLANT
HOOD SURG ISOLATION FLYTE SURGICOOL PEEL-AWAY (Supply) ×3 IMPLANT
LINER ACETABULAR G7 LONGEVITY POLYETHYLENE NEUTRAL 36MM SIZE F (Implant) ×2 IMPLANT
PACK CUSTOM TOTAL HIP TRAY (Pack) ×3 IMPLANT
PAD POST FOAM 6FT ROLL DISP (Supply) ×3 IMPLANT
PEN SURG MARKER REG TIP (Other) ×3 IMPLANT
PROTECTOR ULNA NERVE ~~LOC~~ (Supply) ×3 IMPLANT
RETRIEVER HEWSON SUTR (Supply) ×3 IMPLANT
ROLL COTTON STERILE 1 LB QUALTEX (Dressing) ×3 IMPLANT
SCREW BNE L30MM DIA6.5MM TIV ST COUNTERSUNK FOR ACET SYS (Screw) ×4 IMPLANT
SHELL ACETABULAR G7 3 HOLE POROUS PLASMA (Implant) ×2 IMPLANT
SLEEVE COMP KNEE MED BLENDED (Supply) ×3 IMPLANT
SOL H2O IRRIG STER 1000ML BTL (Solution) ×2
SOL POVIDONE STERILE 10 PCT 3/4OZ (Solution) ×3 IMPLANT
SOL SOD CHL IRRIG 500ML BTL (Solution) ×3 IMPLANT
SOL SOD CHL IV .9PCT 1000ML BAG (Drug) ×3 IMPLANT
SOL WATER IRRIG STERILE 1000ML BTL (Solution) ×4 IMPLANT
SPIKE MINI DISPENSING PIN (BBRAUN DP1000) (Supply) ×3 IMPLANT
STEM TAPERLOC 15MM FEM POR COAT (Implant) ×2 IMPLANT
STRAP REPLCMT LITHOTOMY 10 X 1.5IN (Supply) ×3 IMPLANT
SUCTION YANKAUER HIGH CAP. (Supply) ×5 IMPLANT
SUTR MONOCRYL PLUS 3-0PS2 27IN UNDY (Suture) ×3 IMPLANT
SUTR QUILL MONODERM PREC 2-0 24MM 30X30 (Suture) ×3 IMPLANT
SUTR VICRYL ANTIB 1 CTX 18 POP VIOLET (Suture) ×3 IMPLANT
SUTURE ETHBND EXCEL SZ 2 L30IN NONABSORBABLE GRN L40MM V-37 1/2 CIR TAPERCUT NDL POLY COAT (Suture) ×3 IMPLANT
SUTURE QUILL SZ 2 L2IN ABSRB VLT L48MM 1/2 CIR DA TAPR CUT NDL POLYDIOXANONE MFIL (Suture) ×3 IMPLANT
SYRINGE  LUER LOCK  STERILE  60ML (Other) ×4
SYRINGE 60ML  LUER LOCK  STERILE (Other) ×2 IMPLANT
SYRINGE IRRIG 60ML BULB SOFT PLIABLE ONE HANDED USE W/TYVEK LID (Syringe) ×3 IMPLANT
SYRINGE TB SAFETYGLIDE 1CC 27G X 1/2IN (Syringe) ×3 IMPLANT
TAPE DURAPORE 3IN SILK NL (Dressing) ×3 IMPLANT
TAPE SURG TRANSPORE 1IN X 10YD LF (Supply) ×3 IMPLANT

## 2019-09-24 NOTE — Progress Notes (Signed)
Physical Therapy    Initial Eval Completed. Patient is a 59 y.o.male who presents POD # 0 s/p R THA posterior approach with Dr. Noni Saupe    Discharge recommendation:  Intermittent supervision/assist, Home PT, Anticipate return to prior living arrangement  Equipment recommendations upon discharge: Rolling Walker  Mobility recommendations for nursing while in hospital: 1A with the rolling walker    Past Medical History:   Diagnosis Date    Anxiety     Arthritis     hip    Cancer     Colon polyp     Osteoarthritis of right hip 08/25/2019       Past Surgical History:   Procedure Laterality Date    HERNIA REPAIR      INGUINAL HERNIA REPAIR      KNEE SURGERY Bilateral     arthroscopy ACL reconstruction    PR LAP, VENTRAL HERNIA REPAIR,REDUCIBLE N/A 10/29/2016    Procedure:  UMBILICAL HERNIA REPAIR;  Surgeon: Suszanne Finch, MD;  Location[facility];  Service: General    PR [place] Bilateral 10/29/2016    Procedure:  LAPAROSCOPIC BILATERAL  INGUINAL HERNIA REPAIR with mesh;  Surgeon: Suszanne Finch, MD;  Location: [facility] MAIN OR;  Service: General    PR [place] Left 01/29/2019    Procedure: LAPAROSCOPIC REPAIR WITH MESH, HERNIA, LEFT INGUINAL, ROBOT-ASSISTED;  Surgeon: Suszanne Finch, MD;  Location[facility];  Service: General    UMBILICAL [place]      Surgery Of Male Genitalia Vasectomy Conversion Data        *Bold Indicates co-morbidities affecting treatment and recovery    Comorbidities affecting treatment/recovery in addition to those listed above:  Total hip replacement    Personal factors affecting treatment/recovery:   Recent hospital admission    Clinical presentation:   evolving       Patient complexity:  low level as indicated by above personal factors, environmental factors and comorbidities in addition to their impairments found on physical exam.    PT Adult Assessment - 09/24/19 1143        Prior Living     Prior Living Situation   Reported by patient     Lives With  Alone     Receives Help From  Independent     Type of [place]     # Steps to [place]  3     # Rails to [place]  1     Location of Bedrooms  1st floor     Location of Bathrooms  1st floor     Bathroom Equipment  Raised toilet [place] in [place]     Additional Comments  Patient's spouse can assist prn upon discharge        Prior Function Level    Prior Function Level  Reported by patient     Transfers  Independent     Field seismologist  None     Walking  Independent     Walking assistive devices used  None     Stair negotiation  Independent        PT Tracking    PT TRACKING  PT Assigned     Type of Session  evaluation     SW Request?  No        Treatment Day    Treatment Day  1  Precautions/Observations    Precautions used  Yes     Total Hip Replacement  Surgical Approach: Posterior lateral;No hip precautions per MD order;Elevated toilet seat     Weight Bearing Status  Right Lower Extremity - weight bearing as tolerated     LDA Observation  IV lines     Isolation Precautions  Eye Protection     Was patient wearing a mask?  Yes     PPE worn by Warehouse manager;Face Shield;Mask     Fall Precautions  Patient educated to use call light for nursing assist prior to getting up;General falls precautions;White board updated with appropriate mobility status        Pain Assessment    *Is the patient currently in pain?  Denies        Vision     Current Vision  Wears corrective lenses        Cognition    Cognition  Tested     Arousal/Alertness  Appropriate responses to stimuli     Orientation  Alert and oriented x3     Following Commands  Follows simple commands without difficulty        UE Assessment    UE Assessment  Full range RUE AROM;Full range LUE AROM        LE Assessment    LE Assessment  Impaired AROM RLE;Full AROM LLE        Sensation    Sensation  No apparent deficit        Coordination    Coordination  --    Appears intact       Bed Mobility     Bed mobility  Tested     Supine to Sit  Verbal cues;1 person assist;Stand by assistance;Contact guard;Head of bed elevated;Side rails up (#)     Sit to Supine  Not tested     Additional comments  The patient required verbal/tactile cues to facilitate sequencing of transfer to the edge of the bed. No dizziness/lightheadedness with supine to sit transfer.        Transfers    Transfers  Tested     Sit to [employer] cues;Contact guard;1 person assist     Stand to sit  Verbal cues;Contact guard;1 person assist     Transfer Assistive Device  rolling walker     Additional comments  The patient required verbal cues for safe hand and foot placement before reaching for the rolling walker in standing; tactile cues provided for steadiness with transfer into standing        Mobility    Mobility  Tested     Weight Bearing Status  RLE     Weight Bearing Status RLE  Right Lower Extremity - weight bearing as tolerated     Gait Pattern  2 point;3 point;Decreased cadence;Decreased stance time R;Decreased L step length;Decreased L step height     Ambulation Assist  Contact guard;1 person assist     Ambulation Distance (Feet)  75 ft     Ambulation Assistive Device  rolling walker     Additional comments  The patient required verbal cues for correct sequencing of gait with the rolling walker; the patient also required verbal cues to take steps when negotiating turns with the rolling walker        Family/Caregiver Training`    Patient/Family/Caregiver training  Yes     Patient training  Role of physical therapy in hospital and plan for evaluation and follow up;Post-operative precautions;Discharge planning;PT  plan of care after evaluation;Purpose and importance of icing and recommendations for ice frequency and duration;Use of assistive device;Equipment recommendations for discharge;Benefits of oob activity and mobility during hospitalization, including frequency of ambulation and change of position;Call don't fall, purpose of bed/chair  alarm, and recommendation for nursing assistance during all oob mobility        Balance    Balance  Tested     Sitting - Static  Standby assist;Unsupported     Standing - Static  Contact guard;Supported     Standing - Merchandiser, retail guard;Supported        Functional Outcome Measures    Functional Outcome Measures  Yes        PT AM-PAC Mobility    Turning over in bed?  3     Sitting down on and standing up from a chair with arms?  1     Moving from lying on back to sitting on the side of the bed?  3     Moving to and from a bed to a chair?  3     Need to walk in hospital room?  3     Climbing 3 - 5 steps with a railing?  3     Total Raw Score  16        Gait Speed    Assistive Device  rolling walker     Distance Walked (meters)  3.05 m     Time it took to walk distance (seconds)  7.45 sec     Gait speed (m/sec)  0.41 m/sec        Additional Comments    Additional comments  The patient was transferred to the bedside recliner after PT evaluation. His personal items and call bell were within reach        Assessment    Brief Assessment  Appropriate for skilled therapy     Problem List  Impaired LE ROM;Impaired LE strength;Impaired endurance;Impaired balance;Impaired transfers;Impaired ambulation;Impaired functional status;Impaired mobility;Impaired bed mobility;Impaired functional mobility     Patient / Family Goal  return home        Plan/Recommendation    Treatment Interventions  Restorative PT;Assess functional mobility     PT Frequency  5-7x/wk;BID Visit     Mobility Recommendations  1A with the rolling walker     Referral Recommendations  OT;SW     Discharge Recommendations  Intermittent supervision/assist;Home PT;Anticipate return to prior living arrangement     PT Discharge Equipment Recommended  Rolling Walker     Assessment/Recommendations Reviewed With:  Patient;Nursing     Next PT Visit  progress per protocol        Time Calculation    PT Timed Codes  0     PT Untimed Codes  17     PT Unbilled Time  0     PT  Total Treatment  17        Plan and Onset date    Plan of Care Date  09/24/19     Onset Date  09/24/19     Treatment Start Date  09/24/19         AMPAC Score Interpretation:  Adapted from Worthy Flank, et al Association of AM-PAC 6 Clicks Basic Mobility and Daily Activity Scores with discharge destination, 2021  AM-PAC Raw Score of > 16 (standardized score > 40.78, % impairment > 54%) indicates a high likelihood of discharge to home     Adapted from Corwin Levins al Prediction of  disposition within 48-hours of hospital admission using patient mobility scores, 2019  AM-PAC raw score  ? 15 (standardized score ? 39, % impairment ? 58%) indicates a high probability of a discharge to home  AM-PAC raw score 9-14 (standardized score ? 31, % impairment 81%-61%) and age < 64 is more likely to discharge to home  AM-PAC raw score 9-14 (standardized score ? 31, % impairment 81%-61%) and age > 28 is more likely to require post-acute care  AM-PAC raw score < 9 (standardized score < 31, % impairment < 81%) indicates pt is more likely to require post-acute care    Ermalene Searing. Anice Paganini, PT, DPT  Pager x [phone]

## 2019-09-24 NOTE — Plan of Care (Signed)
Problem: Impaired Bed Mobility  Goal: STG - IMPROVE BED MOBILITY  Note: Patient will perform bed mobility without rails and the head of bed flat with Modified independence     Time frame: 1-3 days     Problem: Impaired Transfers  Goal: STG - IMPROVE TRANSFERS  Note: Patient will complete Sit to stand transfers using a rolling walker with Modified independence     Time frame: 1-3 days     Problem: Impaired Ambulation  Goal: LTG - IMPROVE AMBULATION  Note: Patient will ambulate 150-299 feet using a rolling walker with Modified independence    Time frame: 3-5 days       Problem: Impaired Stair Navigation  Goal: LTG - IMPROVE STAIR NAVIGATION  Note: Patient will navigate less than 4 steps with 1  rail(s) and least restrictive assistive device and Modified independence     Time frame: 3-5 days

## 2019-09-24 NOTE — Progress Notes (Signed)
Physical Therapy    Treatment session completed.      Discharge recommendation:  Intermittent supervision/assist, Home PT, Anticipate return to prior living arrangement  Equipment recommendations upon discharge: Rolling Walker  Mobility recommendations for nursing while in hospital: 1A with the rolling walker    PT Adult Assessment - 09/24/19 1440        PT Tracking    PT TRACKING  PT Assigned     Type of Session  follow up/treatment     SW Request?  No        Treatment Day    Treatment Day  1        Precautions/Observations    Precautions used  Yes     Total Hip Replacement  Surgical Approach: Posterior lateral;No hip precautions per MD order;Elevated toilet seat     Weight Bearing Status  Right Lower Extremity - weight bearing as tolerated     LDA Observation  IV lines     Isolation Precautions  Eye Protection     Was patient wearing a mask?  Yes     PPE worn by Warehouse manager;Face Shield;Mask     Fall Precautions  Patient educated to use call light for nursing assist prior to getting up;General falls precautions;White board updated with appropriate mobility status        Pain Assessment    *Is the patient currently in pain?  Yes     Pain (Before,During, After) Therapy  Before;During     0-10 Scale  1;3     Pain Location  Hip     Pain Orientation  Right     Pain Descriptors  Pressure     Pain Intervention(s)  Cold applied;Repositioned;Refer to nursing for pain management        Vision     Current Vision  Wears corrective lenses        Cognition    Cognition  Tested     Arousal/Alertness  Appropriate responses to stimuli     Orientation  Alert and oriented x3     Following Commands  Follows simple commands without difficulty        Bed Mobility    Bed mobility  Not tested        Transfers    Transfers  Tested     Sit to Stand  Stand by assistance;1 person assist     Stand to sit  Stand by assistance;1 person assist     Transfer Assistive Device  rolling walker        Mobility    Mobility  Tested     Weight Bearing  Status  RLE     Weight Bearing Status RLE  Right Lower Extremity - weight bearing as tolerated     Gait Pattern  2 point;Decreased cadence;Decreased R step length;Decreased R step height;Decreased L step length;Decreased L step height     Ambulation Assist  Stand by;1 person assist     Ambulation Distance (Feet)  130     Ambulation Assistive Device  rolling walker     Stairs Assistance  Stand by     Stair Management Technique  One rail;Step to pattern;With cane;Forwards     Number of Stairs  4     Additional comments  Cues for gait sequencing and safe turning with rolling walker. Educated on stairs and patient demonstrating safely and efficiently        Family/Caregiver Training`    Patient/Family/Caregiver training  Yes  Patient training  Role of physical therapy in hospital and plan for evaluation and follow up;Post-operative precautions;Discharge planning;PT plan of care after evaluation;Purpose and importance of icing and recommendations for ice frequency and duration;Use of assistive device;Equipment recommendations for discharge;Benefits of oob activity and mobility during hospitalization, including frequency of ambulation and change of position;Call don't fall, purpose of bed/chair alarm, and recommendation for nursing assistance during all oob mobility        Therapeutic Exercises    Additional comments  ther ex handout given to patient with review of ankle pumps, heel slides, quad sets, glut sets        PT AM-PAC Mobility    Turning over in bed?  3     Sitting down on and standing up from a chair with arms?  3     Moving from lying on back to sitting on the side of the bed?  3     Moving to and from a bed to a chair?  3     Need to walk in hospital room?  3     Climbing 3 - 5 steps with a railing?  3     Total Raw Score  18        Gait Speed    Assistive Device  rolling walker     Distance Walked (meters)  3.05 m     Time it took to walk distance (seconds)  7.65 sec     Gait speed (m/sec)  0.4 m/sec          Assessment    Brief Assessment  Remains appropriate for skilled therapy     Problem List  Impaired LE ROM;Impaired LE strength     Patient / Family Goal  home tomorrow        Plan/Recommendation    Treatment Interventions  Restorative PT;Assess functional mobility     PT Frequency  5-7x/wk;BID Visit     Mobility Recommendations  1A with the rolling walker     Referral Recommendations  OT;SW     Discharge Recommendations  Intermittent supervision/assist;Home PT;Anticipate return to prior living arrangement     PT Discharge Equipment Recommended  Rolling Walker     Assessment/Recommendations Reviewed With:  Patient;Nursing     Next PT Visit  progress per protocol        Time Calculation    PT Timed Codes  20     PT Untimed Codes  0     PT Unbilled Time  0     PT Total Treatment  20        Plan and Onset date    Plan of Care Date  09/24/19     Onset Date  09/24/19     Treatment Start Date  09/24/19         AMPAC Score Interpretation:  Adapted from Worthy Flank, et al Association of AM-PAC 6 Clicks Basic Mobility and Daily Activity Scores with discharge destination, 2021  AM-PAC Raw Score of > 16 (standardized score > 40.78, % impairment > 54%) indicates a high likelihood of discharge to home     Adapted from [place] Prediction of disposition within 48-hours of hospital admission using patient mobility scores, 2019  AM-PAC raw score  ? 15 (standardized score ? 39, % impairment ? 58%) indicates a high probability of a discharge to home  AM-PAC raw score 9-14 (standardized score ? 31, % impairment 81%-61%) and age < 45 is more likely to discharge to  home  AM-PAC raw score 9-14 (standardized score ? 31, % impairment 81%-61%) and age > 21 is more likely to require post-acute care  AM-PAC raw score < 9 (standardized score < 31, % impairment < 81%) indicates pt is more likely to require post-acute care    Gait Speed Score Interpretation:  Toney Reil, Marzetta Board PT, PhD; Beather Arbour PT, PhD. Walking Speed: the Sixth  Vital Sign." Journal of Geriatric Physical Therapy 32. 2 (2009) : 2-9.)   0 m/s -0.4 m/s: Household walker; Needs intervention to reduce fall risk; Dependent with ADL's and IADL's   0.4 m/s -0.6 m/s: Limited Hydrographic surveyor; Needs intervention to reduce fall risk   0.6 m/s -1.0 m/s: [employer]; Needs intervention to reduce fall risk   m/s- 1.4 m/s: Normal Walking speed, Cross the [employer], Less likely to have adverse events.         [place], [place]

## 2019-09-24 NOTE — Plan of Care (Signed)
Home Health Assessment    Completed by:  , RN - [facility] home care coordinator   Phone: [phone]    Referred by:social work      Source of Information:pt/med record     Home Health indicators present: s/p tha     Barriers to discharge to be addressed: medical/therapy clearance for home    Question Patient?yes  Mask,gloves/eye protection    Plan:home care nursing/therapy for assessment/teaching at discharge    Comments: medical record reviewed for home care,   Met with pt re home care services  Will follow for d/c plan

## 2019-09-24 NOTE — Progress Notes (Signed)
Per Dr. Carlis Abbott, goal for patient is systolic blood pressures > 90.    Mathews Robinsons, RN

## 2019-09-24 NOTE — Progress Notes (Signed)
Patient uses [facility] independently.

## 2019-09-24 NOTE — Anesthesia Procedure Notes (Signed)
---------------------------------------------------------------------------------------------------------------------------------------    NEURAXIAL BLOCK PLACEMENT  Single Shot Spinal    Date of Procedure: 09/24/2019 8:20 AM    Patient Location:  OR    Reason for Block: at surgeon's request    CONSENT AND TIMEOUT     Consent:  Obtained per policy    Timeout: patient identified (name/DOB) , nerve block procedure/site/side verified by patient or family, proper patient position verified, operative procedure/site/side verified by patient or family , needed equipment, monitors, medications and access verified as present and functioning , operative procedure/side/site verified against surgical consent, allergies reviewed with patient/record , operative procedure/side/site verified against surgical schedule, all members of the block team participated in the timeout and anticoagulation/antiplatelet status reviewed    Staff involved in Time Out (excluding authors of note):  Lauren, RN  METHOD:    Patient Position: sitting    Monitoring: blood pressure and continuous pulse oximetry    Sedation Used: yes            For medications used, please see MAR    Level of Sedation: mild      Prep: aseptic technique per protocol, povidone-iodine and patient draped      Successful Approach: midline    Successful Location: L4-5    Attempts (Skin Punctures):  1  SPINAL NEEDLE:      Introducer: 20 G    Type: P[place]an    Gauge: 25 G    Length: 3.5 in    CSF Appearance: clear  OBSERVATIONS:    Block Completion:  Successfully completed    Sensory Levels: bilateral    Paresthesia: none  Neuraxial Blood: blood not aspirated      Motor Block: dense    Patient Reaction to Block: tolerated procedure well and vitals remained stable  STAFF     Performed by: attending anesthesiologist personally performed    Attending Attestation: I personally performed the procedure     Attending: Simonne Come,  MD  ----------------------------------------------------------------------------------------------------------------------------------------

## 2019-09-24 NOTE — Progress Notes (Addendum)
Report Given To  Doristine Church RN E6      Descriptive Sentence / Reason for Admission   Date: 09/24/19 Room / Location: [id number] / [facility] MAIN OR   Anesthesia Start: 0811 Anesthesia Stop:    Procedure: RIGHT ARTHROPLASTY, HIP, TOTAL  (Right Hip) Diagnosis:        Osteoarthritis of right hip       (Osteoarthritis of right hip [M16.11])   Surgeons: Arley Phenix, MD Attending Anesthesia: Simonne Come, MD         Active Issues / Relevant Events   Patient received Bupivacaine spinal - YES  Has sensation at level - L4  Patient NOT on BG protocol per PACU protocol.  Last pain score - 0/10  Oxygen is - 97% on RA  Medications administered - N/A  Comorbidities include - prostate cancer s/p radiation, HLD, arthritis.  Xray complete in PACU - YES  Bladder scanned for - 500 mL      Mathews Robinsons, RN        To Do List          Anticipatory Guidance / Discharge Planning

## 2019-09-24 NOTE — Invasive Procedure Plan of Care (Signed)
[facility]  OR SURGICAL PROCEDURE                            Patient Name: Timothy Cross  [facility] [id number] MR                                                              DOB: Nov 17, 1960         Please read this form or have someone read it to you.   It's important to understand all parts of this form. If something isn't clear, ask [place] to explain.   When you sign it, that means you understand the form and give [place] permission to do this surgery or procedure.     I agree for Arley Phenix, MD , and RNFA, PA, Orthopaedic Surgery Resident along with any assistants* they may choose, to treat the following condition(s): Right hip arthritis   By doing this surgery or procedure on me: Right hip replacement    This is also known as: Right total hip arthroplasty (THA)   Laterality: Right     *if you'd like a list of the assistants, please ask. We can give that to you.    1. The care provider has explained my condition to me. They have told me how the procedure can help me. They have told me about other ways of treating my condition. I understand the care provider cannot guarantee the result of the procedure. If I don't have this procedure, my other choices are: No surgery, ongoing conservative care    2. The care provider has told me the risks (problems that can happen) of the procedure. I understand there may be unwanted results. The risks that are related to this procedure include: These risks include but are not limited to infection requiring IV antibiotics, and further surgery. We discussed nerve injury resulting in permanent leg weakness/foot drop, orthosis requirement, and gait impairment. Blood vessel injury requiring further surgery for limb salvage, bleeding requiring transfusion, iatrogenic femur and/or acetbular fracture, hip instability, limb lengthening, wound drainage, incomplete relief of symptoms, component failure, reflex  sympathetic dystrophy, risks of anesthesia, risk of blood clots in the legs and/or lungs, post-op ileus, risk of exacerbation of underlying medical issues including heart attack, stroke, respiratory failure, kidney failure, and even death.       3. I understand that during the procedure, my care provider may find a condition that we didn't know about before the treatment started. Therefore, I agree that my care provider can perform any other treatment which they think is necessary and available.    4. I understand the care provider may remove tissue, body parts, or materials during this procedure. These materials may be used to help with my diagnosis and treatment. They might also be used for teaching purposes or for research studies that I have separately agreed to participate in. Otherwise they will be disposed of as required by law.    5. My care provider might want a representative from a [place] to be there during my procedure. I understand that person works for:  [employer]  The ways they might help my care provider during my procedure include:        Helping the operating room staff prepare the special device my care provider wants to use and Providing information and support to operating room staff regarding the device    6. Here are my decisions about receiving blood, blood products, or tissues. I understand my decisions cover the time before, during and after my procedure, my treatment, and my time in the hospital. After my procedure, if my condition changes a lot, my care provider will talk with me again about receiving blood or blood products. At that time, my care provider might need me to review and sign another consent form, about getting or refusing blood.    I understand that the blood is from the community blood supply. Volunteers donated the blood, the volunteers were screened for health problems. The blood was examined with very sensitive and accurate tests to look for  hepatitis, HIV/AIDS, and other diseases. Before I receive blood, it is tested again to make sure it is the correct type.    My chances of getting a sickness from blood products are small. But no transfusion is 100% safe. I understand that my care provider feels the good I will receive from the blood is greater than the chances of something going wrong. My care provider has answered my questions about blood products.      My decision  about blood or  blood products   Yes, I agree to receive blood or blood products if my care provider thinks they're needed.        My decision   about tissue  Implants     Yes, I agree to receive tissue implants if my care provider thinks they're needed.          I understand this  form.    My care provider  or his/her  assistants have  explained:   What I am having done and why I need it.  What other choices I can make instead of having this done.  The benefits and possible risks (problems) to me of having this done.  The benefits and possible risks (problems) to me of receiving transplants, blood, or blood products.  There is no guarantee of the results.  The care provider may not stay with me the entire time that I am in the operating or procedure room.  My provider has explained how this may affect my procedure. My provider has answered my  questions about this.         I give my  permission for  this surgery or  procedure.            _______________________________________________                                     My signature  (or parent or other person authorized to sign for you, if you are unable to sign for  yourself or if you are under 33 years old)        ______           Date        _____        Time   Electronic Signatures will display at the bottom of the consent form.    Care provider's statement: I have discussed the planned procedure, including the possibility for transfusion of blood  products or receipt of tissue as necessary; expected benefits; the possible  complications and risks; and possible alternatives  and their benefits and risks with the patients or the patient's surrogate. In my opinion, the patient or the patient's surrogate  understands the proposed procedure, its risks, benefits and alternatives.              Electronically signed by: Arley Phenix, MD                                                09/24/2019         Date        7:49 AM        Time   Your doctor or someone your doctor has appointed has told you that you may need blood or a blood product transfusion, which has been collected from volunteers, as part of your treatment as a patient.    The reasons you might need blood or blood products include, but are not limited to:    Significant loss of your own blood   Your body may not be getting enough oxygen to its tissues    Treatment of bleeding disorders caused by low platelet counts or platelets that do not work right (platelets are part of a cell that helps to form clots and keeps you from bleeding too much).   You may not have enough of other substances that help your blood clot or stop you from bleeding more  The risks of getting a transfusion of blood or blood products include, but are not limited to:    Damage to the lungs   Difficulty breathing due to fluid in the lungs   The product may contain bacteria or rarely a virus (which includes HIV and Hepatitis)   Blood from the community blood supply has been collected from volunteer donors who have been screened for health risk. The blood has been tested for major blood transmitted disease, but no transfusion is 100% safe. The blood is tested with very sensitive and accurate tests to screen for hepatitis, AIDS, and other disease, which makes the risks very small.   You may have side effects from the transfusion (rash, fever, chills) or an allergic reaction   The transfusion increases your risks of getting infection or cancer coming back   The transfusion can increase the time you have  to stay in the hospital   The transfusion can potentially cause death if the wrong blood is given or your body rejects the blood   Before blood is transfused, it is tested again to make sure it is the correct type  There are other options than getting blood or blood products from other people and they include:    Drugs which can decrease bleeding   Drugs which can cause your body to make more blood (used in elective procedures with advance notice)   Autologous (your own blood) donation (needs pre-arrangement)   No transfusion  If you exercise your right to refuse to be transfused with blood or blood products; these things listed below, among others, could happen to you:   Your body may not get enough oxygen and suffer damage   You may have a higher chance of bleeding   You may limit other options for your condition   You may die from losing too much  blood

## 2019-09-24 NOTE — Anesthesia Procedure Notes (Signed)
---------------------------------------------------------------------------------------------------------------------------------------    AIRWAY   GENERAL INFORMATION AND STAFF    Patient location during procedure: OR       Date of Procedure: 09/24/2019 8:44 AM  CONDITION PRIOR TO MANIPULATION     Current Airway/Neck Condition:  Normal        For more airway physical exam details, see Anesthesia PreOp Evaluation  AIRWAY METHOD     Patient Position:  Sniffing    Preoxygenated: yes      Induction: IV and Routine  Mask Difficulty Assessment:  0 - not attempted    Number of Attempts at Approach:  1    Number of Other Approaches Attempted:  0  FINAL AIRWAY DETAILS    Final Airway Type:  LMA    Final LMA: Unique    LMA Size: 4  ----------------------------------------------------------------------------------------------------------------------------------------

## 2019-09-24 NOTE — Progress Notes (Signed)
Timothy Cross  [id number]    Orthopaedic Progress Note    S: Doing well since OR. Has return of normal sensation, pain well controlled. No complaints. Worked with PT, ambulated. Voided    PE:  Temp:  [36.3 C (97.3 F)-36.9 C (98.4 F)] 36.3 C (97.3 F)  Heart Rate:  [53-65] 56  Resp:  [10-20] 14  BP: (84-134)/(41-71) 117/58  NAD, Awake, alert, cooperative.  RLE: Dressing C/D/I   Foot WWP, DP 2+, Cap refill <2sec   SILT over 1st DWS/medial/lateral/plantar/dorsal foot   +ADF/APF/EHL     A/P 60 y.o. male s/p right THA today.    - Pain Control  - DVT Prophylaxis: ASA  - PT/OT  - No Hip Precautions, raised toilet seat  - Weight-bearing: WBAT RLE  - Ice/Elevate   - PACU Imaging reviewed  - Bowel Regimen   - Dispo: mobilization, pain control    Zachary George, MD  PGY-5  09/24/2019  4:38 PM

## 2019-09-24 NOTE — Progress Notes (Signed)
09/24/19 1618   [facility] Patient Class Review   Patient Class Review Inpatient     Cruzita Lederer, RN   Utilization Management Nurse  Office: [phone]   Pager: [phone]

## 2019-09-24 NOTE — Progress Notes (Signed)
[name] was seen and evaluated at bedside in pre-op holding, H&P reviewed, no changes noted.  We reviewed risks related to total hip arthroplasty (THA) including infection requiring further surgery and prolonged morbidity, bleeding requiring transfusion, nerve injury resulting in permanent leg weakness/foot drop & gait impairment, blood vessel injury requiring further surgery, iatrogenic femur/acetabular fracture, hip instability, limb length inequality, risk of anesthesia, blood clots in the legs and/or lungs, post op ileus, risk of exacerbation of underlying medical issues resulting in heart attack, stroke, respiratory failure, kidney failure, and even death. The patient understood these risks and elected to proceed. Informed consent was obtained verbally, the surgical site initialed, plan to proceed with right THA.    Arley Phenix, MD 09/24/2019 7:48 AM

## 2019-09-24 NOTE — Anesthesia Case Conclusion (Signed)
CASE CONCLUSION  Emergence  Actions:  LMA removed  Criteria Used for Airway Removal:  Adequate Tv & RR, acceptable O2 saturation and following commands  Assessment:  Routine  Transport  Directly to: PACU  Position:  Supine  Patient Condition on Handoff  Level of Consciousness:  Alert/talking/calm  Patient Condition:  Stable  Handoff Report to:  RN

## 2019-09-25 ENCOUNTER — Other Ambulatory Visit: Payer: Self-pay

## 2019-09-25 MED ORDER — ROLLER WALKER MISC *A*
1.0000 [IU] | Freq: Every day | 0 refills | Status: DC
Start: 2019-09-25 — End: 2019-12-21

## 2019-09-25 MED ORDER — OXYCODONE HCL 5 MG PO TABS *I*
2.5000 mg | ORAL_TABLET | ORAL | 0 refills | Status: DC | PRN
Start: 2019-09-25 — End: 2019-12-21
  Filled 2019-09-25: qty 10, 3d supply, fill #0

## 2019-09-25 MED ORDER — ACETAMINOPHEN 500 MG PO TABS *I*
1000.0000 mg | ORAL_TABLET | Freq: Three times a day (TID) | ORAL | 0 refills | Status: DC
Start: 2019-09-25 — End: 2019-12-21
  Filled 2019-09-25: qty 80, 13d supply, fill #0

## 2019-09-25 MED ORDER — MORPHINE SULFATE ER 15 MG PO TBCR *I*
15.0000 mg | ORAL_TABLET | Freq: Two times a day (BID) | ORAL | 0 refills | Status: AC
Start: 2019-09-25 — End: 2019-09-28
  Filled 2019-09-25: qty 6, 3d supply, fill #0

## 2019-09-25 MED ORDER — DOCUSATE SODIUM 100 MG PO CAPS *I*
200.0000 mg | ORAL_CAPSULE | Freq: Every day | ORAL | 0 refills | Status: AC
Start: 2019-09-25 — End: 2019-11-14
  Filled 2019-09-25: qty 100, 50d supply, fill #0

## 2019-09-25 MED ORDER — MELOXICAM 7.5 MG PO TABS *I*
7.5000 mg | ORAL_TABLET | Freq: Every day | ORAL | 0 refills | Status: AC
Start: 2019-09-26 — End: 2019-10-10
  Filled 2019-09-25: qty 14, 14d supply, fill #0

## 2019-09-25 MED ORDER — SENNOSIDES 8.6 MG PO TABS *I*
2.0000 | ORAL_TABLET | Freq: Every evening | ORAL | 0 refills | Status: AC
Start: 2019-09-25 — End: 2019-11-14
  Filled 2019-09-25: qty 100, 50d supply, fill #0

## 2019-09-25 MED ORDER — ASPIRIN 81 MG PO TBEC *I*
81.0000 mg | DELAYED_RELEASE_TABLET | Freq: Two times a day (BID) | ORAL | 0 refills | Status: AC
Start: 2019-09-25 — End: 2019-10-25
  Filled 2019-09-25: qty 60, 30d supply, fill #0

## 2019-09-25 NOTE — Anesthesia Postprocedure Evaluation (Signed)
Anesthesia Post-Op Note    Patient: Timothy Cross    Procedure(s) Performed:  Procedure Summary  Date:  09/24/2019 Anesthesia Start: 09/24/2019  8:11 AM Anesthesia Stop: 09/24/2019 10:10 AM Room / Location:  [id number] / [place] MAIN OR   Procedure(s):  RIGHT ARTHROPLASTY, HIP, TOTAL  Diagnosis:  Osteoarthritis of right hip [M16.11] Surgeon(s):  Arley Phenix, MD  Zachary George, MD Responsible Anesthesia Provider:  Simonne Come, MD         Recovery Vitals  BP: 101/57 (09/25/2019  8:07 AM)  Heart Rate: 68 (09/25/2019  8:07 AM)  Heart Rate (via Pulse Ox): 56 (09/24/2019 11:30 AM)  Resp: 18 (09/25/2019  8:07 AM)  Temp: 35.9 C (96.6 F) (09/25/2019  8:07 AM)  SpO2: 97 % (09/25/2019  8:07 AM)   0-10 Scale: 2 (09/25/2019  9:30 AM)    Anesthesia type:  general  Complications Noted During Procedure or in PACU:  None   Comment:    Patient Location:  Med Surgical Floor  Level of Consciousness:    Recovered to baseline, awake, alert and oriented  Patient Participation:     Able to participate  Temperature Status:    Normothermic  Oxygen Saturation:    Within patient's normal range  Cardiac Status:   Within patient's normal range and stable  Fluid Status:    Stable  Airway Patency:     Yes  Pulmonary Status:    Baseline  Neuraxial Block Evaluation:    No residual motor or sensory symptoms  Pain Management:    Satisfactory to patient and adequate analgesia  Nausea and Vomiting:  None    Post Op Assessment:    Tolerated procedure well and no evidence of recall  Attending Attestation:  All indicated post anesthesia care provided         Complications Noted During Recovery Period:      None  Recovery from Anesthesia:      Recovered to baseline level of consciousness and No residual motor or sensory symptoms following neuraxial anesthesia  Condition of patient:      Satisfactory

## 2019-09-25 NOTE — Progress Notes (Addendum)
JAFFAR RIEDMAN  [id number]    Orthopaedic Progress Note    S: Doing well. No nausea/vomiting. Pain well controlled.  No neurological symptoms. Worked with PT, ambulated, voiding.    PE:  Temp:  [36.3 C (97.3 F)-36.9 C (98.4 F)] 36.3 C (97.3 F)  Heart Rate:  [53-65] 62  Resp:  [10-20] 16  BP: (84-134)/(41-71) 118/70  NAD, Awake, alert, cooperative.  RLE: Dressing C/D/I   Foot WWP, DP 2+, Cap refill <2sec   SILT over 1st DWS/medial/lateral/plantar/dorsal foot   + DF/PF/EHL   Calf soft, nontender     Recent Labs   Lab 09/24/19  1640   Hemoglobin 11.9*   Hematocrit 37*   Sodium 137   Potassium 3.9   Chloride 101   CO2 25   UN 22*   Creatinine 0.98   Glucose 144*       A/P 59 y.o. male POD#1 s/p right THA  - Hct 37, will follow.   - Pain Control  - DVT Prophylaxis: ASA  - PT/OT  - No Hip Precautions  - Weight-bearing: WBAT RLE  - Ice/Elevate   - Imaging reviewed  - Bowel Regimen   - Dispo: pending PT clearance, pain control, likely home today    Zachary George, MD  PGY-5  09/25/2019  6:40 AM     Patient seen and evaluated this AM, resident note reviewed, and I agree with the above findings & treatment plan.     Arley Phenix, MD 09/25/2019 1:12 PM

## 2019-09-25 NOTE — Plan of Care (Signed)
Patient pain well controlled on current regimen not including oxycodone. Patient would like a few oxycodone at discharge in case of pain escalation at home due to physical therapy etc.     Augustin Schooling, PA

## 2019-09-25 NOTE — Progress Notes (Signed)
Occupational Therapy Evaluation    Initial Eval Completed.  Patient is a 59 y.o.male who presents s/p R THA 09/24/2019. Cleared by OT    Discharge recommendation:  Prior Living Environment   Equipment recommendations: Reacher, Sock aid, long handled [facility] Stay Recommendations for nursing: SBA with RW to bathroom, encourage independence with ADLs    Past Medical History:   Diagnosis Date    Anxiety     Arthritis     hip    Cancer     Colon polyp     Osteoarthritis of right hip 08/25/2019       Past Surgical History:   Procedure Laterality Date    HERNIA REPAIR      INGUINAL HERNIA REPAIR      KNEE SURGERY Bilateral     arthroscopy ACL reconstruction    PR LAP, VENTRAL HERNIA REPAIR,REDUCIBLE N/A 10/29/2016    Procedure:  UMBILICAL HERNIA REPAIR;  Surgeon: Suszanne Finch, MD;  Location: [facility] MAIN OR;  Service: General    PR [place] Bilateral 10/29/2016    Procedure:  LAPAROSCOPIC BILATERAL  INGUINAL HERNIA REPAIR with mesh;  Surgeon: Suszanne Finch, MD;  Location: [facility] MAIN OR;  Service: General    PR [place] Left 01/29/2019    Procedure: LAPAROSCOPIC REPAIR WITH MESH, HERNIA, LEFT INGUINAL, ROBOT-ASSISTED;  Surgeon: Suszanne Finch, MD;  Location: [facility] MAIN OR;  Service: General    PR TOTAL HIP ARTHROPLASTY Right 09/24/2019    Procedure: RIGHT ARTHROPLASTY, HIP, TOTAL ;  Surgeon: Arley Phenix, MD;  Location: [facility] MAIN OR;  Service: Orthopedics    UMBILICAL [place]      Surgery Of Male Genitalia Vasectomy Conversion Data        *Bold Indicates co-morbidities affecting treatment and recovery    Occupational profile relating to the present problem:   Lives alone    In addition to the PMH and surgical history listed above, the comorbidities affecting treatment/recovery:   none noted      Performance deficits that result in activity limitations and/or performance restrictions: none      Modification of tasks needed or assistance with  assessments necessary to enable completion of evaluation component: MOD I with AE    Patient complexity:  low level as indicated by  personal factors, environmental factors and comorbidities in addition to their impairments found on physical exam.    [facility]) Occupational Therapy Assessment - 09/25/19 0816        OT Tracking    OT Tracking  OT Discontinue     Type of Session  evaluation with treatment     SW Request?  No        OT Last Visit    Visit (#) of Five  0        Precautions    Precautions used  Yes     Total Hip Replacement  NO HIP PRECAUTIONS    with elevated toilet seat     Weight Bearing Status  Right Lower Extremity - Weight bearing as tolerated     Fall Precautions  General falls precautions     LDA Observation  None     Patient Wearing Mask  Yes     Writer wearing PPE including  Gloves;Shield;Mask        Home Living (Prior to Admission)    Prior Living Situation  Reported by patient;Obtained via chart review  Type of Home  [facility]     Location of Bedroom  First floor     Location of Bathroom  First floor     # Steps to [place]  3     # Of Steps In Home  0     Bathroom Shower/Tub  Tub/Shower unit     Bathroom Equipment  Tub transfer Ecologist in [place] Accessibility  Accessible via walker        Prior Function    Prior Function  Reported by patient;Obtained during chart review     Level of Independence  Independent with ADLs;Independent with ADL functional transfers;Independent ambulation;Independent with homemaking with ambulation;Driving     Lives With  Alone     Receives Help From  Independent    SIL will be "on call"    IADL  Independent     Vocational  Retired        Pain Assessment    *Is the patient currently in pain?  Yes     Revised FLACC - Face  No particular expression or smile     Revised FLACC - Legs  Normal position or relaxed, usual tone & motion to limbs     Revised FLACC - Activity  Lying quietly, normal position, moves easily, regular,  rhythmic respirations     Revised FLACC - Cry  No cry (awake or asleep)     Revised FLACC - Consolability  Content, relaxed     Revised FLACC Score  0     Pain Location 1  Hip     Orientation of Pain 1  Right     Pain Scale 1  1     Pain 1  During     Pain Intervention(s) 1  Cold applied;Repositioned;Refer to nursing for pain management        Cognition    Cognition  No deficit noted        Perception    Perception  No deficit noted        Sensation    Sensation  No apparent deficit        UE Assessment    Additional Comments  BUE ROM and strength WFL during ADLs        Bed Mobility    Supine to Sit  Independent;HOB flat        Functional Transfers    Sit to Stand  Independent     Stand to Sit  Independent     Toilet Transfers  Modified Independent    over toilet commode    Additional Comments  Patient ambulated within room/bathroom with RW for support and MOD I.        Balance    Sitting - Static  Independent      Sitting - Dynamic  Independent     Standing - Static  Independent     Standing - Dynamic  Independent     Standing Tolerance during Functional Task  good     Additional Comments  no LOB during session, demonstrats good standing balance during ADLs at sink        ADL Assessment    Grooming  Independent     UE Dressing  Independent     LE Dressing  Modified independent     Where  LE Dressing Assessed  Edge of bed;Standing     Assist Needed With:  Setup;Increased time;Use of adaptive  Sports administrator Used  Reacher;Sock aid;Walker     Additional Comments  OT educated patient on use of AE for LB dressing after inability to complete without AE. Patient demonstrates understanding by donning underwear and pants with reacher, doffing socks with reacher, donning TEDs and socks with sock aide.         Activity Tolerance    Endurance  Endurance does not limit participation in activity     Additional Comments  no SOB or fatigue        Additional Comments    Additional Comments  cleared by OT, Educated on AE for  ADLs, no hip precautions except raised toilet seat, WBAT, TEDs.         OT Treatment Assessment    Assessment  D/C acute OT        Plan    OT Frequency  One-time visit     No acute OT needs  Independent with ADLs;At baseline function;Pt demonstrates adequate ADL skills for return to prior living environment;No acute OT goals identified        Recommendation    OT Discharge Recommendations  Prior Living Environment     OT Discharge Equipment Recommended  Reacher;Sock aid;long handled [facility] Stay Recommendations  SBA with RW to bathroom, encourage independence with ADLs     Next Visit  0        Multidisciplinary Communication    Multidisciplinary Communication  PTA, RN, patient,         Time Calculation    OT Timed Codes  12     OT Untimed Codes  12     OT Unbilled Time  0     OT Total Treatment  24        Plan and Onset date    Plan of Care Date  09/25/19     Onset Date  09/24/19     Treatment Start Date  09/25/19         Melanee Left OTRL  Pager: [phone]

## 2019-09-25 NOTE — Progress Notes (Signed)
PRE-OPERATIVE JOINT REPLACEMENT CHECKLIST    SITE: right hip                                      DIAGNOSIS: right hip osteoarthritis    HISTORY:  Description of pain: Dull  Pain Frequency: Continuous  Pain Intensity: 10/10  Onset: > 6 months  Duration: > 6 months                  Aggravating factors: prolonged standing, prolonged ambulation, climbing up and down stairs, getting into and out of a vehicle                            Relieving factors: Rest  Limitation of ADLs: Yes, difficulty with grooming, work, homemaking, placing shoes on and off, and leisure activities  Safety Issues (falls): no   Contraindication to non-surgical treatments:no   Failed non-surgical treatments:     Medications/NSAIDs: Yes    Weight loss: Yes           Physical therapy: Yes     Activity modification program: > 6 months    Assisted ambulatory device: yes   Intra-articular injection: No, patient refused   Braces/orthotics: No, not indicated    PHYSICAL EXAM OPERATIVE EXTREMITY:  Gait Description: Antalgic, trendelenberg  Hip ROM:    Flexion: 120 degrees   Internal Rotation: 10 degrees   External rotation: 20 degrees  Greater trochanteric tenderness: None  Limb Length: Right leg shortening  Alignment: Neutral  Crepitus: None  Effusions: Unable to determine clinically     RADIOGRAPHS/INVESTIGATIONS: Radiographs demonstrate: right hip arthrosis - severe    RATIONALE FOR DEVIATING FROM A STEPPED-CARE APPROACH: none     Arley Phenix, MD 09/25/2019 1:01 PM

## 2019-09-25 NOTE — Progress Notes (Signed)
Patient has cleared PT, OT, is voiding spontaneously, and has had x-ray completed.  Patient has achieved adequate pain control and is amenable to discharge.  Discharge instructions reviewed with patient and patient stated understanding.  IV removed.  Patient discharged to home as per order. Denetra Formoso G Delmar Arriaga, RN

## 2019-09-25 NOTE — Discharge Summary (Signed)
Name: BRITTIAN BORCHERT MRN: [id number] DOB: 1961/02/27     Admit Date: 09/24/2019   Date of Discharge: 09/25/2019     Patient was accepted for discharge to   [facility] [6]           Discharge Attending Physician: Arley Phenix, MD      Hospitalization Summary    CONCISE NARRATIVE: [name] was admitted for elective arthroplasty. Their post operative course was without significant events. Patient was discharged to home.          OR PROCEDURE: Right Total Hip Arthroplasty 09/24/2019              SIGNIFICANT MED CHANGES: Yes  Chemical DVT prophylaxis was initiated. Detailed in [facility]. Analgesics tailored to tolerance and adequate pain management.          Signed: Augustin Schooling, PA  On: 09/25/2019  at: 9:51 AM

## 2019-09-25 NOTE — Plan of Care (Signed)
Medical record reviewed for home care, met with pt, home today, walker from [facility] home dme has been delivered  Wallie Char, RN [facility] home care coordinator

## 2019-09-25 NOTE — Plan of Care (Signed)
Chart reviewed and social work spoke with patient at bedside.  Patient plans on discharge to home with home care services from [facility].  Patient's sister-in-law to assist patient at home and provide transportation.  A rolling walker has been delivered to the room.  No further social work intervention necessary at this time unless initiated by patient and/or medical staff.    Sherrilyn Rist, [place]  Pager [phone]  09/25/2019  9:58 AM

## 2019-09-25 NOTE — Progress Notes (Signed)
Physical Therapy    Treatment session completed.  Cleared for home d/c      Discharge recommendation:  Intermittent supervision/assist, Home PT, Anticipate return to prior living arrangement  Equipment recommendations upon discharge: Rolling Walker  Mobility recommendations for nursing while in hospital: 1A with the rolling walker    PT Adult Assessment - 09/25/19 0857        PT Tracking    PT TRACKING  PT Assigned     Type of Session  follow up/treatment     SW Request?  No        Treatment Day    Treatment Day  2        Precautions/Observations    Precautions used  Yes     Total Hip Replacement  Surgical Approach: Posterior lateral;No hip precautions per MD order;Elevated toilet seat     Weight Bearing Status  Right Lower Extremity - weight bearing as tolerated     LDA Observation  None     Isolation Precautions  Eye Protection     Was patient wearing a mask?  Yes     PPE worn by Warehouse manager;Face Shield;Mask     Fall Precautions  Patient educated to use call light for nursing assist prior to getting up;General falls precautions;White board updated with appropriate mobility status        Pain Assessment    *Is the patient currently in pain?  Yes     Pain (Before,During, After) Therapy  Before;During;After     0-10 Scale  1;2     Pain Location  Hip     Pain Orientation  Right     Pain Descriptors  Sore     Pain Intervention(s)  Cold applied;Repositioned;Refer to nursing for pain management        Vision     Current Vision  Wears corrective lenses        Cognition    Cognition  Tested     Arousal/Alertness  Appropriate responses to stimuli     Orientation  Alert and oriented x3     Following Commands  Follows simple commands without difficulty        Bed Mobility    Bed mobility  Not tested     Additional comments  Patient reports he just practiced per home setup with OT        Transfers    Transfers  Tested     Sit to Stand  Modified independent (device)     Stand to sit  Modified independent (device)      Transfer Assistive Device  rolling walker        Mobility    Mobility  Tested     Weight Bearing Status  RLE     Weight Bearing Status RLE  Right Lower Extremity - weight bearing as tolerated     Gait Pattern  2 point;Decreased cadence;Decreased R step length;Decreased R step height;Decreased L step length;Decreased L step height     Ambulation Assist  Modified independent (device)     Ambulation Distance (Feet)  400     Ambulation Assistive Device  rolling walker     Stairs Assistance  Modified independent (device)     Stair Management Technique  One rail;Step to pattern;With cane;Forwards     Number of Stairs  4     Additional comments  Patient with good carryover from yesterday's therapy session. Good sequencing of gait and stairs. Cues for head up and good posture.  Walker adjusted to patient's preference. Education on height of assistive devices        Family/Caregiver Training`    Patient/Family/Caregiver training  Yes     Patient training  Role of physical therapy in hospital and plan for evaluation and follow up;Post-operative precautions;Discharge planning;PT plan of care after evaluation;Purpose and importance of icing and recommendations for ice frequency and duration;Use of assistive device;Equipment recommendations for discharge;Benefits of oob activity and mobility during hospitalization, including frequency of ambulation and change of position;Call don't fall, purpose of bed/chair alarm, and recommendation for nursing assistance during all oob mobility        Balance    Balance  Tested     Sitting - Static  Independent      Standing - Static  Independent;Supported     Standing - Dynamic  Independent;Supported        PT AM-PAC Mobility    Turning over in bed?  4     Sitting down on and standing up from a chair with arms?  4     Moving from lying on back to sitting on the side of the bed?  4     Moving to and from a bed to a chair?  4     Need to walk in hospital room?  4     Climbing 3 - 5 steps with a  railing?  4     Total Raw Score  24        Gait Speed    Assistive Device  rolling walker     Distance Walked (meters)  3.05 m     Time it took to walk distance (seconds)  6.76 sec     Gait speed (m/sec)  0.45 m/sec        Additional Comments    Additional comments  Patient standing unsupported and voiding 433ml in urinal independently         Assessment    Brief Assessment  Patient demonstrates adequate mobility skills to return home     Problem List  Impaired LE ROM;Impaired LE strength     Patient / Family Goal  home today        Plan/Recommendation    Treatment Interventions  Restorative PT;Assess functional mobility     PT Frequency  5-7x/wk     Mobility Recommendations  1A with the rolling walker     Referral Recommendations  OT;SW     Discharge Recommendations  Intermittent supervision/assist;Home PT;Anticipate return to prior living arrangement     PT Discharge Equipment Recommended  Rolling Walker     Assessment/Recommendations Reviewed With:  Patient;Nursing     Next PT Visit  cleared         Time Calculation    PT Timed Codes  23     PT Untimed Codes  0     PT Unbilled Time  0     PT Total Treatment  23        Plan and Onset date    Plan of Care Date  09/24/19     Onset Date  09/24/19     Treatment Start Date  09/24/19         AMPAC Score Interpretation:  Adapted from Worthy Flank, et al Association of AM-PAC 6 Clicks Basic Mobility and Daily Activity Scores with discharge destination, 2021  AM-PAC Raw Score of > 16 (standardized score > 40.78, % impairment > 54%) indicates a high likelihood of discharge to home  Adapted from Corwin Levins al Prediction of disposition within 48-hours of hospital admission using patient mobility scores, 2019  AM-PAC raw score  ? 15 (standardized score ? 39, % impairment ? 58%) indicates a high probability of a discharge to home  AM-PAC raw score 9-14 (standardized score ? 31, % impairment 81%-61%) and age < 45 is more likely to discharge to home  AM-PAC raw score 9-14  (standardized score ? 31, % impairment 81%-61%) and age > 51 is more likely to require post-acute care  AM-PAC raw score < 9 (standardized score < 31, % impairment < 81%) indicates pt is more likely to require post-acute care    Gait Speed Score Interpretation:  Toney Reil, Marzetta Board PT, PhD; Beather Arbour PT, PhD. Walking Speed: the Sixth Vital Sign." Journal of Geriatric Physical Therapy 32. 2 (2009) : 2-9.)   0 m/s -0.4 m/s: Household walker; Needs intervention to reduce fall risk; Dependent with ADL's and IADL's   0.4 m/s -0.6 m/s: Limited Hydrographic surveyor; Needs intervention to reduce fall risk   0.6 m/s -1.0 m/s: [employer]; Needs intervention to reduce fall risk   m/s- 1.4 m/s: Normal Walking speed, Cross the [employer], Less likely to have adverse events.         [place], [place]

## 2019-09-25 NOTE — Plan of Care (Signed)
Problem: Infection Prevention  Goal: Patient is knowledgeable of measures to prevent infection  Outcome: Maintaining     Problem: Hemodynamic Stability (joint)  Goal: Maintain Hemodynamic Stability (joint)  Outcome: Maintaining     Problem: Pain/comfort (joint)  Goal: Patient's pain or discomfort is manageable (joint)  Outcome: Maintaining

## 2019-09-25 NOTE — Progress Notes (Signed)
Current state of emergency related to COVID-19. Documentation completed per hospital emergency response standard.

## 2019-09-25 NOTE — Op Note (Signed)
Date of Surgery: 09/24/18  Surgeon: Arley Phenix, MD   First Assistant: Mackey Birchwood, MD PGY V  Second Assistant: R. Balinda Quails, RNFA     Pre-Op Diagnosis:   Right hip DJD     Anesthesia Type:   Spinal, General     Post-Op Diagnosis:   Same as Preop    Procedure(s) Performed :   Right THA     Approach:   Posterior     Intraoperative Findings:   1. End stage right hip arthrosis   2. Acetabular retroversion    Implants:   1. [employer] G7 acetabular shell size 56 mm   2. [employer] 6.5 mm transacetabular screws x 2   3. [employer] ARCOMXL polyethylene liner   4. [employer] taperloc complete size 15, High Offset  5. [employer] Ceramic head size 36 mm, STD taper    Estimated Blood Loss: 200  Specimens to Pathology: Yes  Patient Condition: good     Indications:   Timothy Cross is a 59 y.o.  male with end stage right hip arthrosis refractory to conservative treatment, who elected to receive right total hip arthroplasty.     Description of Procedure:   POPE BYRAM was identified in the preoperative holding area and the intended surgical site was marked with the initials "JG". Further discussion of the risks related to this procedure were discussed with the patient at bedside. These risks include but are not limited to infection, bleeding requiring blood transfusion, nerve injury resulting foot drop, iatrogenic femur/acetabular fracture, inadvertent limb lengthening or shortening, hip dislocation, post-operative stiffness, need for further surgery, risks of anesthesia, blood clots, risk of exacerbation of underlying medical conditions resulting in heart attack, stroke, respiratory failure, and even death. Rush Landmark understood these risks and elected to proceed. The patient was escorted to the operative suite, and was initially placed supine on the operating room table. Following induction of anesthesia, a foley catheter was placed, and the patient was placed in the lateral decubitus position. All bony  prominences were well padded and the pelvis secured in a [place] hip positioner. The patient was then prepped and draped in the normal sterile fashion. Following the standard pre-surgical pause and administration of pre-operative antibiotics a longitudinal incision was made centered over the greater trochanter. Dissection was carried sharply down to the level of the gluteus fascia through skin and subcutaneous tissues. The gluteus fascia was incised, the maximus muscle divided in line with fibers bluntly, and the Charnley retractor was placed. The piriformis gluteus minimus interval was identified, and curved retractor placed deep to the gluteus minums. A full-thickness capsulotendinous flap was raised posteriorly, and 2 Ethibond tag stitches were placed through capsule and piriformis as well as conjoint tendon respectively. A limb length Steinman pin was inserted into the ilium. The femur was marked with the Bovie at the lateral trochanteric ridge, and the pin Bovie mark distance was measured. The femoral head was then disarticulated, and the level of our femoral neck osteotomy was marked according to preoperative template. The femoral osteotomy was made with an oscillating saw. The femoral head was removed. Anterior capsule was resected.  Our attention was turned to acetabular exposure. Utilizing an anterior retractor as well as strategically placed Steinmann pins the acetabulum was exposed. The acetabular labrum was sharply excised and the acetabular fossa was cleared of soft tissue. The resected femoral head was measured, and a starting reamer was utilized to ream in a medial direction. Sequential acetabular reaming took  place until a size suitable for a 1 mm press-fit of the implant listed above. Internal landmarks confirmed acetabular position (ischium, TAL, anterior column, & lateral most aspect of acetabular rim). The final acetabular component listed above was impacted into position. Trial liner placed.    Our attention then turned to the preparation of the femoral canal. A box osteotome was utilized to remove lateral neck remnant. Charnley awl was utilized to enter the femoral canal distally, and a lateralizing reamer was inserted to the level of the greater trochanter. The femur was sequentially broached with Taperloc broaches, alternating with suction of the canal. Version of broaching referencing native version (posterior femoral neck, medial calcar). The final broach was noted to be longitudinally and rotationally stable and was left in place for trialing. High offset neck option was chosen with STD head selection. The hip was reduced and combined version was noted to low. Leg length was appropriate based on direct measurement and soft tissue tension. The broach was removed, along with trial liner, the cup was repositioned into additional anteversion. The # 15 broach placed, hip trialed with 36 mm STD head, combine version now excellent. Anterior acetabular bone including portion of AIIS was removed to avoid impingement. The hip was now stable in position of flexion (45, 90, & > 90 degrees)/adduction/and internal rotation, as well as extension and external rotation. The leg lengths remained satisfactory by palpattion of the knees and measurement from reference pin.  Broach removed, trial liner removed. Transverse acetabular screws listed above were inserted to augment fixation in the standard fashion. Drill holes were made, followed by depth gauge measurement of screw insertion. The final polyethylene liner was impacted into position.  The final stem selection list above was impacted into place. Femoral head selections were re-trialed off the final stem. The final head selection listed above was impacted onto a clean dry taper. The final implants achieved appropriate leg length as measured from our reference pin, as well as optimal stability in flexion (45, 90, & > 90 degrees)/adduction/and internal rotation,  extension and external rotation, as well as flexion, abduction, internal rotation (GT impingement). Soft tissue balance was deemed appropriate by longitudinal shuck test, and dropkick test. The wound was copiously irrigated with pulse lavage. An augmented posterior capsular closure was performed by passing the previously placed tag sutures through drill holes in the posterior greater trochanter. The hip was soaked with sterile betadine. The sutures were tied over the greater trochanter. Additional interrupted suture were placed through the proximal capsule and gluteus minimus.  Tissue was locally infiltrated with 60 cc of .25% marcaine with epinephrine & Toradol. The remainder of the wound was closed in layers with #2 quil for the fascial closure, 0 Vicryl for deep subcutaneous tissue, and 2-0 quil, and 3-0 monocryl for skin closure. The skin was then sealed with dermabond and a sterile dressing was placed. The patient was weaned from his anesthesia and taken to the PACU in stable condition. There were no complications.     Infection Prevention Measurers:  SA/MRSA Screening  Antibiotics  Foot/toe covering  CHG clear Pre scrub  Positive Pressure Suiting with sleeves ioban sealed  CHG orange Prep  4 layers sterile draping including 1 impervious layer  CHG reprep following draping  Glove change prior to incision  Ioban layer  Efficient surgery time  Glove change prior to implant insertion  Suction tip change prior to implant insertion  Sterile betadine soak following implant insertion  Dermabond occlusive dressing  Signed: Arley Phenix, MD 09/25/2019 1:02 PM

## 2019-09-25 NOTE — Discharge Instructions (Signed)
Surgical date: 09/24/2019 PR TOTAL HIP ARTHROPLASTY [27130] (ARTHROPLASTY, HIP, TOTAL)     Thank you for choosing [facility] for your joint replacement.  Please adhere to the following instructions in order to maximize your recovery.    Please follow the instructions next to any check box that has been checked i.e. [x]     Activity:   You should be Weight bearing as tolerated on your operative extremity. Walk and stair-climb as tolerated.    Use assistive devices (walker, crutches, cane) as instructed by your Physical Therapist.    Do not lift, carry, push or pull objects greater than 10 pounds until cleared by your surgeon.    Driving is permitted once you are functionally cleared to do so by your surgeon AND you are no longer taking narcotic pain medications.          [x]  You have NO hip precautions.   []  You have posterior hip precautions:     o Do no cross your legs at the knee or ankle.   o Do not bend at the waist past 90 degrees.   o Do not rotate your knee or foot inward.  [x]  Please use an elevated toilet seat for six weeks after surgery.    Diet   You may resume your previous diet, as tolerated.    Please aim for protein intake of approximately 100 grams per day to promote healing. (Meats, fish, peanut butter, and ensure/boost shakes are all good sources of protein.)   If you feel nauseous or develop vomiting, go back to just clear liquids, increasing to a soft diet and then regular solid foods as tolerated.   Smoking and/or nicotine use are associated with poor wound healing and increased risk of infection, therefore, it is recommended you not smoke or use nicotine for as long as possible following-surgery.   If you are diabetic, it is important to keep your blood sugars well-controlled, poorly controlled blood sugars are associated with poor wound healing and increased risk of infection.     Labs  [x]    None required.  []    Your *** should be rechecked on ***. Results  will be sent to your surgeons office as well as your PCP.    []    Your blood thinner requires you have labs drawn (PT/INR) twice weekly. (Ideally every Monday and Thursday.) Results will be sent to your surgeons office as well as your PCP.     Preventing Blood Clots (Deep Vein Thrombosis/Pulmonary Embolism or DVT/PE):   Please wear your anti-embolism stockings as directed by your surgeon.     You will be prescribed a blood thinner to help minimize the increased risk of blood clots following surgery. Please refer to your MEDICATION LIST for details. While taking blood thinners, you may find you bruise or develop nose bleeds more easily. You should watch for signs of gastrointestinal bleeding, which may include dark, tar-like stools or vomit with the appearance of coffee-grounds. You should call your surgeon if you notice these signs.   You have been instructed to take Aspirin 81mg  twice daily for 4 weeks for deep vein thrombosis prophylaxis (blood clot prevention).   []   Your blood thinner requires you to minimize your Vitamin K intake.  Many multivitamins, as well as green, leafy vegetables, contain Vitamin K.    Managing your Pain:   Please refer to your MEDICATION LIST for detailed instructions on your personalized pain protocol.   You should apply ice  for 30 minutes every 1-2 hours and elevate your extremity above the level of your heart 3 x day and during rest to help reduce swelling and pain.   Narcotic medications are often necessary for a short time after total joint surgery. While you are taking narcotics:  o Do NOT drive or operate heavy machinery.  o Do NOT drink alcohol.  o Do NOT make any important personal, business, or economic decisions or sign legal documents.  o Narcotics can cause side effects such as sedation, lethargy, body wide itching, and constipation.  o To prevent/alleviate constipation, you should walk as much as you can tolerate and drink plenty of fluids. You should also use a  stool softener such as Colace (docusate sodium) and a laxative such as Senokot (senna) unless you develop diarrhea or loose stool.  These two medications have been prescribed to you.  See your MEDICATION LIST for details.    - If you have not had a bowel movement by your 3rd day after surgery please consider adding miralax, or taking magnesium citrate to help facilitate a bowel movement. Utilize your pharmacist, or please call your PCP/surgeon's office, if you have any concerns about the addition of these medications to your current home regime   You should work to gradually wean off of narcotic pain medications as your pain improves, first by decreasing the dose, and then by increasing the amount of time in between doses.     It is important to dispose of all narcotics when you are certain you no longer need them. The first choice for all drug disposal is an authorized medication disposal bin which are located at the [place], most retail pharmacies, and law enforcement stations. If you are unable to access one of these bins, you should flush un-used narcotics down the toilet.    Caring for your incision:   Your incision is covered with a surgical dressing.  Leave this dressing in place as long as it remains adhered. Likely, it will remain adhered until the time of your follow up appointment. After two weeks your dressing may be removed. After two weeks if your incision is dry it may be left open to air and you may shower   If you have any drainage from your incision, let your surgeons office know. Do not shower if you have drainage   If you have no drainage you may shower with intact dressing in place. If theres any sign that the dressing is peeling at any corner, or wrinkled, cover with plastic wrap before showering. Do not soak your wound in a bath, whirlpool, or in any kind of standing water.   If your dressing becomes soiled or wet, remove and start daily dressing changes.    You do  not have any surgical staples or sutures that require removal      Follow-up:   Call your surgeons office at [phone] to make a follow-up appointment.     You or your caregiver should call the surgeons office at [phone] (or [phone] after business hours) for:   Redness around incisions   Continuous drainage or bleeding from incisions (a small amount of drainage is expected initially)   Temperature above 101 F   Increasing swelling, numbness, or pain not controlled by your pain medications   Any other worrisome condition  There is always someone on call, 24/7 to address your concerns.  Please dont hesitate to reach out with your questions. In the event that you  are unable to contact the office or the answering service, please go to [facility] Emergency Department for evaluation.    Your surgeon requests that you do not receive the influenza vaccine until 2 weeks have passed since your surgical date

## 2019-09-25 NOTE — Plan of Care (Signed)
Problem: Infection Prevention  Goal: Patient is knowledgeable of measures to prevent infection  Outcome: Patient discharged  Goal: Patient will remain free from symptoms of infection-post op  Outcome: Patient discharged     Problem: Hemodynamic Stability (joint)  Goal: Maintain Hemodynamic Stability (joint)  Outcome: Patient discharged     Problem: Pain/comfort (joint)  Goal: Patient's pain or discomfort is manageable (joint)  Outcome: Patient discharged     Problem: Nutrition (joint)  Goal: Patient's nutritional status is maintained or improved (join  Outcome: Patient discharged     Problem: Impaired Mobility  Goal: Patient's mobility is maintained or improved  Outcome: Patient discharged     Problem: Mobility for Total Hip Arthroplasty  Goal: Patient's mobility and activity restrictions are in accordan  Outcome: Patient discharged     Problem: Potential for impaired Circulation, Motor, or Sensation  Goal: Patient's CMS status is maintained or improved from baseline  Outcome: Patient discharged     Problem: Impaired Balance  Goal: Patient will maintain balance to allow for safe mobility and  Outcome: Patient discharged     Problem: Bowel Elimination post surgical  Goal: Elimination pattern is normal or improving  Outcome: Patient discharged     Problem: Bladder Elimination Post-Surgical (joint)  Goal: Patient is able to empty bladder or return to baseline (join  Outcome: Patient discharged     Problem: Safety  Goal: Patient will remain free of falls  Outcome: Patient discharged  Goal: Prevent any intentional injury  Outcome: Patient discharged     Problem: Cognitive function  Goal: Cognitive function will be maintained or return to baseline  Outcome: Patient discharged

## 2019-09-28 ENCOUNTER — Telehealth: Payer: Self-pay

## 2019-09-28 NOTE — Telephone Encounter (Signed)
Nurse Navigator initial discharge follow up phone call              Concerns voiced    Eating/drinking   Yes                ?drinking additional 20-30 gms of protein Yes  Any prior to OR medication changes post op ?  Ie cardiac meds  No   Bowel habits       BM?  Yes          Passing gasYes    Bowel regime   Yes  Pain    In last 24 hours  Level range LEAST  0 Worst  3    Pain Meds and frequency Taking per direction of surgeon  Wound  Incision has any drainage ;No    Swelling  Using Ice Yes         Three times per day, legs above heart for 20 minutes elevating   Anticoagulant ASA 81 mg BID X 28 daysYes         Labs being drawn N/A  Wearing TEDS Yes     x 28 days      ACE No      Surgical side placement  prn for swelling reduction  Dr. Charletta Cousin wants on daily for 2 weeks    Home RN/PT/OT visiting Yes  Doing excercises Yes     Walking Yes every hour when awake.   Pain Management  Bowel Management    Dressing Management Yes    .  Date of Next Appt:  10/13/2019  ZOOM Luther Parody Yes                         If any acute issues arise please contact your surgeons office and chose # 3 option and leave a messager  After hours call surgeons office and listen to the end of the message-they will give you the number of the on call resident to speak with  Gerilyn Pilgrim, RN

## 2019-09-30 ENCOUNTER — Encounter: Payer: Self-pay | Admitting: Orthopedic Surgery

## 2019-10-01 ENCOUNTER — Encounter: Payer: Self-pay | Admitting: Orthopedic Surgery

## 2019-10-01 ENCOUNTER — Encounter: Payer: Self-pay | Admitting: Primary Care

## 2019-10-01 NOTE — Telephone Encounter (Signed)
This patient attachment is clinically relevant.  Please keep in the patient's chart.    []  Document  [x]  Photo    Brief attachment description: Arm lesion  (Ex. L forearm rash, WC papers)    Thank you,  Lonie Peak, MD

## 2019-10-02 LAB — SURGICAL PATHOLOGY

## 2019-10-06 ENCOUNTER — Ambulatory Visit: Payer: 59 | Attending: Internal Medicine

## 2019-10-06 ENCOUNTER — Encounter: Payer: Self-pay | Admitting: Gastroenterology

## 2019-10-06 DIAGNOSIS — Z23 Encounter for immunization: Secondary | ICD-10-CM

## 2019-10-06 NOTE — Progress Notes (Signed)
   Covid-19 Vaccination Clinic  Name:  Jeremiah Weaver    MRN: [id number] DOB: [birth year]  10/06/2019  [name] was observed post Covid-19 immunization for 15 minutes without incident. He was provided with Vaccine Information Sheet and instruction to access the V-Safe system.   [name] was instructed to call 911 with any severe reactions post vaccine: Marland Kitchen Difficulty breathing  . Swelling of face and throat  . A fast heartbeat  . A bad rash all over body  . Dizziness and weakness   Immunizations Administered    Name Date Dose VIS Date Route   Pfizer COVID-19 Vaccine 10/06/2019 11:38 AM 0.3 mL 08/05/2018 Intramuscular   Manufacturer: [place]   Lot: [id number]   [place]: [id number]

## 2019-10-11 ENCOUNTER — Encounter: Payer: Self-pay | Admitting: Orthopedic Surgery

## 2019-10-13 ENCOUNTER — Encounter: Payer: Self-pay | Admitting: Gastroenterology

## 2019-10-13 ENCOUNTER — Ambulatory Visit: Payer: 59 | Admitting: Orthopedic Surgery

## 2019-10-13 DIAGNOSIS — Z96641 Presence of right artificial hip joint: Secondary | ICD-10-CM

## 2019-10-13 NOTE — Progress Notes (Signed)
Video Visit     Location of Patient: home    Location of Telemedicine Provider: hospital / clinical location    Other participants in telemedicine encounter and roles:  none    This is an established patient visit.    Reason for visit: Right THR    Patient's problem list, allergies, and medications were reviewed and updated as appropriate.  Please see the EHR for full details.    Consent was obtained from the patient to complete this video visit; including the potential for financial liability.      Raiana Pharris, PA    ADULT POST OP THR    WARN KAJIWARA presents today for a post-op visit following a right total hip arthroplasty performed 09/24/19. Currently patient is doing rehab at home with an HEP.  Patient states pain is1 out of 10.  The patient is ambulating with a cane.  The surgical wound has been dry. Range of motion has been progressing well.  Remains compliant with aspirin twice daily for DVT prophylaxis.  Notes significant hip pain improvement from the preoperative baseline.     Exam: Swelling is mild.  The surgical wound is healing well. There is not redness. There is not drainage. There is not induration.       Radiographs were obtained postoperatively in the hospital and personally reviewed. These show a total hip arthroplasty with normal alignment.      Impression: Right THR-09/24/2019     Plan:   The patient's postoperative hospital x-ray findings and underlying diagnosis reviewed.  He remains compliant with his HEP and is comfortable doing so.  Complete 28-day course of aspirin twice daily for DVT prophylaxis.  May wean the use of a cane as hip comfort and safety allows.  Patient may shower directly on his incision.  Importance of following dental antibiotic prophylaxis precautions reviewed.  Acceptable low-impact exercise activities were once again reviewed.  All ambulatory activities to be performed within the confines of comfort.  Additional questions invited and answered.    Follow -up in 4  weeks.     Orders Next Visit: 2 views right hip x-ray.

## 2019-11-04 ENCOUNTER — Ambulatory Visit: Payer: 59 | Admitting: Orthopedic Surgery

## 2019-11-04 ENCOUNTER — Ambulatory Visit
Admission: RE | Admit: 2019-11-04 | Discharge: 2019-11-04 | Disposition: A | Discharge status: Home / Self Care | Payer: 59 | Source: Ambulatory Visit

## 2019-11-04 VITALS — BP 139/68 | Ht 71.65 in | Wt 151.0 lb

## 2019-11-04 DIAGNOSIS — Z96641 Presence of right artificial hip joint: Secondary | ICD-10-CM

## 2019-11-04 DIAGNOSIS — M25551 Pain in right hip: Secondary | ICD-10-CM

## 2019-11-04 NOTE — Progress Notes (Signed)
Subjective: Timothy Cross is here 6 weeks s/p right THA. Overall the patient is doing well, he reports mild tightness, and anterior thigh pain with activity.    ROS: No chest pain, SOB, fever, chills, skin erythema, all remaining systems negative.    Objective:  Constitutional: NAD, comfortable  Psychiatric: Normal affect  Musculoskeletal:  Soft tissue: Well healed surgical incision, no erythema, no drainage  Gait: Unassisted  Neurocirculatory: compartments soft, foot warm and perfused, ankle plantar and dorsiflexion intact     Radiographs: AP pelvis lateral right hip reviewed demonstrating satisfactory and stable component position.      Impression:  1. S/P Right THA - 6 weeks    Plan:  Doing well.  Will follow up at 6 months for repeat evalaution.    Answers for HPI/ROS submitted by the patient on 11/04/2019  What is your goal for today's visit?: post surgical review  Was this the result of an injury?: No  Is this a work related condition? : No  Current work status: : no work  Fever: No  Chills: No  Numbness: No  Tingling: No

## 2019-11-10 ENCOUNTER — Ambulatory Visit: Payer: PRIVATE HEALTH INSURANCE | Admitting: Orthopedic Surgery

## 2019-12-21 ENCOUNTER — Other Ambulatory Visit: Payer: Self-pay | Admitting: Orthopedic Surgery

## 2019-12-21 ENCOUNTER — Encounter: Payer: Self-pay | Admitting: Orthopedic Surgery

## 2019-12-21 MED ORDER — AMOXICILLIN 500 MG PO CAPS *I*
ORAL_CAPSULE | ORAL | 2 refills | Status: DC
Start: 2019-12-21 — End: 2023-08-02

## 2019-12-29 ENCOUNTER — Ambulatory Visit: Payer: No Typology Code available for payment source | Admitting: Primary Care

## 2019-12-29 ENCOUNTER — Encounter: Payer: Self-pay | Admitting: Primary Care

## 2019-12-29 VITALS — BP 124/78 | Temp 99.0°F | Ht 72.0 in | Wt 150.0 lb

## 2019-12-29 DIAGNOSIS — M5412 Radiculopathy, cervical region: Secondary | ICD-10-CM

## 2019-12-29 HISTORY — DX: Radiculopathy, cervical region: M54.12

## 2019-12-29 NOTE — Progress Notes (Signed)
Subjective:     Patient ID: Timothy Cross is a 59 y.o. male.    Presents for left arm/shoulder pain.     Symptoms noticed for several weeks now. Reports when he holds or turns his neck a certain way, he can induce numbness radiating into his left forearm and tingling sensation in the left hand. He denies any preceding injury. He denies any other musculoskeletal complaints. Symptoms have become persistent. He denies any significant pain.       Timothy Cross  has a past medical history of Anxiety, Arthritis, Cancer, Colon polyp, and Osteoarthritis of right hip (08/25/2019).  Timothy Cross has Hyperlipidemia; Osteoarthritis; ACL tear; Personal history of colonic polyps; Prostate nodule without urinary obstruction; Inguinal hernia bilateral, non-recurrent; Elevated PSA; Prostate cancer; Other specified aftercare following surgery; S/P left inguinal hernia repair; Chronic pain of left knee; Bilateral hip pain; History of prostate cancer; Osteoarthritis of right hip; s/p R THA 09/24/19; and S/P total hip arthroplasty on their problem list.  Timothy Cross has a current medication list which includes the following prescription(s): amoxicillin and tadalafil.  Timothy Cross is allergic to environmental allergies.    Review of Systems   Musculoskeletal: Positive for arthralgias and myalgias. Negative for neck pain and neck stiffness.   Neurological: Positive for numbness.           Objective:   Physical Exam  Vitals reviewed.   Constitutional:       General: He is not in acute distress.     Appearance: Normal appearance.   Musculoskeletal:      Left shoulder: Normal.      Cervical back: Normal range of motion.   Neurological:      Mental Status: He is alert.      Sensory: Sensory deficit present.      Motor: No weakness.      Deep Tendon Reflexes: Reflexes are normal and symmetric.      Comments: Left lateral numbness to light palpation.              Assessment:      Mr. Buller is a 59 year old male presenting with left cervical  radiculopathy.       Plan:      Discussed management with PT. He will call or message with preferred PT location.     Follow-up as dictated above.     Lonie Peak, MD  12/29/2019    *This note was dictated using [place], reasonable efforts were made to correct for any dictation errors.

## 2020-01-01 ENCOUNTER — Encounter: Payer: Self-pay | Admitting: Primary Care

## 2020-01-01 DIAGNOSIS — M5412 Radiculopathy, cervical region: Secondary | ICD-10-CM

## 2020-01-01 NOTE — Telephone Encounter (Signed)
Referral authorized. Notified patient through [employer].

## 2020-02-16 ENCOUNTER — Telehealth: Payer: Self-pay | Admitting: Urology

## 2020-02-16 NOTE — Telephone Encounter (Signed)
Called patient to remind him to get blood work done prior to upcoming appointment.  Patient expressed understanding.

## 2020-02-16 NOTE — Telephone Encounter (Signed)
Copied from [place] [phone]. Topic: Appointments - Reschedule Appointment  >> Feb 16, 2020 12:36 PM [name] A wrote:    [place] A FOLLOW UP    Timothy Cross  calling to reschedule follow up appointment with Dr. Lamar Blinks to be seen for Prostate Ca, 1 yr f/u, Psa before (diagnoses/symptoms).     This appointment has been scheduled for 03/17/2020 3:00.     If patient requesting to cancel/reschedule imaging they need to contact radiology directly.     [place] Patients should be encouraged to reach out to Kevan Rosebush at the [facility] [phone] to cancel/reschedule.

## 2020-02-22 ENCOUNTER — Ambulatory Visit: Payer: No Typology Code available for payment source | Admitting: Urology

## 2020-03-02 ENCOUNTER — Encounter: Payer: Self-pay | Admitting: Urology

## 2020-03-02 DIAGNOSIS — C61 Malignant neoplasm of prostate: Secondary | ICD-10-CM

## 2020-03-03 ENCOUNTER — Other Ambulatory Visit
Admission: RE | Admit: 2020-03-03 | Discharge: 2020-03-03 | Disposition: A | Discharge status: Home / Self Care | Payer: No Typology Code available for payment source | Source: Ambulatory Visit | Attending: Primary Care | Admitting: Primary Care

## 2020-03-03 DIAGNOSIS — Z0000 Encounter for general adult medical examination without abnormal findings: Secondary | ICD-10-CM | POA: Insufficient documentation

## 2020-03-03 DIAGNOSIS — C61 Malignant neoplasm of prostate: Secondary | ICD-10-CM

## 2020-03-03 LAB — CBC AND DIFFERENTIAL
Baso # K/uL: 0 10*3/uL (ref 0.0–0.1)
Basophil %: 0.7 %
Eos # K/uL: 0.4 10*3/uL (ref 0.0–0.5)
Eosinophil %: 7.2 %
Hematocrit: 45 % (ref 40–51)
Hemoglobin: 14.4 g/dL (ref 13.7–17.5)
IMM Granulocytes #: 0 10*3/uL (ref 0.0–0.0)
IMM Granulocytes: 0.4 %
Lymph # K/uL: 1.5 10*3/uL (ref 1.3–3.6)
Lymphocyte %: 27.7 %
MCH: 29 pg (ref 26–32)
MCHC: 32 g/dL (ref 32–37)
MCV: 91 fL (ref 79–92)
Mono # K/uL: 0.6 10*3/uL (ref 0.3–0.8)
Monocyte %: 10.1 %
Neut # K/uL: 3 10*3/uL (ref 1.8–5.4)
Nucl RBC # K/uL: 0 10*3/uL (ref 0.0–0.0)
Nucl RBC %: 0 /100 WBC (ref 0.0–0.2)
Platelets: 175 10*3/uL (ref 150–330)
RBC: 5 MIL/uL (ref 4.6–6.1)
RDW: 14 % (ref 11.6–14.4)
Seg Neut %: 53.9 %
WBC: 5.5 10*3/uL (ref 4.2–9.1)

## 2020-03-03 LAB — LIPID PANEL
Chol/HDL Ratio: 2.6
Cholesterol: 195 mg/dL
HDL: 75 mg/dL — ABNORMAL HIGH (ref 40–60)
LDL Calculated: 109 mg/dL
Non HDL Cholesterol: 120 mg/dL
Triglycerides: 57 mg/dL

## 2020-03-03 LAB — COMPREHENSIVE METABOLIC PANEL
ALT: 12 U/L (ref 0–50)
AST: 15 U/L (ref 0–50)
Albumin: 4.4 g/dL (ref 3.5–5.2)
Alk Phos: 54 U/L (ref 40–130)
Anion Gap: 11 (ref 7–16)
Bilirubin,Total: 0.6 mg/dL (ref 0.0–1.2)
CO2: 27 mmol/L (ref 20–28)
Calcium: 9.5 mg/dL (ref 8.6–10.2)
Chloride: 102 mmol/L (ref 96–108)
Creatinine: 0.96 mg/dL (ref 0.67–1.17)
GFR,Black: 100 *
GFR,Caucasian: 86 *
Glucose: 81 mg/dL (ref 60–99)
Lab: 20 mg/dL (ref 6–20)
Potassium: 4.2 mmol/L (ref 3.3–5.1)
Sodium: 140 mmol/L (ref 133–145)
Total Protein: 6.8 g/dL (ref 6.3–7.7)

## 2020-03-03 LAB — HEMOGLOBIN A1C: Hemoglobin A1C: 5.2 %

## 2020-03-03 LAB — MULTIPLE ORDERING DOCS

## 2020-03-03 LAB — TSH: TSH: 2.02 u[IU]/mL (ref 0.27–4.20)

## 2020-03-03 LAB — PSA (EFF.4-2010): PSA (eff. 4-2010): 0.16 ng/mL (ref 0.00–4.00)

## 2020-03-03 LAB — VITAMIN D: 25-OH Vit Total: 40 ng/mL (ref 30–60)

## 2020-03-03 MED ORDER — TADALAFIL 20 MG PO TABS *I*
20.0000 mg | ORAL_TABLET | Freq: Every day | ORAL | 3 refills | Status: DC | PRN
Start: 2020-03-03 — End: 2021-07-31

## 2020-03-16 ENCOUNTER — Other Ambulatory Visit: Payer: Self-pay | Admitting: Urology

## 2020-03-16 DIAGNOSIS — R3989 Other symptoms and signs involving the genitourinary system: Secondary | ICD-10-CM

## 2020-03-17 ENCOUNTER — Ambulatory Visit: Payer: No Typology Code available for payment source | Admitting: Urology

## 2020-03-17 ENCOUNTER — Encounter: Payer: Self-pay | Admitting: Urology

## 2020-03-17 DIAGNOSIS — N529 Male erectile dysfunction, unspecified: Secondary | ICD-10-CM

## 2020-03-17 DIAGNOSIS — R3989 Other symptoms and signs involving the genitourinary system: Secondary | ICD-10-CM

## 2020-03-17 DIAGNOSIS — C61 Malignant neoplasm of prostate: Secondary | ICD-10-CM

## 2020-03-17 LAB — POCT URINALYSIS DIPSTICK
Blood,UA POCT: NEGATIVE
Glucose,UA POCT: NORMAL mg/dL
Ketones,UA POCT: NEGATIVE mg/dL
Leuk Esterase,UA POCT: NEGATIVE
Lot #: 53600802
Nitrite,UA POCT: NEGATIVE
PH,UA POCT: 5 (ref 5–8)
Protein,UA POCT: NEGATIVE mg/dL
Specific gravity,UA POCT: 1.025 (ref 1.002–1.030)

## 2020-03-17 NOTE — Letter (Signed)
PATIENT NAME:  SHAQUELLE, Timothy Cross  DATE OF SERVICE:    MRN:  [id number]  CSN:  [id number]  Page 2  [address] 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  He underwent seed implant brachytherapy on Oct 17, 2017, with 125 iodine seeds.  He has been doing well since then although he has noted somewhat diminished erectile functioning.  His most recent PSA at 0.16 continues to decline, having been 0.57 ten months ago.  His last testosterone level also 10 months ago was 736, so  his drop in PSA is unlikely to be due to hypogonadism.  He does still have good libido.  His creatinine at 0.96 is normal and stable.     Today's exam of node-bearing areas, abdomen, external genitalia, phallus, hernia canals, and rectum are normal with his prostate quite flat on exam.       He is having some degree of erectile dysfunction and takes 20 mg of Cialis before he wants to have a desired erection which helps, but certainly not as much as he would like.  We told him he can go up to 30 mg, that is a pill and a half, but probably should switch to a different medicine if that is still not helpful or some other means of helping erections.  He will call us to let us know if the 30 mg is helpful or not.  Otherwise, he will be seeing Dr. Virgilio Frees in 4 months getting a PSA before that visit and he will see me again in a year getting labs before my visit.     Again, thank you for allowing [place] to be involved in the care of your patient.  We will, of course, keep you informed of all urological followup.      Sincerely,  ADDENDUM:  His dipstick urinalysis is negative today.             Rexene Alberts, MD, FACS    EMM/MODL  DD:  03/17/2020 15:31:21  DT:  03/17/2020 16:26:33  Job #:  [id number]    cc:  Dorothey Baseman, MD   [place], [place] [address]     Lonie Peak, MD   [place], [place]   [place],  [address]

## 2020-03-18 NOTE — Progress Notes (Signed)
Dictated Note

## 2020-03-22 ENCOUNTER — Encounter: Payer: Self-pay | Admitting: Gastroenterology

## 2020-03-30 ENCOUNTER — Encounter: Payer: Self-pay | Admitting: Primary Care

## 2020-04-05 ENCOUNTER — Ambulatory Visit: Payer: No Typology Code available for payment source | Admitting: Orthopedic Surgery

## 2020-04-05 ENCOUNTER — Encounter: Payer: Self-pay | Admitting: Orthopedic Surgery

## 2020-04-05 VITALS — BP 102/64 | Ht 72.0 in | Wt 155.0 lb

## 2020-04-05 DIAGNOSIS — Z96641 Presence of right artificial hip joint: Secondary | ICD-10-CM

## 2020-04-05 NOTE — Progress Notes (Signed)
Subjective: Timothy Cross 6 mos s/p r THA doing well overall, now with some lateral crepitus and activity based thigh pain.    ROS: No chest pain, SOB, fever, chills, skin erythema, all remaining systems negative.    Objective:  Constitutional: NAD, comfortable  Psychiatric: Normal affect  Musculoskeletal:  Ambulating unassisted, no pain with ROM    Radiographs: None    Impression:  1.  Right THA doing well    Plan:  Reassured. Discussed etiology of stem pain related to elastic modulus mismatch.     Answers for HPI/ROS submitted by the patient on 04/04/2020  What is your goal for today's visit?: surgery followup  Was this the result of an injury?: No  What is your pain level?: 1/10  Please describe the quality of your pain: : dull ache, numbness, tearing  Fever: No  Chills: No  Numbness: No  Tingling: No

## 2020-04-12 ENCOUNTER — Encounter: Payer: Self-pay | Admitting: Primary Care

## 2020-04-12 ENCOUNTER — Ambulatory Visit: Payer: No Typology Code available for payment source | Admitting: Primary Care

## 2020-04-12 VITALS — BP 118/66 | HR 60 | Temp 96.6°F | Ht 72.0 in | Wt 156.8 lb

## 2020-04-12 DIAGNOSIS — M159 Polyosteoarthritis, unspecified: Secondary | ICD-10-CM

## 2020-04-12 DIAGNOSIS — Z23 Encounter for immunization: Secondary | ICD-10-CM

## 2020-04-12 DIAGNOSIS — M8949 Other hypertrophic osteoarthropathy, multiple sites: Secondary | ICD-10-CM

## 2020-04-12 DIAGNOSIS — Z0000 Encounter for general adult medical examination without abnormal findings: Secondary | ICD-10-CM

## 2020-04-12 DIAGNOSIS — G2581 Restless legs syndrome: Secondary | ICD-10-CM

## 2020-04-12 NOTE — Progress Notes (Signed)
Reason for visit:   CC: Timothy Cross is 59 y.o. year old male coming in for a preventive health care physical  HPI:     He is doing well.     He has been noticing more sensation of his legs feeling tired over the past week or two. He has more swelling that progresses from his right knee down to his calf despite his left knee causing more pain. He will occasionally have a feeling of needing to move his legs when sitting or lying in bed.     Primary OA of multiple joints: He is taking tylenol nearly every days for the hips and knees. He is able to be active.     He has noticed a possible decrease in lung capacity since having COVID last fall. He sings when walking and notices that he needs to take a breath. He is able to complete activities without breathing issues.     SCREENING HISTORY  Colonoscopy Yes     Patient Active Problem List    Diagnosis Date Noted    Osteoarthritis of right hip 08/25/2019     Priority: High    Erectile dysfunction, unspecified erectile dysfunction type 03/17/2020    s/p R THA 09/24/19 09/24/2019    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    Chronic pain of left knee 04/10/2019    Bilateral hip pain 04/10/2019    History of prostate cancer 04/10/2019    Other specified aftercare following surgery 02/11/2019    S/P left inguinal hernia repair 02/11/2019    Prostate cancer 06/17/2017    Inguinal hernia bilateral, non-recurrent 03/06/2016    Prostate nodule without urinary obstruction 03/11/2014    ACL tear 02/27/2013    Personal history of colonic polyps 02/27/2013    Hyperlipidemia 12/10/2006     Created by Conversion        Osteoarthritis 12/10/2006     Created by Conversion         Medications:     Current Outpatient Medications   Medication Sig    tadalafil (CIALIS) 20 MG tablet Take 1 tablet (20 mg total) by mouth daily as needed for Erectile Dysfunction  Take at least 30 minutes prior to sexual activity.    amoxicillin (AMOXIL) 500 MG capsule Take 4 capsules 1 hour  prior to dental procedure     No current facility-administered medications for this visit.     Medication list reconciled this visit  Allergies:     Allergies   Allergen Reactions    Environmental Allergies Other (See Comments)     Pollen - sneezing, hay fever     Immunizations:     Most Recent Immunizations   Administered Date(s) Administered    Covid-19 mRNA vaccine (PFIZER) IM 30 mcg/0.2mL 11/02/2019    Hep B/HiB (Comvax) 04/17/2013    Hepatitis A Adult 02/24/2013    IPV 02/24/2013    Influenza Adult(75yr and up) 02/28/2012    Influenza Cell-Based prefilled syringe (Flucelvax) >/=67yrs 04/08/2017    Influenza PF(3 year and up) 02/24/2013    Influenza Quadrivalent 0.27mL prefilled syringe/single dose vial(FluLaval,Fluzone,Afluria,Fluarix) 04/12/2020    Tdap 02/24/2013    Typhoid Inactivated 11/24/2012    Zoster(Shingrix) 04/10/2019     Past Medical History:     Past Medical History:   Diagnosis Date    Anxiety     Arthritis     hip    Cancer     Colon polyp     Left cervical radiculopathy 12/29/2019  Osteoarthritis of right hip 08/25/2019      Past Surgical History:   Procedure Laterality Date    HERNIA REPAIR      INGUINAL HERNIA REPAIR      KNEE SURGERY Bilateral     arthroscopy ACL reconstruction    PR LAP, VENTRAL HERNIA REPAIR,REDUCIBLE N/A 10/29/2016    Procedure:  UMBILICAL HERNIA REPAIR;  Surgeon: Suszanne Finch, MD;  Location: [facility];  Service: General    PR [place] Bilateral 10/29/2016    Procedure:  LAPAROSCOPIC BILATERAL  INGUINAL HERNIA REPAIR with mesh;  Surgeon: Suszanne Finch, MD;  Location: [facility];  Service: General    PR [place] Left 01/29/2019    Procedure: LAPAROSCOPIC REPAIR WITH MESH, HERNIA, LEFT INGUINAL, ROBOT-ASSISTED;  Surgeon: Suszanne Finch, MD;  Location: [facility];  Service: General    PR TOTAL HIP ARTHROPLASTY Right 09/24/2019    Procedure: RIGHT ARTHROPLASTY, HIP, TOTAL ;  Surgeon: Arley Phenix, MD;   Location: [facility];  Service: Orthopedics    UMBILICAL HERNIA REPAIR      VASECTOMY      Surgery Of Male Genitalia Vasectomy Conversion Data        Family History:     Family History   Problem Relation Age of Onset    Conversion Other         20080701^Large Intestine Cancer^V16.0^Active^    Cancer Mother     Cancer Brother       Social History      Social History     Tobacco Use    Smoking status: Never Smoker    Smokeless tobacco: Never Used   Substance Use Topics    Alcohol use: Not Currently     Types: 1 Glasses of wine, 4 Cans of beer per week     Comment: 3-4 drinks on the weekend       PMH/PSH/FH/SH all reviewed with pt and updated as appropriate  Review of Systems   Constitutional: Feels generally well; no fevers, night sweats or unintentional weight loss.  Eyes: No visual changes, no eye pain, UTD with eye exams   ENT: No hearing difficulties, no ear pain, no sore throat, UTD with twice yearly dental visits  CV: No chest pain, palpitations, orthopnea, or leg swelling  Respiratory: No cough, wheezing or dyspnea  GI: No nausea/vomiting, abdominal pain, or change in bowel habits  GU: No dysuria, no change in urinary function.    MS: Hip pain, knee pain, restless legs, no axial problems   Skin: No rashes or new/changing skin lesions  Neuro: No headaches; no problems with speech or cognition or memory, no arm or leg weakness, no numbness or tingling  Psych: No problems with depression or anxiety   Endocrine: No polyuria or polydipsia, no heat or cold intolerance  Heme/lymph: No easy bleeding or bruising; no swollen glands  All/Immun: No allergic reactions  Other: Diet-healthy;  regular exercise-yes; seat belt-wears;  recreational drugs-not using;  hx abuse (sexual, physical, verbal)-no;  hx STD's-no    Physical Exam:     Vitals:    04/12/20 0933   BP: 118/66   Pulse: 60   Temp: 35.9 C (96.6 F)   TempSrc: Temporal   Weight: 71.1 kg (156 lb 12.8 oz)   Height: 1.829 m (6')     Pain    04/12/20 0933    PainSc:   2   PainLoc: Knee  Wt Readings from Last 3 Encounters:   04/12/20 71.1 kg (156 lb 12.8 oz)   04/05/20 70.3 kg (155 lb)   12/29/19 68 kg (150 lb)     BP Readings from Last 3 Encounters:   04/12/20 118/66   04/05/20 102/64   12/29/19 124/78       Vital Signs. As above  General: Appears healthy, comfortable, pleasant.  Skin: Normal texture, no rash or suspicious lesions.    Head: Atraumatic.    Eyes: Lids appear normal, no conjunctival injection or icterus.  PERRL;EOMI without nystagmus.   Irises normal.   ENT: Inspection reveals normal appearing ears and nose.  Otoscopic exam shows clean external auditory canals; tympanic membranes shiny and normal.  Hearing is intact to normal conversational tones.  Nostrils patent.  Lips and gums are normal and teeth in adequate repair.  Mucous membranes are moist. No oral lesions; pharynx without redness or exudate.    Neck: Supple, no masses, trachea is midline.  No thyroid enlargement, tenderness, or nodules.   Chest wall:  Symmetric.    Breasts: No masses or nipple inversion  Resp: Normal respiratory effort. Excellent air entry.  Normal vesicular breath sounds without rales or wheezing.    CV: Apical impulse normal.  Regular rhythm.  Normal S1/S2.  No murmur/rub/gallop.  Carotid pulses full and without bruits.  Abdominal aorta does not feel enlarged, no bruits.  Pedal pulses intact.  Trace edema.    GI: Nondistended.  BS normoactive.  Soft, no masses or tenderness.  No hepatosplenomegaly.   GU: Deferred to Urology.  Lymphatic:  No cervical or supraclavicular adenopathy.    MSK:  Normal gait and station.  Atraumatic.  No swelling or deformity of joints, muscles without atrophy.    Neuro:  Cranial nerves II-XII intact. No focal deficits of motor or sensory system.  DTR's symmetric.   Psych: Alert, Ox3.  Insight and judgment seem normal.  Mood/affect normal. Recent and remote memory intact.    RESULTS:     Labs reviewed prior to visit.    ASSESSMENT/PLAN:      Preventive health care:   --Aerobic exercise and diet discussed  --Colon CA screening done, discussed  --STD risk counseling and prevention discussed  --Skin CA awareness/prevention discussed  --Dental care discussed  --Cardiac risk factor modification discussed  --Motor vehicle safety discussed    --IMMUNIZATIONS  Flu shot: Yes   Gardasil: N/A  TdaP: Yes  Hepatitis: N/A  Pneumovax: N/A    Health Maintenance: These screening recommendations are based on USPSTF, [employer], and [place] state guidelines   Topic Date Due    HIV Screening  Never done    COVID-19 Vaccine (3 - Pfizer risk 3-dose series) 11/30/2019    Cholesterol Screening  03/03/2021    DEPRESSION SCREEN YEARLY  04/12/2021    Colon Cancer Screening  07/15/2024    Flu Shot  Completed    Shingles Vaccine  Completed    Hepatitis C Screening  Completed           Annual physical exam    Immunization due  -     Flu Quadrivalent <65 YO (normal dose)    Restless leg likely secondary to primary osteoarthritis involving multiple joints         - Discussed trial of magnesium and increase fluid hydration. Continue conservative therapy for osteoarthritis    Handouts given: AVS  Follow-up:    RTO 1 year    Lonie Peak, MD  04/12/2020    *  This note was dictated using [employer], reasonable efforts were made to correct for any dictation errors.

## 2020-05-27 ENCOUNTER — Ambulatory Visit: Payer: 59 | Attending: Internal Medicine

## 2020-05-27 DIAGNOSIS — Z23 Encounter for immunization: Secondary | ICD-10-CM

## 2020-05-27 NOTE — Progress Notes (Signed)
   Covid-19 Vaccination Clinic  Name:  Jeremiah Weaver    MRN: [id number] DOB: 06/24/1960  05/27/2020  Jeremiah Weaver was observed post Covid-19 immunization for 15 minutes without incident. He was provided with Vaccine Information Sheet and instruction to access the V-Safe system.   Jeremiah Weaver was instructed to call 911 with any severe reactions post vaccine: Marland Kitchen Difficulty breathing  . Swelling of face and throat  . A fast heartbeat  . A bad rash all over body  . Dizziness and weakness   Immunizations Administered    Name Date Dose VIS Date Route   Moderna Covid-19 Booster Vaccine 05/27/2020  2:07 PM 0.25 mL 03/30/2020 Intramuscular   Manufacturer: Moderna   Lot: [id number]   [place]: 32023-343-56

## 2020-05-31 ENCOUNTER — Other Ambulatory Visit: Payer: Self-pay

## 2020-07-07 ENCOUNTER — Telehealth: Payer: Self-pay

## 2020-07-07 NOTE — Telephone Encounter (Signed)
2/21 rescheduled as Dr. Virgilio Frees has procedure, patient aware of new appt.

## 2020-08-01 ENCOUNTER — Ambulatory Visit: Payer: 59 | Admitting: Radiation Oncology

## 2020-08-12 ENCOUNTER — Ambulatory Visit: Payer: No Typology Code available for payment source | Admitting: Radiation Oncology

## 2020-08-15 ENCOUNTER — Encounter: Payer: Self-pay | Admitting: Sports Medicine

## 2020-08-22 ENCOUNTER — Encounter: Payer: Self-pay | Admitting: Sports Medicine

## 2020-08-22 ENCOUNTER — Ambulatory Visit: Payer: No Typology Code available for payment source | Admitting: Sports Medicine

## 2020-08-22 VITALS — BP 124/70 | Ht 72.0 in | Wt 155.0 lb

## 2020-08-22 DIAGNOSIS — M1712 Unilateral primary osteoarthritis, left knee: Secondary | ICD-10-CM

## 2020-08-22 MED ORDER — BETAMETHASONE ACET & SOD PHOS 6 (3-3) MG/ML IJ SUSP *I*
12.0000 mg | Freq: Once | INTRAMUSCULAR | Status: AC | PRN
Start: 2020-08-22 — End: 2020-08-22
  Administered 2020-08-22: 12:00:00 12 mg via INTRA_ARTICULAR

## 2020-08-22 MED ORDER — ETHYL CHLORIDE EX AERO (MULTIPLE PATIENTS) *I*
1.0000 | INHALATION_SPRAY | Freq: Once | CUTANEOUS | Status: AC | PRN
Start: 2020-08-22 — End: 2020-08-22
  Administered 2020-08-22: 12:00:00 1 via TOPICAL

## 2020-08-22 NOTE — Procedures (Signed)
Large Joint Aspiration/Injection Procedure: L knee intra - articular    Date/Time: 08/22/2020 11:30 AM EDT  Consent given by: patient  Site marked: site marked  Timeout: Immediately prior to procedure a time out was called to verify the correct patient, procedure, equipment, support staff and site/side marked as required     Procedure Details    Location: knee - L knee intra - articular  Preparation: The site was prepped using the usual aseptic technique.  Anesthetics administered: 1 spray ethyl chloride  Intra-Articular Steroids administered: 12 mg betamethasone acetate-betamethasone sodium phosphate 6 (3-3) mg/mL  Dressing:  A dry, sterile dressing was applied.  Patient tolerance: patient tolerated the procedure well with no immediate complications

## 2020-08-22 NOTE — Patient Instructions (Signed)
Injection Instructions    · The injection you received today may take 5-7 days to work in the case of cortisone or 3 weeks or more for hyaluronic acid viscosupplements (Synvisc, Hyalgan, Orthovisc or others).   · You may find the area that was injected to be more painful for 1 or 2 days after the injection, after the numbing medicine wears off.  This happens as the medicine is being absorbed in your body.  · Rest for 2-3 days, activities of daily living are fine.   · It may be helpful to put ice on the body part that was injected to ease the pain and rest the joint for today.  · You may also use pain medication - Tylenol, Advil/Motrin or Aleve as appropriate.  · A cortisone injection may cause systemic reactions, including flushing, feeling hot, irritability or inability to sleep.  · If you are diabetic, a cortisone injection can increase your blood sugar level for several days.  Monitor your glucose levels closely.  · Contact the office [phone] if you feel ill, have excessive pain, if the area turns red or you have a fever

## 2020-08-22 NOTE — Progress Notes (Signed)
C.C.: Follow-up left knee pain.    HISTORY OF PRESENT ILLNESS:    Timothy Cross is a 60 y.o. male who returns today for followup care of left knee OA.  Patient was last seen for this condition 06/30/2019 and received a left knee cortisone injection.  In the interim he has been focused on his right hip arthroplasty with Dr. Noni Saupe.  Patient feels overall the knee did respond favorably to cortisone, and he is anticipating an upcoming trip to [place] therefore he would like this repeated.    The clinical course has fluctuated.  Current symptoms include: Posterior lateral and occasionally anterior knee pain, followed by swelling.  Once the knee is swollen he feels a sensation of stiffness and achiness.  This worsens with prolonged weightbearing such as walking for exercise.  Reports swelling, stiffness, decreased ROM, denies redness, warmth, bruising.     Evaluation to date:   MRI L knee 04/07/19  Interpretation:   1. Maceration of the posterior horn and body of the medial meniscus.   2. 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   3. Subtle bone marrow edema extending from the subjacent medial tibial spine to the rest of the medial tibial plateau.   4. Degenerative changes in the knee.   5. Multiple loculated small fluid collections as described above.     END OF IMPRESSION      Treatment to date:  [x]  icing  [x]  elevation  [x]  rest  []  heat  [x]  anti-inflammatories:   []  Tylenol  []  brace  [x]  Stretching/home exercises  [x]  PT  [x]  Cortisone injection: L knee 06/30/19  []  Visco supplementation:  []  Chiropractic  []  Massage therapy  []  Acupuncture  [x]  Other: L knee arthroscopy with Dr. Venia Minks ~15 years ago        Answers for HPI/ROS submitted by the patient on 08/21/2020  What is your goals for today's visit?: cortisone treatment  Was this the result of an injury?: No  What is your pain level?: 6/10  Please describe the quality of your pain: : aching, discomfort, fullness, sharp, sore, stiffness,  tenderness  Fever: No  Chills: No  Numbness: No  Tingling: No     New injury or exacerbation since last visit: No.     Medications, allergies, and surgical histories: Reviewed and confirmed.  Past medical history: Reviewed and confirmed.   Social history: Reviewed and confirmed.  Patient  reports that he has never smoked. He has never used smokeless tobacco.  Family history: Reviewed and confirmed.      Review of systems:  As above, denies fever, chills, rash, night sweats, unintentional weight loss.  All other systems negative.       OBJECTIVE:   Vital Signs:    Vitals:    08/22/20 1139   BP: 124/70   Weight: 70.3 kg (155 lb)   Height: 1.829 m (6')       General: no acute distress male.  Mental Status:  Alert and oriented x3, pleasant and cooperative with exam.  Extremities: No swelling, erythema, ecchymosis or edema except mentioned below.  Gait: Non-antalgic.    Musculoskeletal: Hip flexion and rotation without discomfort, nontender.     L KNEE: No swelling, erythema, ecchymosis or deformity. minimal palpable effusion, no warmth. Tender to palpation: Non-TTP.  Calf is soft, nonTTP.  Full range of motion symmetric to opposite side. 5/5 strength with knee flexion and extension against resistance.  Neurovascularly intact.  crepitus.  Lachman's: No definitive endpoint  Valgus stress: stable   Varus stress: stable  McMurray's: negative   Clark's patellar compression test: positive    ASSESSMENT / PLAN:  Impression:   60 y.o. male with left knee osteo-arthritis.    Plan:   -Left knee cortisone injection performed today, see separate procedure note  -Resume low impact activity as tolerated after 2-3 days rest  -Ice for 15-20 minutes at a time with a barrier on the skin as discussed.  -NSAIDs as needed for pain  -Follow-up as needed this patient has previously done relatively well with these injections.  Unfortunate this time his insurance will not cover viscosupplementation    Questions solicited and answered.      Precautions reviewed.  Medication changes, refills reviewed including directions, side effects and monitoring.    ATTENDING PHYSICIAN NOTE:    At the time of the visit, I saw and evaluated the patient with the PA.  I agree with the PA's history, findings, and plan of care as documented above.  The details of my evaluation, involvement, and decision-making are as follows:     I personally obtained the following history: left knee OA FU  My examination reveals the following: ttp knee.  Assessment: knee OA   I agree with the plan to  as outlined above.  I performed the procedure    Edman Circle, MD, [place]  [facility] Medicine Attending Physician

## 2020-08-30 ENCOUNTER — Other Ambulatory Visit: Payer: Self-pay | Admitting: Radiation Oncology

## 2020-08-30 DIAGNOSIS — C61 Malignant neoplasm of prostate: Secondary | ICD-10-CM

## 2020-09-06 ENCOUNTER — Ambulatory Visit: Payer: No Typology Code available for payment source | Admitting: Radiation Oncology

## 2020-09-09 ENCOUNTER — Other Ambulatory Visit
Admission: RE | Admit: 2020-09-09 | Discharge: 2020-09-09 | Disposition: A | Discharge status: Home / Self Care | Payer: No Typology Code available for payment source | Source: Ambulatory Visit | Attending: Radiation Oncology | Admitting: Radiation Oncology

## 2020-09-09 DIAGNOSIS — C61 Malignant neoplasm of prostate: Secondary | ICD-10-CM

## 2020-09-09 LAB — PSA (EFF.4-2010): PSA (eff. 4-2010): 0.13 ng/mL (ref 0.00–4.00)

## 2020-09-09 LAB — TESTOSTERONE: Testosterone: 472 ng/dL (ref 193–740)

## 2020-10-02 NOTE — Progress Notes (Addendum)
HPI:  60 yr old male with  Prostate Cancer, Favorable Intermediate Risk, T1c, Gleason 3+4 in 1 core, 3+3 in 2 cores, PSA 4.18 status post PSI on 10/17/17.    He is doing well with no significant change in appetite or energy level.  He is not typically getting up at night to urinate.  He denies pain or blood with urination.  He denies any significant urinary urgency, hesitancy or leakage.  His bowel movements are normal without blood.  No new bone pain. He has delayed emptying at the end of urination that is new but no nocturia. He states his stream slows down at the end of urinating.    Patient's problem list, allergies, and medications were reviewed and updated as appropriate. Please see the EHR for full details.    Expanded Prostate Cancer Index Composite for Clinical Practice (EPIC-CP)  Prostate Cancer Quality of Life (QOL)  Over the past 4 weeks, how much of a problem has your urinary function been for you?: No problem  Which of the following best describes your urinary control during the last 4 weeks?: Occasional dribbling  How many pads or adult diapers per day have you been using for urinary leakage during the past 4 weeks?: None  During the past 4 weeks, how big a problem, if any, has urinary dripping or leakage been for you?: Very small problem  During the past 4 weeks, how big a problem have you had with pain or burning on urination?: No problem  During the past 4 weeks, how big a problem have you had with weak urine stream or incomplete emptying?: Very small problem  During the past 4 weeks, how big a problem have you had with a need to urinate frequently during the day?: No problem  How big a problem, if any, have you had with rectal pain or urgency to have a bowel movement?: No problem  How big a problem, if any, have you had with an increased frequency of bowel movements?: No problem  Overall, how big a problem have your bowel habits been for you during the last 4 weeks?: No problem  During the past 4  weeks, how would you rate your ability to reach orgasm (climax): Poor  How would you describe the usual quality of your erections during the last 4 weeks?: Firm enough for masterbation and foreplay only  Overall how much of a problem has your sexual function or lack of sexual function been for you during the last 4 weeks?: Small problem  How big a problem, if any, has hot flashes or breast tenderness/enlargement been in the past 4 weeks?: No problem  How big a problem during the past 4 weeks have you had with feeling depressed?: Very small problem  How big a problem during the past 4 weeks have you had with a lack of energy?: No problem    Physical Exam:  Pain    10/03/20 1135   PainSc:   0 - No pain     KPS: 90  Physical Exam  Constitutional:       General: He is not in acute distress.     Appearance: He is not ill-appearing.   HENT:      Head: Normocephalic and atraumatic.   Eyes:      Extraocular Movements: Extraocular movements intact.      Pupils: Pupils are equal, round, and reactive to light.   Cardiovascular:      Rate and Rhythm: Normal rate.  Pulmonary:      Effort: Pulmonary effort is normal.   Abdominal:      General: There is no distension.   Musculoskeletal:         General: Normal range of motion.      Comments: moves all extremities spontaneously and with ease   Lymphadenopathy:      Cervical: No cervical adenopathy.   Skin:     General: Skin is warm and dry.   Neurological:      Mental Status: He is alert and oriented to person, place, and time. Mental status is at baseline.      Comments: fluent speech, no dysarthria   Psychiatric:         Mood and Affect: Mood normal.         Behavior: Behavior normal.         Thought Content: Thought content normal.         Lab Results   Component Value Date/Time    PSAR3 0.13 09/09/2020 02:40 PM    PSAR3 0.16 03/03/2020 09:47 AM    PSAR3 0.57 05/27/2019 07:20 AM    PSAR3 0.57 01/21/2019 02:20 PM    PSAR3 0.68 11/25/2018 09:57 AM    PSAR3 1.03 07/21/2018 11:25  AM    PSAR3 1.38 05/14/2018 09:46 AM    PSAR3 1.79 02/07/2018 09:56 AM    PSAR3 4.18 (H) 10/07/2017 03:01 PM    PSAR3 3.73 04/12/2017 01:41 PM    TESTO 472 09/09/2020 02:40 PM    TESTO 736 05/27/2019 07:20 AM    TESTO 455 01/21/2019 02:20 PM    TESTO 553 07/21/2018 11:25 AM    TESTO 583 02/07/2018 09:56 AM    TESTO 278 04/12/2017 01:41 PM    TESTO 325 11/01/2014 02:46 PM    ALK 54 03/03/2020 09:47 AM    ALK 61 08/25/2019 09:11 AM    ALK 57 05/27/2019 07:20 AM    ALK 55 05/14/2018 09:46 AM       Assessment Plan:  PSA continues to go down.  No clinical evidence for disease recurrence.    If his urinary symptoms worsen we can consider starting him on Flomax. He will discuss this with Dr. Lamar Blinks in October.  He was Rx'd Viagra today to try for his ED.    PSA prior to visits.  Follow-up with urology 03/27/2021 Dr. Lamar Blinks  Follow-up here in 1 year.    The plan was discussed with the patient and the patient/patient rep demonstrated understanding to the provider's satisfaction.    I saw and evaluated the patient. I agree with the resident's/fellow's findings and plan of care as documented.    Rush Landmark was seen today for a follow-up visit regarding his Prostate Cancer, Favorable Intermediate Risk, T1c, Gleason 3+4 in 1 core, 3+3 in 2 cores, PSA 4.18. He underwent Permanent Prostate Seed Implant Brachytherapy in 10/17/17, to 145 Gy, using Iodine 125 seeds.    He is feeling well. Good energy, no pain. No bowel symptoms.   He has had weak stream for years, with nocturia x1. Has never used Flomax. Over the past week, he has noticed more incomplete emptying and post-void dribbling. We will monitor this, and see if it persists he will let us know and he could use Flomax.   His erectile function is fair with Cialis 20 mg, but has trouble maintaining the erection. Will try Viagra 100 mg to see if he responds better to this. If not, we discussed that he could  talk with Dr. Lamar Blinks regarding trying injections or penile pump.   He is  scheduled to see Dr. Lamar Blinks in 03/2021, and we will see him in 1 year.     Dorothey Baseman, MD

## 2020-10-03 ENCOUNTER — Ambulatory Visit: Payer: No Typology Code available for payment source | Admitting: Radiation Oncology

## 2020-10-03 VITALS — BP 116/63 | HR 55 | Temp 98.6°F | Wt 157.0 lb

## 2020-10-03 DIAGNOSIS — C61 Malignant neoplasm of prostate: Secondary | ICD-10-CM

## 2020-10-03 MED ORDER — SILDENAFIL CITRATE 100 MG PO TABS *I*
100.0000 mg | ORAL_TABLET | Freq: Every day | ORAL | 2 refills | Status: AC | PRN
Start: 2020-10-03 — End: 2020-11-02

## 2020-10-03 MED ORDER — SILDENAFIL CITRATE 100 MG PO TABS *I*
100.0000 mg | ORAL_TABLET | Freq: Every day | ORAL | 2 refills | Status: DC | PRN
Start: 2020-10-03 — End: 2020-10-03

## 2021-01-11 ENCOUNTER — Encounter: Payer: Self-pay | Admitting: Sports Medicine

## 2021-01-25 ENCOUNTER — Encounter: Payer: Self-pay | Admitting: Sports Medicine

## 2021-01-25 ENCOUNTER — Ambulatory Visit: Payer: No Typology Code available for payment source | Admitting: Sports Medicine

## 2021-01-25 VITALS — BP 142/70 | HR 58 | Ht 72.0 in | Wt 155.0 lb

## 2021-01-25 DIAGNOSIS — M1712 Unilateral primary osteoarthritis, left knee: Secondary | ICD-10-CM

## 2021-01-25 NOTE — Progress Notes (Signed)
C.C.: Follow-up left knee pain.    HISTORY OF PRESENT ILLNESS:    Timothy Cross is a 60 y.o. male who returns today for followup care of left knee OA.  Patient was last seen for this condition 08/22/20 and received a left knee cortisone injection.   There was gradual, atraumtic return of his discomfort the past 1-2 months.  Denies interval injury or inciting event.  Patient feels overall the knee did respond favorably to cortisone, and he is anticipating an upcoming trip to [place] therefore he would like this repeated.    The clinical course has been stable.  Current symptoms include: Anterior and lateral knee pain, followed by swelling.  Once the knee is swollen he feels a sensation of stiffness and achiness.  This worsens with prolonged weightbearing such as walking for exercise or activities like gardening.  Reports swelling, stiffness, decreased ROM, denies redness, warmth, bruising.  Denies locking, giving way.      Evaluation to date:   MRI L knee 04/07/19  Interpretation:   1. Maceration of the posterior horn and body of the medial meniscus.   2. 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   3. Subtle bone marrow edema extending from the subjacent medial tibial spine to the rest of the medial tibial plateau.   4. Degenerative changes in the knee.   5. Multiple loculated small fluid collections as described above.     END OF IMPRESSION      Treatment to date:  '[x]'$  icing  '[x]'$  elevation  '[x]'$  rest  '[]'$  heat  '[x]'$  anti-inflammatories: Aleve 220-'440mg'$  as needed, typically once daily   '[]'$  Tylenol  '[]'$  brace  '[x]'$  Stretching/home exercises  '[x]'$  PT  '[x]'$  Cortisone injection: L knee 08/22/20  '[]'$  Visco supplementation:  '[]'$  Chiropractic  '[]'$  Massage therapy  '[]'$  Acupuncture  '[x]'$  Other: L knee arthroscopy with Dr. Venia Minks ~15 years ago      New injury or exacerbation since last visit: No.     Medications, allergies, and surgical histories: Reviewed and confirmed.  Past medical history: Reviewed and confirmed.   Social  history: Reviewed and confirmed.  Patient  reports that he has never smoked. He has never used smokeless tobacco.  Family history: Reviewed and confirmed.      Review of systems:  As above, denies fever, chills, rash, night sweats, unintentional weight loss.  All other systems negative.       OBJECTIVE:   Vital Signs:    Vitals:    01/25/21 1114   BP: 142/70   Pulse: 58   Weight: 70.3 kg (155 lb)   Height: 1.829 m (6')     General: no acute distress male.  Mental Status:  Alert and oriented x3, pleasant and cooperative with exam.  Extremities: No swelling, erythema, ecchymosis or edema except mentioned below.  Gait: Non-antalgic.    Musculoskeletal: Hip flexion and rotation without discomfort, nontender.     L KNEE: No swelling, erythema, ecchymosis.  Varus deformity. minimal palpable effusion, no warmth. Tender to palpation: Non-TTP.  Calf is soft, nonTTP.  Full range of motion symmetric to opposite side. 5/5 strength with knee flexion and extension against resistance.  Neurovascularly intact.  crepitus.         Valgus stress: stable   Varus stress: stable      ASSESSMENT / PLAN:  Impression:   60 y.o. male with left knee osteoarthritis.    Plan:   -Left knee cortisone injection performed today, see separate procedure  note  -Resume low impact activity as tolerated after 2-3 days rest  -Ice for 15-20 minutes at a time with a barrier on the skin as discussed.  -NSAIDs as needed for pain  -Follow-up as needed this patient has previously done relatively well with these injections.  Unfortunately at this time his insurance does not cover viscosupplementation    Questions solicited and answered.     Precautions reviewed.  Medication changes, refills reviewed including directions, side effects and monitoring.    Timothy Runner, PA    Answers for HPI/ROS submitted by the patient on 01/24/2021  What is your goals for today's visit?: cortisone treatment  Was this the result of an injury?: No  What is your pain level?:  5/10  Please describe the quality of your pain: : aching, fullness, sharp, stiffness  What diagnostic workup have you had for this condition?: MRI scan, X-ray  What treatments have you tried for this condition?: bracing, ice, NSAIDs  Progression since onset: : gradually worsening  Is this a work related condition? : No  Fever: No  Chills: No  Numbness: No  Tingling: No

## 2021-01-25 NOTE — Procedures (Signed)
Large Joint Aspiration/Injection Procedure: L knee intra - articular    Date/Time: 01/25/2021 11:15 AM EDT  Consent given by: patient  Site marked: site marked  Timeout: Immediately prior to procedure a time out was called to verify the correct patient, procedure, equipment, support staff and site/side marked as required     Procedure Details    Location: knee - L knee intra - articular  Preparation: The site was prepped using the usual aseptic technique.  Anesthetics administered: 1 spray ethyl chloride; 1.5 mL lidocaine HCL 1 %; 1 mL lidocaine HCL 1 %; 0.5 mL lidocaine HCL 1 %  Intra-Articular Steroids administered: 12 mg betamethasone acetate-betamethasone sodium phosphate 6 (3-3) mg/mL  Dressing:  A dry, sterile dressing was applied.  Patient tolerance: patient tolerated the procedure well with no immediate complications

## 2021-01-25 NOTE — Patient Instructions (Signed)
Injection Instructions    · The injection you received today may take 5-7 days to work in the case of cortisone or 3 weeks or more for hyaluronic acid viscosupplements (Synvisc, Hyalgan, Orthovisc or others).   · You may find the area that was injected to be more painful for 1 or 2 days after the injection, after the numbing medicine wears off.  This happens as the medicine is being absorbed in your body.  · Rest for 2-3 days, activities of daily living are fine.   · It may be helpful to put ice on the body part that was injected to ease the pain and rest the joint for today.  · You may also use pain medication - Tylenol, Advil/Motrin or Aleve as appropriate.  · A cortisone injection may cause systemic reactions, including flushing, feeling hot, irritability or inability to sleep.  · If you are diabetic, a cortisone injection can increase your blood sugar level for several days.  Monitor your glucose levels closely.  · Contact the office [phone] if you feel ill, have excessive pain, if the area turns red or you have a fever

## 2021-02-09 ENCOUNTER — Ambulatory Visit: Payer: No Typology Code available for payment source | Admitting: Sports Medicine

## 2021-02-21 MED ORDER — LIDOCAINE HCL 1 % IJ SOLN *I*
0.5000 mL | Freq: Once | INTRAMUSCULAR | Status: AC | PRN
Start: 2021-01-25 — End: 2021-01-25
  Administered 2021-01-25: .5 mL via INTRA_ARTICULAR

## 2021-02-21 MED ORDER — LIDOCAINE HCL 1 % IJ SOLN *I*
1.0000 mL | Freq: Once | INTRAMUSCULAR | Status: AC | PRN
Start: 2021-01-25 — End: 2021-01-25
  Administered 2021-01-25: 1 mL via INTRA_ARTICULAR

## 2021-02-21 MED ORDER — BETAMETHASONE ACET & SOD PHOS 6 (3-3) MG/ML IJ SUSP *I*
12.0000 mg | Freq: Once | INTRAMUSCULAR | Status: AC | PRN
Start: 2021-01-25 — End: 2021-01-25
  Administered 2021-01-25: 12 mg via INTRA_ARTICULAR

## 2021-02-21 MED ORDER — ETHYL CHLORIDE EX AERO (MULTIPLE PATIENTS) *I*
1.0000 | INHALATION_SPRAY | Freq: Once | CUTANEOUS | Status: AC | PRN
Start: 2021-01-25 — End: 2021-01-25
  Administered 2021-01-25: 1 via TOPICAL

## 2021-02-21 MED ORDER — LIDOCAINE HCL 1 % IJ SOLN *I*
1.5000 mL | Freq: Once | INTRAMUSCULAR | Status: AC | PRN
Start: 2021-01-25 — End: 2021-01-25
  Administered 2021-01-25: 1.5 mL via INTRA_ARTICULAR

## 2021-03-24 ENCOUNTER — Telehealth: Payer: Self-pay | Admitting: Urology

## 2021-03-24 ENCOUNTER — Other Ambulatory Visit: Payer: Self-pay | Admitting: Urology

## 2021-03-24 ENCOUNTER — Other Ambulatory Visit
Admission: RE | Admit: 2021-03-24 | Discharge: 2021-03-24 | Disposition: A | Discharge status: Home / Self Care | Payer: No Typology Code available for payment source | Source: Ambulatory Visit | Attending: Urology | Admitting: Urology

## 2021-03-24 DIAGNOSIS — C61 Malignant neoplasm of prostate: Secondary | ICD-10-CM | POA: Insufficient documentation

## 2021-03-24 DIAGNOSIS — N529 Male erectile dysfunction, unspecified: Secondary | ICD-10-CM | POA: Insufficient documentation

## 2021-03-24 DIAGNOSIS — R3989 Other symptoms and signs involving the genitourinary system: Secondary | ICD-10-CM

## 2021-03-24 LAB — BASIC METABOLIC PANEL
Anion Gap: 10 (ref 7–16)
CO2: 29 mmol/L — ABNORMAL HIGH (ref 20–28)
Calcium: 9.3 mg/dL (ref 8.6–10.2)
Chloride: 104 mmol/L (ref 96–108)
Creatinine: 1.03 mg/dL (ref 0.67–1.17)
Glucose: 113 mg/dL — ABNORMAL HIGH (ref 60–99)
Lab: 26 mg/dL — ABNORMAL HIGH (ref 6–20)
Potassium: 3.8 mmol/L (ref 3.3–5.1)
Sodium: 143 mmol/L (ref 133–145)
eGFR BY CREAT: 83 *

## 2021-03-24 LAB — TESTOSTERONE: Testosterone: 465 ng/dL (ref 193–740)

## 2021-03-24 LAB — PSA (EFF.4-2010): PSA (eff. 4-2010): 0.12 ng/mL (ref 0.00–4.00)

## 2021-03-24 NOTE — Telephone Encounter (Signed)
Called patient to notify he needs blood work prior to his appointment on 10/17 with Dr. Lamar Blinks or he may want to reschedule.  He is going to go tomorrow

## 2021-03-27 ENCOUNTER — Ambulatory Visit: Payer: No Typology Code available for payment source | Admitting: Urology

## 2021-03-27 ENCOUNTER — Encounter: Payer: Self-pay | Admitting: Urology

## 2021-03-27 ENCOUNTER — Other Ambulatory Visit: Payer: Self-pay

## 2021-03-27 VITALS — Ht 72.0 in | Wt 155.0 lb

## 2021-03-27 DIAGNOSIS — N529 Male erectile dysfunction, unspecified: Secondary | ICD-10-CM

## 2021-03-27 DIAGNOSIS — R3989 Other symptoms and signs involving the genitourinary system: Secondary | ICD-10-CM

## 2021-03-27 DIAGNOSIS — C61 Malignant neoplasm of prostate: Secondary | ICD-10-CM

## 2021-03-27 LAB — POCT URINALYSIS DIPSTICK
Blood,UA POCT: NEGATIVE
Glucose,UA POCT: NORMAL mg/dL
Ketones,UA POCT: NEGATIVE mg/dL
Leuk Esterase,UA POCT: NEGATIVE
Lot #: 62072801
Nitrite,UA POCT: NEGATIVE
PH,UA POCT: 7 (ref 5–8)
Protein,UA POCT: NEGATIVE mg/dL
Specific gravity,UA POCT: 1.01 (ref 1.002–1.030)

## 2021-03-27 NOTE — Letter (Signed)
PATIENT NAME:  MICAEL, BARB  DATE OF SERVICE:  03/27/2021  MRN:  [id number]  CSN:  [id number]  Page 2  [address]  [place], [place] [id number]  [id number]     March 27, 2021    Lonie Peak, M.D.   [address], [place], [address], [place]   [place]     Fax:  [phone]    RE:   YANDELL, MCJUNKINS  DOB:  Sep 01, 1960  Unit#:  [id number]  CSN:  [id number]    Dear [name]:     This is just a brief followup on your patient, Jru Pense, who as you know had both Gleason 3 + 4 equals 7/10 cancer as well as Gleason 3 + 3 equals 6 cancer, clinical stage cT1c with a PSA of 4.18 and underwent seed implant brachytherapy 3-1/2 years ago with 125 iodine seeds.  He continues to do quite well now with his PSA down to 0.12.  His creatinine of 1.03 and testosterone of 465 are both well within the normal range.  He does take Cialis, which does help with erections.       Today, his dipstick urinalysis is negative.  Today's exam of node-bearing areas, abdomen, external genitalia, phallus, hernia canals, and rectum are normal with his prostate quite flat on exam.       At this stage, we will just be seeing him again in a year, getting labs before that visit.  Dr. Virgilio Frees and his team will be seeing him in 6 months.     If his voiding issues worsen (he occasionally has some minimal postvoid dribbling), he will contact us.     Again, thank you for allowing [place] to be involved in the care of your patient.  We will, of course, keep you informed of all urological followup.     Sincerely,           Rexene Alberts, MD, FACS    EMM/MODL  DD:  03/27/2021 10:56:14  DT:  03/27/2021 12:32:37  Job #:  [id number]    cc:  Lonie Peak M.D., Fax:  [phone]   [address] [place], [address]   [place], [place] [address]     Kevin Bylund, MD   [address]   [place], [place] [address]  [place]

## 2021-03-28 NOTE — Progress Notes (Signed)
Dictated Note

## 2021-03-29 ENCOUNTER — Encounter: Payer: Self-pay | Admitting: Primary Care

## 2021-04-14 ENCOUNTER — Ambulatory Visit: Payer: No Typology Code available for payment source | Admitting: Primary Care

## 2021-04-14 ENCOUNTER — Encounter: Payer: Self-pay | Admitting: Primary Care

## 2021-04-14 ENCOUNTER — Other Ambulatory Visit: Payer: Self-pay

## 2021-04-14 VITALS — BP 120/66 | HR 56 | Temp 98.0°F | Resp 18 | Ht 72.0 in | Wt 155.0 lb

## 2021-04-14 DIAGNOSIS — M159 Polyosteoarthritis, unspecified: Secondary | ICD-10-CM

## 2021-04-14 DIAGNOSIS — E782 Mixed hyperlipidemia: Secondary | ICD-10-CM

## 2021-04-14 DIAGNOSIS — Z0000 Encounter for general adult medical examination without abnormal findings: Secondary | ICD-10-CM

## 2021-04-14 DIAGNOSIS — M7918 Myalgia, other site: Secondary | ICD-10-CM

## 2021-04-14 DIAGNOSIS — M62830 Muscle spasm of back: Secondary | ICD-10-CM

## 2021-04-14 DIAGNOSIS — Z23 Encounter for immunization: Secondary | ICD-10-CM

## 2021-04-14 DIAGNOSIS — Z8546 Personal history of malignant neoplasm of prostate: Secondary | ICD-10-CM

## 2021-04-14 NOTE — Patient Instructions (Signed)
Dr. Ralene Bathe. Carlota Raspberry  [address]  [place], [place] [address]  [phone]     [facility]  [address] [address]  [place]  [place], [place] [address]  [phone]     [facility]  [address] [place], [place] [address]  [phone]

## 2021-04-14 NOTE — Progress Notes (Signed)
Reason for visit:   CC: Timothy Cross is 60 y.o. year old male coming in for a preventive health care physical  HPI:     He has been experiencing right lower back pain for the past month. Symptoms are noticed when he lies in bed longer than usual or when standing up straight washing dishes. If he turns quickly, he will experience a sharp pain. Pain occurs nearly daily and brief. He does not regularly stretch. He has not been exercising as he previously was a couple years ago. He has seen Ortho in the interim for management of left knee osteoarthritis. He has had a right hip replacement.    Occasionally experiences discomfort in his left deltoid that is present with certain arm movements.    Hx of prostate cancer: No evidence of recurrence, continues to follow with Urology and Radiation Oncology.    Mixed HLD: Managing through diet.    SCREENING HISTORY  Colonoscopy Yes     Patient Active Problem List    Diagnosis Date Noted    Erectile dysfunction, unspecified erectile dysfunction type 03/17/2020    Chronic pain of left knee 04/10/2019    History of prostate cancer 04/10/2019    Prostate cancer 06/17/2017    ACL tear 02/27/2013    Personal history of colonic polyps 02/27/2013    Hyperlipidemia 12/10/2006     Created by Conversion        Osteoarthritis 12/10/2006     Created by Conversion         Medications:     Current Outpatient Medications   Medication Sig    tadalafil (CIALIS) 20 MG tablet Take 1 tablet (20 mg total) by mouth daily as needed for Erectile Dysfunction  Take at least 30 minutes prior to sexual activity.    amoxicillin (AMOXIL) 500 MG capsule Take 4 capsules 1 hour prior to dental procedure     No current facility-administered medications for this visit.     Medication list reconciled this visit  Allergies:     Allergies   Allergen Reactions    Environmental Allergies Other (See Comments)     Pollen - sneezing, hay fever     Immunizations:     Most Recent Immunizations   Administered  Date(s) Administered    Covid-19 mRNA vaccine (PFIZER) IM 30 mcg/0.79mL 05/17/2020    Hep B/HiB (Comvax) 04/17/2013    Hepatitis A Adult 02/24/2013    IPV 02/24/2013    Influenza Adult(34yr and up) 02/28/2012    Influenza Cell-Based prefilled syringe (Flucelvax) 43mo and up 04/08/2017    Influenza PF(3 year and up) 02/24/2013    Influenza Quadrivalent 0.44mL prefilled syringe/single dose vial(FluLaval,Fluzone,Afluria,Fluarix) 04/14/2021    Tdap 02/24/2013    Typhoid Inactivated 11/24/2012    Zoster(Shingrix) 04/10/2019     Past Medical History:     Past Medical History:   Diagnosis Date    Anxiety     Arthritis     hip    Cancer     Colon polyp     Inguinal hernia bilateral, non-recurrent 03/06/2016    Left cervical radiculopathy 12/29/2019    Osteoarthritis of right hip 08/25/2019    S/P left inguinal hernia repair 02/11/2019    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      Past Surgical History:   Procedure Laterality Date    HERNIA REPAIR      INGUINAL HERNIA REPAIR      KNEE SURGERY Bilateral     arthroscopy ACL reconstruction  PR LAP, VENTRAL HERNIA REPAIR,REDUCIBLE N/A 10/29/2016    Procedure:  UMBILICAL HERNIA REPAIR;  Surgeon: Suszanne Finch, MD;  Location: [facility] OR;  Service: General    PR [place] Bilateral 10/29/2016    Procedure:  LAPAROSCOPIC BILATERAL  INGUINAL HERNIA REPAIR with mesh;  Surgeon: Suszanne Finch, MD;  Location: [facility] OR;  Service: General    PR [place] Left 01/29/2019    Procedure: LAPAROSCOPIC REPAIR WITH MESH, HERNIA, LEFT INGUINAL, ROBOT-ASSISTED;  Surgeon: Suszanne Finch, MD;  Location: [facility];  Service: General    PR TOTAL HIP ARTHROPLASTY Right 09/24/2019    Procedure: RIGHT ARTHROPLASTY, HIP, TOTAL ;  Surgeon: Arley Phenix, MD;  Location: [facility];  Service: Orthopedics    UMBILICAL [place]      Surgery Of Male Genitalia Vasectomy Conversion Data        Family History:     Family  History   Problem Relation Age of Onset    Cancer Mother     Skin Cancer Father     Cancer Brother     Conversion Other         [phone] Intestine Cancer^V16.0^Active^      Social History      Social History     Tobacco Use    Smoking status: Never    Smokeless tobacco: Never   Substance Use Topics    Alcohol use: Yes     Types: 1 Glasses of wine, 4 Cans of beer per week     Comment: 3-4 drinks on the weekend       PMH/PSH/FH/SH all reviewed with pt and updated as appropriate  Review of Systems   Constitutional: Feels generally well; no fevers, night sweats or unintentional weight loss.  Eyes: No visual changes, no eye pain, UTD with eye exams   ENT: No hearing difficulties, no ear pain, no sore throat, UTD with twice yearly dental visits  CV: No chest pain, palpitations, orthopnea, or leg swelling  Respiratory: No cough, wheezing or dyspnea  GI: No nausea/vomiting, abdominal pain, or change in bowel habits  GU: No dysuria, no change in urinary function.    MS: Left deltoid pain, right lower back pain  Skin: No rashes or new/changing skin lesions  Neuro: No headaches; no problems with speech or cognition or memory, no arm or leg weakness, no numbness or tingling  Psych: No problems with depression or anxiety   Endocrine: No polyuria or polydipsia, no heat or cold intolerance  Heme/lymph: No easy bleeding or bruising; no swollen glands  All/Immun: No allergic reactions  Other: Diet-healthy;  regular exercise-yes; seat belt-wears;  recreational drugs-not using;  hx abuse (sexual, physical, verbal)-no;  hx STD's-no    Physical Exam:     Vitals:    04/14/21 0923   BP: 120/66   Pulse: 56   Resp: 18   Temp: 36.7 C (98 F)   SpO2: 99%   Weight: 70.3 kg (155 lb)   Height: 1.829 m (6')     Pain    04/14/21 0923   PainSc:   0 - No pain     Wt Readings from Last 3 Encounters:   04/14/21 70.3 kg (155 lb)   03/27/21 70.3 kg (155 lb)   01/25/21 70.3 kg (155 lb)     BP Readings from Last 3 Encounters:   04/14/21  120/66   01/25/21 142/70  10/03/20 116/63       Vital Signs. As above  General: Appears healthy, comfortable, pleasant.  Skin: Normal texture, no rash or suspicious lesions. Right supraclavicular hyperkeratotic pedunculated lesion. Multiple skin tags in bilateral axilla.  Head: Atraumatic.    Eyes: Lids appear normal, no conjunctival injection or icterus.  PERRL;EOMI without nystagmus.   Irises normal.   ENT: Inspection reveals normal appearing ears and nose.  Otoscopic exam shows clean external auditory canals; tympanic membranes shiny and normal.  Hearing is intact to normal conversational tones.  Nostrils patent.  Lips and gums are normal and teeth in adequate repair.  Mucous membranes are moist. No oral lesions; pharynx without redness or exudate.    Neck: Supple, no masses, trachea is midline.  No thyroid enlargement, tenderness, or nodules.   Chest wall:  Symmetric.    Breasts: No masses or nipple inversion  Resp: Normal respiratory effort. Excellent air entry.  Normal vesicular breath sounds without rales or wheezing.    CV: Apical impulse normal.  Regular rhythm.  Normal S1/S2.  No murmur/rub/gallop.  Carotid pulses full and without bruits.  Abdominal aorta does not feel enlarged, no bruits.  Pedal pulses intact.  No edema.    GI: Nondistended.  BS normoactive.  Soft, no masses or tenderness.  No hepatosplenomegaly.   GU: Deferred to Urology.  Lymphatic:  No cervical or supraclavicular adenopathy.    MSK:  Normal gait and station.  Atraumatic.  No swelling or deformity of joints, muscles without atrophy.  Right lumbar paraspinal muscle spasms. No pain to palpation of the left deltoid, ROM.  Neuro:  Cranial nerves II-XII intact. No focal deficits of motor or sensory system.  DTR's symmetric.   Psych: Alert, Ox3.  Insight and judgment seem normal.  Mood/affect normal. Recent and remote memory intact.    RESULTS:     Labs reviewed prior to visit.    EKG: NSR, HR ~ 54, normal axis, no acute ST segment changes,  similar to prior    ASSESSMENT/PLAN:     Preventive health care:   --Aerobic exercise and diet discussed  --Colon CA screening done, discussed  --STD risk counseling and prevention discussed  --Skin CA awareness/prevention discussed  --Dental care discussed  --Cardiac risk factor modification discussed  --Motor vehicle safety discussed    --IMMUNIZATIONS  Flu shot: Yes   Gardasil: N/A  TdaP: Yes  Hepatitis: N/A  Pneumovax: N/A    Health Maintenance: These screening recommendations are based on USPSTF, [employer], and [place] state guidelines   Topic Date Due    HIV Screening  Never done    COVID-19 Vaccine (4 - Booster for Pfizer series) 07/12/2020    Cholesterol Screening  03/03/2021    DEPRESSION SCREEN YEARLY  04/14/2022    Colon Cancer Screening  07/15/2024    Flu Shot  Completed    Shingles Vaccine  Completed    Hepatitis C Screening  Completed         Annual physical exam  -     EKG 12 lead unremarkable    Immunization due  -     Flu Quadrivalent <65 YO (normal dose)    Primary osteoarthritis involving multiple joints         - Continue management per Ortho, remain physically active as tolerated    History of prostate cancer         - Continue follow-up with Urology and Radiation Oncology    Hyperlipidemia, mixed  -  Lipid Panel (Reflex to Direct  LDL if Triglycerides more than 400); Future    Lumbar paraspinal muscle spasm          - Discussed pathophysiology and reviewed stretches. Given exercise resources.    Pain of left deltoid           - Given exercises for abduction and adduction of the shoulder    Handouts given: AVS  Follow-up:    RTO 1 year    Lonie Peak, MD  04/14/2021    *This note was dictated using Dragon [employer], reasonable efforts were made to correct for any dictation errors.

## 2021-06-11 DIAGNOSIS — S2239XA Fracture of one rib, unspecified side, initial encounter for closed fracture: Secondary | ICD-10-CM

## 2021-06-11 HISTORY — DX: Fracture of one rib, unspecified side, initial encounter for closed fracture: S22.39XA

## 2021-07-31 ENCOUNTER — Other Ambulatory Visit: Payer: Self-pay | Admitting: Urology

## 2021-07-31 ENCOUNTER — Encounter: Payer: Self-pay | Admitting: Urology

## 2021-07-31 DIAGNOSIS — C61 Malignant neoplasm of prostate: Secondary | ICD-10-CM

## 2021-07-31 MED ORDER — TADALAFIL 20 MG PO TABS *I*
20.0000 mg | ORAL_TABLET | Freq: Every day | ORAL | 3 refills | Status: DC | PRN
Start: 2021-07-31 — End: 2022-08-03

## 2021-08-19 DIAGNOSIS — R21 Rash and other nonspecific skin eruption: Secondary | ICD-10-CM | POA: Diagnosis not present

## 2021-09-22 ENCOUNTER — Other Ambulatory Visit
Admission: RE | Admit: 2021-09-22 | Discharge: 2021-09-22 | Disposition: A | Discharge status: Home / Self Care | Payer: No Typology Code available for payment source | Source: Ambulatory Visit | Attending: Primary Care | Admitting: Primary Care

## 2021-09-22 DIAGNOSIS — E782 Mixed hyperlipidemia: Secondary | ICD-10-CM | POA: Insufficient documentation

## 2021-09-22 DIAGNOSIS — C61 Malignant neoplasm of prostate: Secondary | ICD-10-CM | POA: Insufficient documentation

## 2021-09-22 LAB — PSA (EFF.4-2010): PSA (eff. 4-2010): 0.08 ng/mL (ref 0.00–4.00)

## 2021-09-22 LAB — LIPID PANEL
Chol/HDL Ratio: 2.6
Cholesterol: 192 mg/dL
HDL: 75 mg/dL — ABNORMAL HIGH (ref 40–60)
LDL Calculated: 104 mg/dL
Non HDL Cholesterol: 117 mg/dL
Triglycerides: 67 mg/dL

## 2021-09-22 LAB — MULTIPLE ORDERING DOCS

## 2021-09-25 DIAGNOSIS — Z13220 Encounter for screening for lipoid disorders: Secondary | ICD-10-CM | POA: Diagnosis not present

## 2021-09-25 DIAGNOSIS — Z125 Encounter for screening for malignant neoplasm of prostate: Secondary | ICD-10-CM | POA: Diagnosis not present

## 2021-09-25 DIAGNOSIS — E559 Vitamin D deficiency, unspecified: Secondary | ICD-10-CM | POA: Diagnosis not present

## 2021-09-25 DIAGNOSIS — Z0000 Encounter for general adult medical examination without abnormal findings: Secondary | ICD-10-CM | POA: Diagnosis not present

## 2021-10-03 ENCOUNTER — Other Ambulatory Visit: Payer: Self-pay | Admitting: Family Medicine

## 2021-10-03 ENCOUNTER — Ambulatory Visit: Payer: No Typology Code available for payment source | Admitting: Radiation Oncology

## 2021-10-03 ENCOUNTER — Other Ambulatory Visit: Payer: Self-pay

## 2021-10-03 VITALS — BP 129/62 | HR 56 | Temp 96.8°F | Wt 157.7 lb

## 2021-10-03 DIAGNOSIS — C61 Malignant neoplasm of prostate: Secondary | ICD-10-CM

## 2021-10-03 DIAGNOSIS — E7800 Pure hypercholesterolemia, unspecified: Secondary | ICD-10-CM

## 2021-10-03 DIAGNOSIS — Z0000 Encounter for general adult medical examination without abnormal findings: Secondary | ICD-10-CM | POA: Diagnosis not present

## 2021-10-03 NOTE — Progress Notes (Signed)
Radiation Oncology Follow-up Clinic Note:     Patient Identification:  Timothy Cross is a 61 y.o. male seen for a follow up visit today for a history of Prostate Cancer, Favorable Intermediate Risk, T1c, Gleason 3+4 in 1 core, 3+3 in 2 cores, PSA 4.18 status post PSI on 10/17/17.     Cancer Staging   Prostate cancer  Staging form: Prostate, AJCC 8th Edition  - Clinical stage from 07/21/2017: Stage IIB (cT1c, cN0, cM0, PSA: 3.7, Grade Group: 2) - Signed by Rubie Maid, MD on 07/21/2017    Current Therapy: Surveillance    Interval History: He is well overall with no significant change in appetite or energy level.  He typically does not need to get up at night to urinate.  He denies pain or blood with urination.  He denies any significant urinary urgency, hesitancy or leakage.  He has noticed occasional weak urinary stream with dribbling and taking a long time to empty his bladder.  This is not every time he urinates.  He has discussed Flomax in the past however he is not ready to try the medication at this point.  He has erectile dysfunction despite trying Cialis and Viagra.  He is interested in other options.  His bowel movements are normal without blood.      Medications:  Current Outpatient Medications   Medication Sig   . tadalafil (CIALIS) 20 MG tablet Take 1 tablet (20 mg total) by mouth daily as needed for Erectile Dysfunction  Take at least 30 minutes prior to sexual activity.   Marland Kitchen amoxicillin (AMOXIL) 500 MG capsule Take 4 capsules 1 hour prior to dental procedure       Allergies:  Allergies   Allergen Reactions   . Environmental Allergies Other (See Comments)     Pollen - sneezing, hay fever       Review of Systems:  As per HPI  12 point Review of Systems negative except as noted in HPI  Review of Systems   Constitutional: Negative.    Gastrointestinal: Negative for blood in stool, constipation and diarrhea.   Genitourinary:        0x/night, no pain/blood, no urgency/hesitancy, +ED even with meds.      Expanded Prostate Cancer Index Composite for Clinical Practice (EPIC-CP)  Prostate Cancer Quality of Life (QOL)  Over the past 4 weeks, how much of a problem has your urinary function been for you?: Very small problem  Which of the following best describes your urinary control during the last 4 weeks?: Occasional dribbling  How many pads or adult diapers per day have you been using for urinary leakage during the past 4 weeks?: None  During the past 4 weeks, how big a problem, if any, has urinary dripping or leakage been for you?: Very small problem  During the past 4 weeks, how big a problem have you had with weak urine stream or incomplete emptying?: Very small problem  How big a problem, if any, have you had with rectal pain or urgency to have a bowel movement?: No problem  How big a problem, if any, have you had with an increased frequency of bowel movements?: No problem  Overall, how big a problem have your bowel habits been for you during the last 4 weeks?: No problem  During the past 4 weeks, how would you rate your ability to reach orgasm (climax): Poor  How would you describe the usual quality of your erections during the last 4 weeks?: Firm  enough for masterbation and foreplay only  Overall how much of a problem has your sexual function or lack of sexual function been for you during the last 4 weeks?: Big problem  How big a problem, if any, has hot flashes or breast tenderness/enlargement been in the past 4 weeks?: No problem  How big a problem during the past 4 weeks have you had with feeling depressed?: No problem  How big a problem during the past 4 weeks have you had with a lack of energy?: No problem      Physical Examination  Vitals:    10/03/21 0932   BP: 129/62   Pulse: 56   Temp: 36 C (96.8 F)   Weight: 71.5 kg (157 lb 11.2 oz)     Pain    10/03/21 0932 10/03/21 0938   PainSc:   0 - No pain   0 - No pain       Physical Exam  Eyes:      Conjunctiva/sclera: Conjunctivae normal.    Genitourinary:     Comments: Rectal examination deferred  Musculoskeletal:         General: Normal range of motion.   Neurological:      Mental Status: He is alert.         Performance Status: Score-90- Able to carry on normal activity, minor signs or symptoms of disease.    Studies Reviewed:   Latest Reference Range & Units 02/13/11 08:00 03/14/12 09:24 02/26/13 07:47 03/08/14 07:53 11/01/14 14:46 03/15/17 11:04 04/12/17 13:41 10/07/17 15:01 02/07/18 09:56 05/14/18 09:46 07/21/18 11:25 11/25/18 09:57 01/21/19 14:20 05/27/19 07:20 03/03/20 09:47 09/09/20 14:40 03/24/21 15:44 09/22/21 14:32   PSA (eff. 09-2008) 0.00 - 4.00 ng/mL 0.90 1.28 1.41 1.97 2.29 3.61 3.73 4.18 (H) 1.79 1.38 1.03 0.68 0.57 0.57 0.16 0.13 0.12 0.08      Latest Reference Range & Units 11/01/14 14:46 04/12/17 13:41 02/07/18 09:56 07/21/18 11:25 01/21/19 14:20 05/27/19 07:20 09/09/20 14:40 03/24/21 15:44   Testosterone 193 - 740 ng/dL 454 098 119 147 [phone] 465       Assessment: [name] is a 61 y.o. male with history of Prostate Cancer, Favorable Intermediate Risk, T1c, Gleason 3+4 in 1 core, 3+3 in 2 cores, PSA 4.18 status post PSI on 10/17/17.    PSA continues to go down, no clinical evidence for disease recurrence.  We discussed usual follow-up routine, we typically follow for 5 years however he has the option of just following with urology and seeing [place] as needed.  He agrees with that plan.  He knows he can contact us at any time with questions, concerns or to be seen.  He is interested in other options for treatment of ED.  Message was sent to Dr. Lisabeth Pick.    Plan:  PSA prior to visits.  Follow up with Dr. Lisabeth Pick in urology on 04/02/2022.  Return to clinic on an as-needed basis.      Gerda Diss was encouraged to call with questions or concerns in the interim period.    Daeron Carreno, PA

## 2021-10-04 ENCOUNTER — Telehealth: Payer: Self-pay | Admitting: Urology

## 2021-10-04 NOTE — Telephone Encounter (Signed)
I called patient and left vm that I scheduled him on 5/31 @ 3:45 pm with Dr. Lisabeth Pick.

## 2021-10-26 DIAGNOSIS — D485 Neoplasm of uncertain behavior of skin: Secondary | ICD-10-CM | POA: Diagnosis not present

## 2021-10-26 DIAGNOSIS — D3610 Benign neoplasm of peripheral nerves and autonomic nervous system, unspecified: Secondary | ICD-10-CM | POA: Diagnosis not present

## 2021-10-27 ENCOUNTER — Ambulatory Visit
Admission: RE | Admit: 2021-10-27 | Discharge: 2021-10-27 | Disposition: A | Discharge status: Home / Self Care | Payer: No Typology Code available for payment source | Source: Ambulatory Visit | Attending: Family Medicine | Admitting: Family Medicine

## 2021-10-27 DIAGNOSIS — E7800 Pure hypercholesterolemia, unspecified: Secondary | ICD-10-CM

## 2021-11-07 ENCOUNTER — Other Ambulatory Visit: Payer: Self-pay | Admitting: Urology

## 2021-11-07 DIAGNOSIS — R3989 Other symptoms and signs involving the genitourinary system: Secondary | ICD-10-CM

## 2021-11-08 ENCOUNTER — Encounter: Payer: Self-pay | Admitting: Urology

## 2021-11-08 ENCOUNTER — Ambulatory Visit: Payer: No Typology Code available for payment source | Admitting: Urology

## 2021-11-08 ENCOUNTER — Other Ambulatory Visit: Payer: Self-pay

## 2021-11-08 VITALS — Wt 157.0 lb

## 2021-11-08 DIAGNOSIS — R3989 Other symptoms and signs involving the genitourinary system: Secondary | ICD-10-CM

## 2021-11-08 DIAGNOSIS — N529 Male erectile dysfunction, unspecified: Secondary | ICD-10-CM

## 2021-11-08 DIAGNOSIS — C61 Malignant neoplasm of prostate: Secondary | ICD-10-CM

## 2021-11-08 LAB — POCT URINALYSIS DIPSTICK
Glucose,UA POCT: NORMAL mg/dL
Ketones,UA POCT: NEGATIVE mg/dL
Leuk Esterase,UA POCT: NEGATIVE
Lot #: 65249301
Nitrite,UA POCT: NEGATIVE
PH,UA POCT: 5 (ref 5–8)
Specific gravity,UA POCT: 1.02 (ref 1.002–1.030)

## 2021-11-08 NOTE — Letter (Signed)
PATIENT NAME:  WOODFIN, MAYABB  DATE OF SERVICE:  11/08/2021  MRN:  [id number]  CSN:  [id number]  Page 2  [address]  [place], [place] [id number]  [id number]     Nov 08, 2021    Delma Officer, MD   [address]   [place]   [place]   [place] [address]    RE:   EFSTRATIOS, NAKASHIMA  DOB:  07/08/1960  Unit#:  [id number]  CSN:  [id number]    Dear Baxter Hire:     This is just a brief followup on your patient, Khaaliq Sharpley, who, as you know, had both Gleason 3 + 4 = 7/10 cancer as well as Gleason 3 + 3 = 6 cancer that was clinically stage cT1c, PSA 4.18 and who underwent prostate seed implant brachytherapy 4 years ago.  One hundred twenty-five iodine seeds were used.  He continues to do well clinically now, not having voiding symptoms.  He is having some problems with erectile functioning; however, and while he does get some erections with Cialis 20 mg, it is not really good enough to maintain erections.  PHYSICAL EXAMINATION:  Today's exam of node-bearing areas, abdomen, external genitalia, phallus, hernia canals, and rectum are normal with his prostate being extremely flat on exam.       His most recent PSA has descended to 0.08, his creatinine 1.03 and his testosterone 6 months ago was 465, well within the normal range.     He is only 61 years of age, so the erectile issue is a problem and the first thing we have suggested is that he look on a web site at different medical banding devices that could be placed around the erect phallus and prevent the egress of blood.  He does tell us that he gets erections but just cannot maintain them.  If this does not work, we talked about intraurethral inserts of vaso active substances, intracorporal injections or even surgical procedures, although we would clearly like to do most of these last.  He will look on the web site and let us know within about a month whether the band is helpful.  If not, before rejecting it, we would bring him in  and see how he uses it.  Under those circumstances, we may have him see one of my colleagues concerning erectile issues.  He has tried Viagra and that has not helped on a couple of occasions, or at least no more than the Cialis 20 mg does.     Again, thank you for allowing [place] to be involved in the care of your patient.  We will, of course, keep you informed of all urological followup.     ADDENDUM:   We will see him again in 6 months, assuming the erection issue is improved for routine followup.             Deitra Mayo, MD, FACS    EMM/MODL  DD:  11/08/2021 16:25:47  DT:  11/08/2021 17:39:53  Job #:  [id number]    cc:  Delma Officer, MD   [address]   [place]   [place], [place] [address]     Hilaria Ota, MD   [address]   [place], [place] [address]

## 2021-11-08 NOTE — Progress Notes (Signed)
Dictated Note

## 2021-11-08 NOTE — Patient Instructions (Addendum)
Can also consider a constriction/ tension ring in combination with pills or injections:      MenMD Tension loop  [email or web address]   It's about 1/3 of the way down the page     Other info:  [email or web address]     Venoseal   [email or web address]

## 2022-01-26 DIAGNOSIS — R808 Other proteinuria: Secondary | ICD-10-CM | POA: Diagnosis not present

## 2022-01-26 DIAGNOSIS — K644 Residual hemorrhoidal skin tags: Secondary | ICD-10-CM | POA: Diagnosis not present

## 2022-01-26 DIAGNOSIS — R319 Hematuria, unspecified: Secondary | ICD-10-CM | POA: Diagnosis not present

## 2022-01-26 DIAGNOSIS — K602 Anal fissure, unspecified: Secondary | ICD-10-CM | POA: Diagnosis not present

## 2022-02-14 DIAGNOSIS — B360 Pityriasis versicolor: Secondary | ICD-10-CM | POA: Diagnosis not present

## 2022-02-14 DIAGNOSIS — N390 Urinary tract infection, site not specified: Secondary | ICD-10-CM | POA: Diagnosis not present

## 2022-02-14 DIAGNOSIS — R319 Hematuria, unspecified: Secondary | ICD-10-CM | POA: Diagnosis not present

## 2022-02-14 DIAGNOSIS — Z09 Encounter for follow-up examination after completed treatment for conditions other than malignant neoplasm: Secondary | ICD-10-CM | POA: Diagnosis not present

## 2022-03-13 DIAGNOSIS — B9689 Other specified bacterial agents as the cause of diseases classified elsewhere: Secondary | ICD-10-CM | POA: Diagnosis not present

## 2022-03-13 DIAGNOSIS — N410 Acute prostatitis: Secondary | ICD-10-CM | POA: Diagnosis not present

## 2022-03-13 DIAGNOSIS — N390 Urinary tract infection, site not specified: Secondary | ICD-10-CM | POA: Diagnosis not present

## 2022-03-13 DIAGNOSIS — N402 Nodular prostate without lower urinary tract symptoms: Secondary | ICD-10-CM | POA: Diagnosis not present

## 2022-03-13 DIAGNOSIS — R338 Other retention of urine: Secondary | ICD-10-CM | POA: Diagnosis not present

## 2022-03-14 DIAGNOSIS — R339 Retention of urine, unspecified: Secondary | ICD-10-CM | POA: Diagnosis not present

## 2022-03-20 DIAGNOSIS — R338 Other retention of urine: Secondary | ICD-10-CM | POA: Diagnosis not present

## 2022-03-22 DIAGNOSIS — N281 Cyst of kidney, acquired: Secondary | ICD-10-CM | POA: Diagnosis not present

## 2022-03-22 DIAGNOSIS — R8271 Bacteriuria: Secondary | ICD-10-CM | POA: Diagnosis not present

## 2022-03-22 DIAGNOSIS — K6389 Other specified diseases of intestine: Secondary | ICD-10-CM | POA: Diagnosis not present

## 2022-03-22 DIAGNOSIS — R109 Unspecified abdominal pain: Secondary | ICD-10-CM | POA: Diagnosis not present

## 2022-03-22 DIAGNOSIS — K5730 Diverticulosis of large intestine without perforation or abscess without bleeding: Secondary | ICD-10-CM | POA: Diagnosis not present

## 2022-03-22 DIAGNOSIS — N410 Acute prostatitis: Secondary | ICD-10-CM | POA: Diagnosis not present

## 2022-03-22 DIAGNOSIS — I700 Atherosclerosis of aorta: Secondary | ICD-10-CM | POA: Diagnosis not present

## 2022-03-22 DIAGNOSIS — R338 Other retention of urine: Secondary | ICD-10-CM | POA: Diagnosis not present

## 2022-03-27 ENCOUNTER — Encounter: Payer: Self-pay | Admitting: Primary Care

## 2022-03-27 ENCOUNTER — Other Ambulatory Visit
Admission: RE | Admit: 2022-03-27 | Discharge: 2022-03-27 | Disposition: A | Discharge status: Home / Self Care | Payer: No Typology Code available for payment source | Source: Ambulatory Visit | Attending: Urology | Admitting: Urology

## 2022-03-27 DIAGNOSIS — Z0000 Encounter for general adult medical examination without abnormal findings: Secondary | ICD-10-CM

## 2022-03-27 DIAGNOSIS — C61 Malignant neoplasm of prostate: Secondary | ICD-10-CM | POA: Insufficient documentation

## 2022-03-27 DIAGNOSIS — N529 Male erectile dysfunction, unspecified: Secondary | ICD-10-CM | POA: Insufficient documentation

## 2022-03-27 DIAGNOSIS — R3989 Other symptoms and signs involving the genitourinary system: Secondary | ICD-10-CM | POA: Insufficient documentation

## 2022-03-27 DIAGNOSIS — N139 Obstructive and reflux uropathy, unspecified: Secondary | ICD-10-CM | POA: Diagnosis not present

## 2022-03-27 DIAGNOSIS — R338 Other retention of urine: Secondary | ICD-10-CM | POA: Diagnosis not present

## 2022-03-27 DIAGNOSIS — N410 Acute prostatitis: Secondary | ICD-10-CM | POA: Diagnosis not present

## 2022-03-27 LAB — BASIC METABOLIC PANEL
Anion Gap: 9 (ref 7–16)
CO2: 28 mmol/L (ref 20–28)
Calcium: 9.4 mg/dL (ref 8.6–10.2)
Chloride: 105 mmol/L (ref 96–108)
Creatinine: 0.92 mg/dL (ref 0.67–1.17)
Glucose: 85 mg/dL (ref 60–99)
Lab: 20 mg/dL (ref 6–20)
Potassium: 4.4 mmol/L (ref 3.3–5.1)
Sodium: 142 mmol/L (ref 133–145)
eGFR BY CREAT: 95 *

## 2022-03-27 LAB — TESTOSTERONE (DETECTABLE RANGE 12-1500 NG/DL): Testosterone: 682 ng/dL (ref 193–740)

## 2022-03-27 LAB — PSA (EFF.4-2010): PSA (eff. 4-2010): 0.06 ng/mL (ref 0.00–4.00)

## 2022-03-28 DIAGNOSIS — B999 Unspecified infectious disease: Secondary | ICD-10-CM | POA: Diagnosis not present

## 2022-03-28 DIAGNOSIS — K5900 Constipation, unspecified: Secondary | ICD-10-CM | POA: Diagnosis not present

## 2022-03-28 DIAGNOSIS — R748 Abnormal levels of other serum enzymes: Secondary | ICD-10-CM | POA: Diagnosis not present

## 2022-03-30 ENCOUNTER — Other Ambulatory Visit: Payer: Self-pay | Admitting: Urology

## 2022-03-30 DIAGNOSIS — R3989 Other symptoms and signs involving the genitourinary system: Secondary | ICD-10-CM

## 2022-04-02 ENCOUNTER — Other Ambulatory Visit: Payer: Self-pay

## 2022-04-02 ENCOUNTER — Ambulatory Visit: Payer: No Typology Code available for payment source | Admitting: Urology

## 2022-04-02 ENCOUNTER — Encounter: Payer: Self-pay | Admitting: Urology

## 2022-04-02 VITALS — Wt 155.0 lb

## 2022-04-02 DIAGNOSIS — N529 Male erectile dysfunction, unspecified: Secondary | ICD-10-CM

## 2022-04-02 DIAGNOSIS — C61 Malignant neoplasm of prostate: Secondary | ICD-10-CM

## 2022-04-02 DIAGNOSIS — R3989 Other symptoms and signs involving the genitourinary system: Secondary | ICD-10-CM

## 2022-04-02 DIAGNOSIS — R338 Other retention of urine: Secondary | ICD-10-CM | POA: Diagnosis not present

## 2022-04-02 DIAGNOSIS — N410 Acute prostatitis: Secondary | ICD-10-CM | POA: Diagnosis not present

## 2022-04-02 DIAGNOSIS — N139 Obstructive and reflux uropathy, unspecified: Secondary | ICD-10-CM | POA: Diagnosis not present

## 2022-04-02 LAB — POCT URINALYSIS DIPSTICK
Blood,UA POCT: NEGATIVE
Glucose,UA POCT: NORMAL mg/dL
Ketones,UA POCT: NEGATIVE mg/dL
Leuk Esterase,UA POCT: NEGATIVE
Lot #: 65249402
Nitrite,UA POCT: NEGATIVE
PH,UA POCT: 5 (ref 5–8)
Specific gravity,UA POCT: 1.02 (ref 1.002–1.030)

## 2022-04-02 NOTE — Progress Notes (Unsigned)
Dictated Note

## 2022-04-02 NOTE — Letter (Signed)
PATIENT NAME:  Timothy Cross, Timothy Cross  DATE OF SERVICE:  04/02/2022  MRN:  [id number]  CSN:  [id number]  Page 2  [address]  [place], [place] [id number]  [id number]     April 02, 2022    Delma Officer, MD  [address]  [place], [place] [address]    Fax:  [phone]    RE:   LENIEL, FRIEDBERG  DOB:  04/06/1961  Unit#:  [id number]  CSN:  [id number]    Dear Baxter Hire:    This is just a brief followup on your patient, Jaquis Dupree, who as you know, had both Gleason 3 + 4 equals 7/10 prostate cancer, as well as Gleason 3 + 3 equals 6 cancer that was clinically stage cT1c.  His PSA was 4.18 at the time of diagnosis and he underwent prostate seed implant brachytherapy 4-1/2 years ago, in which 125 iodine seeds were used.  He seemed to go through the treatment well and currently is not having a lot of side effects, although does use Cialis to try to help him maintain erectile functioning.  His PSA at 0.06 is obviously very low and fairly stable and his testosterone is 682, indicating that hypogonadism is not in any way causing the low PSA.  Other labs include a creatinine of 0.92.  He is not having any serious voiding issues and today's exam of node-bearing areas, abdomen, external genitalia, phallus, hernia canals, and rectum is normal with his prostate being quite flat on exam.  At this stage we will just get a PSA in 6 months and see him in 11 months (at his request, rather than a year).  He will be getting labs before that visit as well.    Again, thank you for allowing [place] to be involved in the care of your patient.  We will, of course, keep you informed of all urological followup.    Sincerely,    ADDENDUM:  His dipstick urinalysis is negative except for trace proteinuria.           Deitra Mayo, MD, FACS    EMM/MODL  DD:  04/02/2022 09:48:59  DT:  04/02/2022 11:17:05  Job #:  [id number]    cc:  Hilaria Ota, MD  [address]  [place], [place] [address]

## 2022-04-03 ENCOUNTER — Other Ambulatory Visit
Admission: RE | Admit: 2022-04-03 | Discharge: 2022-04-03 | Disposition: A | Discharge status: Home / Self Care | Payer: No Typology Code available for payment source | Source: Ambulatory Visit | Attending: Primary Care | Admitting: Primary Care

## 2022-04-03 DIAGNOSIS — Z0000 Encounter for general adult medical examination without abnormal findings: Secondary | ICD-10-CM | POA: Insufficient documentation

## 2022-04-03 DIAGNOSIS — R338 Other retention of urine: Secondary | ICD-10-CM | POA: Diagnosis not present

## 2022-04-03 LAB — CBC AND DIFFERENTIAL
Baso # K/uL: 0.1 10*3/uL (ref 0.0–0.2)
Basophil %: 1.1 %
Eos # K/uL: 0.3 10*3/uL (ref 0.0–0.5)
Eosinophil %: 5.9 %
Hematocrit: 43 % (ref 37–52)
Hemoglobin: 14 g/dL (ref 12.0–17.0)
IMM Granulocytes #: 0 10*3/uL (ref 0.0–0.0)
IMM Granulocytes: 0.2 %
Lymph # K/uL: 1.8 10*3/uL (ref 1.0–5.0)
Lymphocyte %: 31 %
MCH: 29 pg (ref 26–32)
MCHC: 32 g/dL (ref 32–37)
MCV: 90 fL (ref 75–100)
Mono # K/uL: 0.5 10*3/uL (ref 0.1–1.0)
Monocyte %: 9.4 %
Neut # K/uL: 3 10*3/uL (ref 1.5–6.5)
Nucl RBC # K/uL: 0 10*3/uL (ref 0.0–0.0)
Nucl RBC %: 0 /100 WBC (ref 0.0–0.2)
Platelets: 177 10*3/uL (ref 150–450)
RBC: 4.8 MIL/uL (ref 4.0–6.0)
RDW: 12.9 % (ref 0.0–15.0)
Seg Neut %: 52.4 %
WBC: 5.6 10*3/uL (ref 3.5–11.0)

## 2022-04-03 LAB — LIPID PANEL
Chol/HDL Ratio: 2.3
Cholesterol: 189 mg/dL
HDL: 83 mg/dL — ABNORMAL HIGH (ref 40–60)
LDL Calculated: 97 mg/dL
Non HDL Cholesterol: 106 mg/dL
Triglycerides: 47 mg/dL

## 2022-04-03 LAB — COMPREHENSIVE METABOLIC PANEL
ALT: 23 U/L (ref 0–50)
AST: 19 U/L (ref 0–50)
Albumin: 4.5 g/dL (ref 3.5–5.2)
Alk Phos: 61 U/L (ref 40–130)
Anion Gap: 11 (ref 7–16)
Bilirubin,Total: 0.6 mg/dL (ref 0.0–1.2)
CO2: 27 mmol/L (ref 20–28)
Calcium: 9.5 mg/dL (ref 8.6–10.2)
Chloride: 105 mmol/L (ref 96–108)
Creatinine: 1.06 mg/dL (ref 0.67–1.17)
Glucose: 91 mg/dL (ref 60–99)
Lab: 27 mg/dL — ABNORMAL HIGH (ref 6–20)
Potassium: 3.9 mmol/L (ref 3.3–5.1)
Sodium: 143 mmol/L (ref 133–145)
Total Protein: 6.8 g/dL (ref 6.3–7.7)
eGFR BY CREAT: 80 *

## 2022-04-03 LAB — VITAMIN D: 25-OH Vit Total: 42 ng/mL (ref 30–60)

## 2022-04-03 LAB — TSH: TSH: 2.73 u[IU]/mL (ref 0.27–4.20)

## 2022-04-03 LAB — HEMOGLOBIN A1C: Hemoglobin A1C: 5.2 %

## 2022-04-04 DIAGNOSIS — R945 Abnormal results of liver function studies: Secondary | ICD-10-CM | POA: Diagnosis not present

## 2022-04-04 DIAGNOSIS — R899 Unspecified abnormal finding in specimens from other organs, systems and tissues: Secondary | ICD-10-CM | POA: Diagnosis not present

## 2022-04-06 DIAGNOSIS — R338 Other retention of urine: Secondary | ICD-10-CM | POA: Diagnosis not present

## 2022-04-16 ENCOUNTER — Encounter: Payer: Self-pay | Admitting: Sports Medicine

## 2022-04-16 DIAGNOSIS — M1712 Unilateral primary osteoarthritis, left knee: Secondary | ICD-10-CM

## 2022-04-19 ENCOUNTER — Ambulatory Visit: Payer: No Typology Code available for payment source | Admitting: Primary Care

## 2022-04-19 ENCOUNTER — Other Ambulatory Visit: Payer: Self-pay

## 2022-04-19 ENCOUNTER — Encounter: Payer: Self-pay | Admitting: Primary Care

## 2022-04-19 VITALS — BP 112/66 | HR 55 | Temp 97.1°F | Ht 72.0 in | Wt 149.0 lb

## 2022-04-19 DIAGNOSIS — Z0000 Encounter for general adult medical examination without abnormal findings: Secondary | ICD-10-CM

## 2022-04-19 DIAGNOSIS — K219 Gastro-esophageal reflux disease without esophagitis: Secondary | ICD-10-CM

## 2022-04-19 DIAGNOSIS — M159 Polyosteoarthritis, unspecified: Secondary | ICD-10-CM

## 2022-04-19 DIAGNOSIS — Z8781 Personal history of (healed) traumatic fracture: Secondary | ICD-10-CM

## 2022-04-19 DIAGNOSIS — Z8546 Personal history of malignant neoplasm of prostate: Secondary | ICD-10-CM

## 2022-04-19 DIAGNOSIS — Z23 Encounter for immunization: Secondary | ICD-10-CM

## 2022-04-20 NOTE — Progress Notes (Signed)
Reason for visit:   CC: Timothy Cross is 61 y.o. year old male coming in for a preventive health care physical  HPI:     He is doing fine.    A couple months ago, he was helping a friend down in [place] when he likely sustained a left lower rib fracture. He was digging at the time when the handled pushed up in his left lower chest. He had significant pain for a period of time. He is left with asymmetry on that left side compared to the right. He occasionally will have some soreness in the area if he moves or turns the right way.     He has follow-up evaluation scheduled to discuss left TKA. He is starting to have more discomfort in his left hip as well.     Hx of prostate CA: He has had no evidence of recurrence. He continues to follow with Urology.     He occasionally will get a sensation in chest that may be attributed to acid reflux.     He will be sending 2 months in [place].    Patient Active Problem List    Diagnosis Date Noted    Erectile dysfunction, unspecified erectile dysfunction type 03/17/2020    Chronic pain of left knee 04/10/2019    History of prostate cancer 04/10/2019    ACL tear 02/27/2013    Personal history of colonic polyps 02/27/2013    Hyperlipidemia 12/10/2006    Osteoarthritis 12/10/2006     Medications:     Current Outpatient Medications   Medication Sig    tadalafil (CIALIS) 20 MG tablet Take 1 tablet (20 mg total) by mouth daily as needed for Erectile Dysfunction  Take at least 30 minutes prior to sexual activity.    amoxicillin (AMOXIL) 500 MG capsule Take 4 capsules 1 hour prior to dental procedure     No current facility-administered medications for this visit.     Medication list reconciled this visit  Allergies:     Allergies   Allergen Reactions    Environmental Allergies Other (See Comments)     Pollen - sneezing, hay fever    Poison Ivy Extract Hives     Immunizations:     Most Recent Immunizations   Administered Date(s) Administered    Covid-19 mRNA vaccine (PFIZER) IM  30 mcg/0.77mL 05/17/2020    Hep B/HiB (Comvax) 04/17/2013    Hepatitis A Adult 02/24/2013    IPV 02/24/2013    Influenza Adult(71yr and up) 02/28/2012    Influenza PF(3 year and up) 02/24/2013    Influenza Quad 0.68mL prefilled syringe/single dose vial (FluLaval,Fluzone,Afluria,Fluarix) 04/19/2022    Influenza Quad Cell-Based prefilled syringe (Flucelvax) 72mo+ 04/08/2017    Tdap 02/24/2013    Typhoid Inactivated 11/24/2012    Zoster(Shingrix) 04/10/2019     Past Medical History:     Past Medical History:   Diagnosis Date    Anxiety     Arthritis     hip    Cancer     Colon polyp     Inguinal hernia bilateral, non-recurrent 03/06/2016    Left cervical radiculopathy 12/29/2019    Osteoarthritis of right hip 08/25/2019    S/P left inguinal hernia repair 02/11/2019    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      Past Surgical History:   Procedure Laterality Date    HERNIA REPAIR      INGUINAL HERNIA REPAIR      KNEE SURGERY Bilateral  arthroscopy ACL reconstruction    PR ARTHRP ACETBLR/PROX FEM PROSTC AGRFT/ALGRFT Right 09/24/2019    Procedure: RIGHT ARTHROPLASTY, HIP, TOTAL ;  Surgeon: Laurance Flatten, MD;  Location: [facility];  Service: Orthopedics    PR LAPAROSCOPY SURG RPR INITIAL INGUINAL HERNIA Bilateral 10/29/2016    Procedure:  LAPAROSCOPIC BILATERAL  INGUINAL HERNIA REPAIR with mesh;  Surgeon: Gwenyth Allegra, MD;  Location: [facility];  Service: General    PR LAPAROSCOPY SURG RPR INITIAL INGUINAL HERNIA Left 01/29/2019    Procedure: LAPAROSCOPIC REPAIR WITH MESH, HERNIA, LEFT INGUINAL, ROBOT-ASSISTED;  Surgeon: Gwenyth Allegra, MD;  Location: [facility];  Service: General    PR LAPS REPAIR HERNIA EXCEPT INCAL/INGUN REDUCIBLE N/A 10/29/2016    Procedure:  UMBILICAL HERNIA REPAIR;  Surgeon: Gwenyth Allegra, MD;  Location: [facility];  Service: General    UMBILICAL HERNIA REPAIR      VASECTOMY      Surgery Of Male Genitalia Vasectomy Conversion Data        Family History:     Family History   Problem Relation Age of Onset     Cancer Mother     Skin Cancer Father     Cancer Brother     Conversion Other         [phone] Intestine Cancer^V16.0^Active^      Social History      Social History     Tobacco Use    Smoking status: Never    Smokeless tobacco: Never   Substance Use Topics    Alcohol use: Yes     Types: 1 Glasses of wine, 4 Cans of beer per week     Comment: 3-4 drinks on the weekend       PMH/PSH/FH/SH all reviewed with pt and updated as appropriate  Review of Systems   Constitutional: Feels generally well; no fevers, night sweats or unintentional weight loss.  Eyes: No visual changes, no eye pain, UTD with eye exams   ENT: No hearing difficulties, no ear pain, no sore throat, UTD with twice yearly dental visits  CV: No chest pain, palpitations, orthopnea, or leg swelling  Respiratory: No cough, wheezing or dyspnea  GI: Acid reflux, No nausea/vomiting or change in bowel habits  GU: No dysuria, no change in urinary function.    MS: left hip pain, left knee pain, remote left lower rib fracture, No joint swelling, no axial problems   Skin: No rashes or new/changing skin lesions  Neuro: No headaches; no problems with speech or cognition or memory, no arm or leg weakness, no numbness or tingling  Psych: No problems with depression or anxiety   Endocrine: No polyuria or polydipsia, no heat or cold intolerance  Heme/lymph: No easy bleeding or bruising; no swollen glands  All/Immun: No allergic reactions      Physical Exam:     Vitals:    04/19/22 0936 04/19/22 1015   BP: 124/84 112/66   Pulse: 55    Temp: 36.2 C (97.1 F)    TempSrc: Temporal    SpO2: 98%    Weight: 67.6 kg (149 lb)    Height: 1.829 m (6')      Wt Readings from Last 3 Encounters:   04/19/22 67.6 kg (149 lb)   04/02/22 70.3 kg (155 lb)   11/08/21 71.2 kg (157 lb)     BP Readings from Last 3 Encounters:   04/19/22 112/66   10/03/21 129/62   04/14/21 120/66  Vital Signs. As above  General: Appears healthy, comfortable, pleasant.  Skin: Normal texture, no rash or  suspicious lesions.    Head: Atraumatic.    Eyes: Lids appear normal, no conjunctival injection or icterus.  PERRL;EOMI without nystagmus.   Irises normal.   ENT: Inspection reveals normal appearing ears and nose.  Otoscopic exam shows clean external auditory canals; tympanic membranes shiny and normal.  Hearing is intact to normal conversational tones.  Nostrils patent.  Lips and gums are normal and teeth in adequate repair.  Mucous membranes are moist. No oral lesions; pharynx without redness or exudate.    Neck: Supple, no masses, trachea is midline.  No thyroid enlargement, tenderness, or nodules.   Chest wall:  Asymmetric lower right chest wall.     Breasts: No masses or nipple inversion   Resp: Normal respiratory effort. Excellent air entry.  Normal vesicular breath sounds without rales or wheezing.    CV: Apical impulse normal.  Regular rhythm.  Normal S1/S2.  No murmur/rub/gallop.  Carotid pulses full and without bruits.  Abdominal aorta does not feel enlarged, no bruits.  Pedal pulses intact.  No edema.    GI: Nondistended.  BS normoactive.  Soft, no masses or tenderness.  No hepatosplenomegaly.   GU: Deferred to Urology.  Lymphatic:  No cervical or supraclavicular adenopathy.   MSK:  Normal gait and station.  Atraumatic.  Varus abnormality of the left knee, no joint effusion.   Neuro:  Cranial nerves II-XII intact. No focal deficits of motor or sensory system.  DTR's symmetric.   Psych: Alert, Ox3.  Insight and judgment seem normal.  Mood/affect normal. Recent and remote memory intact.    RESULTS:     Labs reviewed prior to visit.    EKG: Sinus bradycardia, HR ~ 48, normal axis, no acute ST segment changes, similar to prior    ASSESSMENT/PLAN:     Timothy Cross was seen today for annual exam.    Annual physical exam  -     EKG 12 lead unremarkable; similar to prior    Immunization due  -     Flu Quadrivalent <65 YO (normal dose)    Primary osteoarthritis involving multiple joints        - Continue management per  Orthopedics    History of prostate cancer         - No recurrence, continue surveillance per Urology    History of rib fracture         - Conservative management; no need for imaging given unlikely to change outcome    Acid reflux          - Monitor, consider OTC pepcid, avoid triggers        Health Maintenance: These screening recommendations are based on USPSTF, [employer], and [place] state guidelines   Topic Date Due    HIV Screening  Never done    Hepatitis B Vaccine (3 of 3 - 19+ 3-dose series) 09/15/2013    COVID-19 Vaccine (4 - 2023-24 season) 02/09/2022    Pneumococcal Vaccination (1 - PCV) 04/25/2023 (Originally 01/23/1967)    DTaP/Tdap/Td Vaccines (4 - Td or Tdap) 02/25/2023    Cholesterol Screening  04/04/2023    Depression Screen Yearly  04/20/2023    Colon Cancer Screening  07/15/2024    Flu Shot  Completed    Shingles Vaccine  Completed    Hepatitis C Screening  Completed    Rotavirus Vaccine  Aged Out     Coun/Edu   --  Lab results discussed  --Diet reviewed and discussed  --Body Weight reviewed and discussed         Body mass index is 20.21 kg/m.   --Aerobic exercise discussed  --Tobacco use discussed  --Alcohol use discussed  --Recreational drug use discussed; no recent usage  --Colon CA screening discussed.  --Prostate cancer screening discussed  --STD risk counseling and prevention discussed, no HIV concerns  --Immunizations reviewed  --Hepatitis C screening for persons born [birth year] to 1965  --Depression screening evaluation performed today (PHQ-2)  Score of 0, which is not showing any signs of depression  No further treatment advised.  Guideline is to rescreen in one year.   --Skin CA awareness/prevention discussed  --Dental care discussed  --Hearing discussed  --ADLS reviewed and is independent  --Cardiac risk factor modification discussed    Handouts given: AVS  Follow-up:    RTO 1 year    Delma Officer, MD  04/19/2022    *Reasonable efforts were made to correct for any dictation errors.

## 2022-05-08 DIAGNOSIS — R338 Other retention of urine: Secondary | ICD-10-CM | POA: Diagnosis not present

## 2022-07-05 DIAGNOSIS — N402 Nodular prostate without lower urinary tract symptoms: Secondary | ICD-10-CM | POA: Diagnosis not present

## 2022-07-05 DIAGNOSIS — B961 Klebsiella pneumoniae [K. pneumoniae] as the cause of diseases classified elsewhere: Secondary | ICD-10-CM | POA: Diagnosis not present

## 2022-07-05 DIAGNOSIS — N390 Urinary tract infection, site not specified: Secondary | ICD-10-CM | POA: Diagnosis not present

## 2022-07-05 DIAGNOSIS — R338 Other retention of urine: Secondary | ICD-10-CM | POA: Diagnosis not present

## 2022-07-18 DIAGNOSIS — R339 Retention of urine, unspecified: Secondary | ICD-10-CM | POA: Diagnosis not present

## 2022-07-18 DIAGNOSIS — N320 Bladder-neck obstruction: Secondary | ICD-10-CM | POA: Diagnosis not present

## 2022-07-30 DIAGNOSIS — N390 Urinary tract infection, site not specified: Secondary | ICD-10-CM | POA: Diagnosis not present

## 2022-07-30 DIAGNOSIS — R338 Other retention of urine: Secondary | ICD-10-CM | POA: Diagnosis not present

## 2022-07-30 DIAGNOSIS — B961 Klebsiella pneumoniae [K. pneumoniae] as the cause of diseases classified elsewhere: Secondary | ICD-10-CM | POA: Diagnosis not present

## 2022-08-03 ENCOUNTER — Other Ambulatory Visit: Payer: Self-pay | Admitting: Urology

## 2022-08-03 DIAGNOSIS — C61 Malignant neoplasm of prostate: Secondary | ICD-10-CM

## 2022-08-03 NOTE — Telephone Encounter (Signed)
Prescription was last filled July 31 2021    Patient was last seen 04/02/2022    Plan from last visit: his is just a brief followup on your patient, Timothy Cross, who as you know, had both Gleason 3 + 4 equals 7/10 prostate cancer, as well as Gleason 3 + 3 equals 6 cancer that was clinically stage cT1c.  His PSA was 4.18 at the time of diagnosis and he underwent prostate seed implant brachytherapy 4-1/2 years ago, in which 125 iodine seeds were used.  He seemed to go through the treatment well and currently is not having a lot of side effects, although does use Cialis to try to help him maintain erectile functioning.  His PSA at 0.06 is obviously very low and fairly stable and his testosterone is 682, indicating that hypogonadism is not in any way causing the low PSA.  Other labs include a creatinine of 0.92.  He is not having any serious voiding issues and today's exam of node-bearing areas, abdomen, external genitalia, phallus, hernia canals, and rectum is normal with his prostate being quite flat on exam.  At this stage we will just get a PSA in 6 months and see him in 11 months (at his request, rather than a year).  He will be getting labs before that visit as well.     Again, thank you for allowing [place] to be involved in the care of your patient.  We will, of course, keep you informed of all urological followup. (copy from last note)    Next appointment date: 02/27/2023    Please review and sign if appropriate.

## 2022-08-08 ENCOUNTER — Ambulatory Visit: Payer: No Typology Code available for payment source | Admitting: Orthopedic Surgery

## 2022-08-08 ENCOUNTER — Other Ambulatory Visit: Payer: Self-pay

## 2022-08-08 ENCOUNTER — Encounter: Payer: Self-pay | Admitting: Orthopedic Surgery

## 2022-08-08 ENCOUNTER — Ambulatory Visit
Admission: RE | Admit: 2022-08-08 | Discharge: 2022-08-08 | Disposition: A | Discharge status: Home / Self Care | Payer: No Typology Code available for payment source | Source: Ambulatory Visit

## 2022-08-08 VITALS — BP 132/67 | HR 53 | Ht 72.0 in | Wt 149.0 lb

## 2022-08-08 DIAGNOSIS — M25562 Pain in left knee: Secondary | ICD-10-CM

## 2022-08-08 DIAGNOSIS — M1712 Unilateral primary osteoarthritis, left knee: Secondary | ICD-10-CM

## 2022-08-08 DIAGNOSIS — M170 Bilateral primary osteoarthritis of knee: Secondary | ICD-10-CM

## 2022-08-08 DIAGNOSIS — M25462 Effusion, left knee: Secondary | ICD-10-CM

## 2022-08-08 DIAGNOSIS — M7641 Tibial collateral bursitis [Pellegrini-Stieda], right leg: Secondary | ICD-10-CM

## 2022-08-08 NOTE — Progress Notes (Signed)
CHART REVIEWED OK TO SCHEDULE SURGERY    THE FOLLOWING EDUCATION WAS SENT TO THE PATIENT THROUGH [employer]:    I am sending this message to let you know your surgeon has given [place] your name to schedule you for Joint Replacement. You will be getting an email from [email or web address] to set up LOG IN information to the Educational Platform. Once you complete that, you will receive a TIME SENSITIVE questionnaire via [facility].  Please complete this very important questionnaire ASAP and submit it back through [facility].   Our Nurse Navigator team will review this document and if all is in place-we will forward to the schedulers.  Also, once you have a surgery date, please contact your Primary Care Physician to schedule a Medical Clearance appointment and call the nurse navigators with the date of this appointment.  Call the nurse navigators if you have any questions or concerns.  [phone]

## 2022-08-08 NOTE — Progress Notes (Signed)
This is a confidential patient report written for the express purpose of professional communication.    Subjective:  Patient Timothy Cross, a status-positive right toe lip arthroplasty, presented on 2022-08-08 with complaints of bilateral knee pain, more severe on the left side. The pain is long-standing and worsens with physical activity, affecting his quality of life, daily activities, and recreational activities. He mentions being active and wishes to be able to resume playing pickleball. Timothy Cross has tried conservative care treatments such as oral anti-inflammatory medicines, physical therapy/home exercises, and knee injections which have failed to alleviate his pain. He also mentioned having undergone a previous right knee ACL reconstruction.    Objective:  Upon observation, Timothy Cross is in no acute distress and is seated comfortably in a chair. His left knee's soft tissue envelope is intact, with no effusion observed. He has a full range of motion from full extension to 125 degrees, (but reports a progressive restricted range of motion with daily activities). No coronal or sagittal plane instability is detected, but the overall varus alignment is noted. All compartments appear soft and compressible, and neurocircuitry examination yields results within normal limits.    Imaging:  - Four views bilateral knees were personally reviewed and interpreted demonstrating severe bilateral knee osteoarthritis with bone-on-bone contact in medial compartments and varus alignment, dated 2022-08-08.  - Right knee ACL reconstruction was noted.    Impression:  1. Severe Osteoarthritis, left knee (left more symptomatic than right)  2. Severe Osteoarthritis, right knee, status post ACL reconstruction (relatively asymptomatic)  3. Status post Right Total Hip Arthroplasty    Plan:  Surgical options were discussed considering the severity of his arthritis shown in the radiographs. The available treatment paths are ongoing conservative care or knee  reconstruction. At this time, the patient has shown a preference to proceed with knee reconstruction. We discussed total knee arthroplasty in-depth and all queries from the patient were addressed.    We discussed knee reconstruction including the rationale for surgery, the technical aspects of the surgery, the anticipated postoperative course, as well as the risks related to total knee arthroplasty. These risks include but are not limited to infection requiring IV antibiotics, further surgery, and in extreme cases loss of limb. We discussed nerve injury resulting in permanent leg weakness/foot drop, and gait impairment, blood vessel injury requiring further surgery for limb salvage, and in extreme cases loss of limb, bleeding requiring transfusion, iatrogenic femur and/or tibia fracture, knee instability, incomplete relief of symptoms, wound drainage, need for further surgery, post operative stiffness requiring manipulation under anesthesia, reflex sympathetic dystrophy, risks of anesthesia, risk of blood clots in the legs and/or lungs, post-op ileus, risk of exacerbation of underlying medical issues including heart attack, stroke, respiratory failure, kidney failure, liver failure, and even death. The patient understood these risks and may proceed with surgery on an elective basis.      Left TKA Plan:  Medical Optimization: Y  Clearances: PCP    ICD10: M17.12  CPT: [id number]  Anesthesia: Regional, per protocol  Time : 2 hrs  Abx: Ancef  Equipment:ZB Vanguard   DVT: ASA    Follow-up:  - Surgical scheduling for left total knee arthroplasty.

## 2022-08-09 ENCOUNTER — Encounter: Payer: Self-pay | Admitting: Orthopedic Surgery

## 2022-08-10 ENCOUNTER — Telehealth: Payer: Self-pay | Admitting: Primary Care

## 2022-08-10 NOTE — Telephone Encounter (Signed)
Dr. Marciano Sequin, [facility] - Please fax medical clearance and EKG.    Lonie Peak, MD  08/10/2022

## 2022-08-10 NOTE — Telephone Encounter (Signed)
Letter and EKG has been faxed to Dr. Liborio Nixon  Fax: [phone]

## 2022-08-13 DIAGNOSIS — R338 Other retention of urine: Secondary | ICD-10-CM | POA: Diagnosis not present

## 2022-09-11 DIAGNOSIS — K648 Other hemorrhoids: Secondary | ICD-10-CM | POA: Diagnosis not present

## 2022-09-11 DIAGNOSIS — K5730 Diverticulosis of large intestine without perforation or abscess without bleeding: Secondary | ICD-10-CM | POA: Diagnosis not present

## 2022-09-11 DIAGNOSIS — Z86010 Personal history of colonic polyps: Secondary | ICD-10-CM | POA: Diagnosis not present

## 2022-10-01 DIAGNOSIS — Z0000 Encounter for general adult medical examination without abnormal findings: Secondary | ICD-10-CM | POA: Diagnosis not present

## 2022-10-01 DIAGNOSIS — Z13220 Encounter for screening for lipoid disorders: Secondary | ICD-10-CM | POA: Diagnosis not present

## 2022-10-01 DIAGNOSIS — Z125 Encounter for screening for malignant neoplasm of prostate: Secondary | ICD-10-CM | POA: Diagnosis not present

## 2022-10-01 DIAGNOSIS — E559 Vitamin D deficiency, unspecified: Secondary | ICD-10-CM | POA: Diagnosis not present

## 2022-10-10 DIAGNOSIS — Z0000 Encounter for general adult medical examination without abnormal findings: Secondary | ICD-10-CM | POA: Diagnosis not present

## 2022-12-04 ENCOUNTER — Encounter: Payer: Self-pay | Admitting: Orthopedic Surgery

## 2022-12-18 NOTE — Discharge Instructions (Signed)
Pre-Operative Instructions - Total Joint Replacement                FOLLOW YOUR SURGEON'S INSTRUCTIONS IF DIFFERENT THAN BELOW.          When to Arrive for Surgery         On the day prior to your surgery you will find out your arrival time. Staff from the [facility] will call you between 1:30 PM and 4 PM on the day before surgery to inform you of your arrival and surgery times.         Note: Patients scheduled for a procedure on Monday will be assigned an arrival time on the Friday before. Please note surgery start time is approximate. You may want to bring something to help pass the time.        PLEASE ARRIVE ON TIME.        Directions to Surgical Center        [facility]: On the day of your procedure, park in the parking garage and enter through the Main Lobby.  Stop at the Information Desk in the lobby for directions to the Surgery Center on [address].        Eating Guidelines        Follow the instructions below unless otherwise instructed by your physician.        No solid food AFTER MIDNIGHT on the day of your surgery. No candy, gum, mints or chewing tobacco.        You can have clear liquids up to 2 hours before your surgery. This includes only Gatorade, clear apple juice, and water.        Failure to follow these instructions, could lead to a delay or cancellation of your procedure.        Medication Guidelines        On the morning of surgery, take only the medications indicated on the Preoperative Medication List above.        Medications should only be taken with no more than one ounce of water.        Stop taking any non-steroidal anti-inflammatory agents (such as Ibuprofen, Advil, Motrin, Aleve, or Naproxen) for five days prior to surgery (if directed differently in medication chart on previous page please follow those instructions).        Please leave your prescriptions at home with the exception of your inhalers.        Additional Information        THREE NIGHTS BEFORE  SURGERY FOR INFECTION CONTROL  Put clean bedsheets on the first evening only, you do not need to change every night.  We ask you to shower for 3 evenings before your surgery using the 4% Chlorhexidine scrubs the nurse has given you.  Each night take a shower using antibacterial soap (such as Dial) and your normal shampoo/conditioner.  Afterward, while still in the shower, use 4% Chlorhexidine scrub and wash your body from the neck down. DO NOT wash your head, face, eyes and ears with the Chlorhexidine soap. This soap does not lather. Let the soap sit on your skin for 2 minutes. Rinse thoroughly.   Dry off with a clean, fresh towel each evening.  Do not apply lotion after your showers. Deodorant is okay to use. Do not shave below your waist for one week prior to surgery.  After showering, dress in freshly laundered clothes each evening.   If you experience itching or redness on application,   wash it off and do not use it again. Continue with antibacterial soap (such as Dial).  If showering on the morning of surgery follow the same instructions as above: use antibacterial soap followed by Chlorhexidine scrub.       What to bring or wear:        We ask that your cell phone be with you and turned on, so that the Preop and Operating Room nurses may phone you directly with instructions on the morning of surgery if necessary.        Bring Photo ID and insurance information.        Eye glasses and/or hearing aids:  These may be removed prior to surgery so be prepared to leave them with a trusted family member.        Do NOT wear contact lenses.        Wear comfortable, loose fit clothing.          You must arrange a ride home before coming to surgery.        What NOT to bring or wear:        Before coming to the hospital, remove all makeup (including mascara), jewelry (including wedding band and watch), hair accessories and nail polish from toes and fingers.  Do not bring any valuables (money, wallet, purse, jewelry, or  contact lenses.)        Information for After Surgery:      Visiting Hours  Visiting hours are between 8 a.m. and 8 p.m.      Visitation for Patients Admitted to the Hospital    Two visitors, ages 12 or older, are allowed at the bedside at a time on inpatient units. Visitors under the age of 18 must be accompanied by an adult with legal identification.    Exceptions remain in place; visitors should consult the hospital's updated web site before visiting.    Guidelines for Patients Having Surgery   Only 1 visitor may accompany the patient during the pre-surgical process.   The visitors can remain socially distanced in the surgical waiting room.   If the patient is admitted, visitation/exception guidelines will be followed.    For more information regarding the [facility] Visitation Policy, please visit the following website:   [email or web address]          Your family will be notified when your surgery is completed and you have arrived on the patient care unit.        [facility]:        As a convenience, prior to your discharge we will fill any discharge medications you require.      The pharmacy is open from 9am to 5:30pm on weekdays and 10am-2pm on Saturdays.     If you will be alone on the day of surgery and want to leave with your prescribed medication, you may:  Bring a check made out to [facility]  Use a credit card to pay for your prescription  Have your family call the pharmacy with a credit card number  Only bring cash to the hospital as a last resort. Prescriptions cannot be filled without payment. Thank you for your consideration.            Questions:    Please call the [facility] at [phone] between 8:00 a.m. and 4:00 p.m. Monday through Friday.     Any questions regarding specifics about your surgery or recovery? Please call your surgeon's   office.            Surgical Site  Infections FAQs        What is a Surgical Site infection (SSI)?  A surgical site infection is an infection that occurs after surgery In the part of the body where the surgery took place. Most patients who have surgery do not develop an infection. However, Infections develop in about 1 to 3 out of every 100 patients who have surgery.         Some of the common symptoms of a surgical site infection are:    Redness and pain around the area where you had surgery    Drainage of cloudy fluid from your surgical wound    Fever         Can SSIs be treated?   Yes. Most surgical site infections can be treated with antibiotics. The antibiotic given to you depends on the bacteria (germs) causing the Infection. Sometimes patients with SSIs also need another surgery to treat the infection.         What are some of the things that hospitals are doing to prevent SSls?   To prevent SSIs, doctors, nurses, and other healthcare providers:    Clean their hands and arms up to their elbows with an antiseptic agent just before the surgery.    Clean their hands with soap and water or an alcohol-based hand rub before and after caring for each patient.    May remove some of your hair Immediately before your surgery using electric clippers If the hair Is in the same area where the procedure will occur. They should not shave you with a razor.    Wear special hair covers, masks, gowns, and gloves during surgery to keep the surgery area clean.    Give you antibiotics before your surgery starts. in most cases, you should get antibiotics within 60 mInutes before the surgery starts and the antibiotics should be stopped within 24 hours after surgery.    Clean the skin at the site of your surgery with a special soap that kills germs.         What can I do to help prevent SSIs?   Before your surgery:    Tell your doctor about other medical problems you may have. Health problems such as allergies, diabetes, and obesity could affect your surgery and your  treatment.    Quit smoking. Patients who smoke get more Infections. Talk to your doctor about how you can quit before your surgery.    Do not shave near where you will have surgery. Shaving with a razor can Irritate your skin and make it easier to develop an infection.         At the time of your surgery:    Speak up if someone tries to shave you with a razor before surgery. Ask why you need to be shaved and talk with your surgeon if you have any concerns.    Ask if you will get antibiotics before surgery.         After your surgery:    Make sure that your healthcare providers clean their hands before examining you, either with soap and water or an alcohol-based hand rub.   If you do not see your healthcare providers wash their hands,   please ask them to do so.     Family and friends who visit you should not touch the surgical wound or dressings.    Family and friends   should clean their hands with soap and water or an alcohol-based hand rub before and after visiting you. If you do not see them clean their hands, ask them to clean their hands.   What do I need to do when I go home from the hospital?    Before you go home, your doctor or nurse should explain everyt hing you need to know about taking care of your wound. Make sure you understand how to care for your wound before you leave the hospital.    Always clean your hands before and after caring for your wound.    Before you go home, make sure you know who to contact If you have questions or problems after you get home.    If you have any symptoms of an Infection, such as redness and pain at the surgery site, drainage, or fever, call your doctor immediately.   if you have additional questions, Please ask your doctor or nurse.          You were seen today by Teyana Pierron, FNP

## 2022-12-19 NOTE — Anesthesia Preprocedure Evaluation (Addendum)
Anesthesia Pre-operative History and Physical for Timothy Cross  History and Physical Performed at [facility]    Electrophysiology/AICD/Pacer:  04/19/22 EKG: HR 49, sinus bradycardia, similar to previous on 04/14/21    .  [facility] Summary:  Timothy Cross presents preoperatively for anesthesia evaluation prior to LEFT ARTHROPLASTY, KNEE, TOTAL (Left: Knee) on 01/07/23 with Dr. Ardine Eng.    He has a medical history significant for:  Primary osteoarthritis of left knee  BMI 20.57  HLD - no medication  GERD - tums prn  Left cervical radiculopathy  Prostate cancer s/p brachytherapy in 2019 - follows with Dr. Lisabeth Pick, no evidence of reoccurrence   Anxiety   Rib fracture on left side in 2023 - patient has occasional discomfort with certain positions, no active chest pain or SOB    The patient has no dyspnea, denies chest pain, climbs stairs, and can lie flat.     Perioperative Cardiac Risk:  0.4%-1.0%By Alizandra Loh Thane Edu, MBBS at 7:09 AM on 01/07/2023    Anesthesia Evaluation Information Source: records, patient     ANESTHESIA HISTORY     Denies anesthesia history    GENERAL  Pertinent (-):  No infection    HEENT    + Visual Impairment          corrective lens for ADL    + Sinus Issues            allergic rhinitis  Pertinent (-):  No glaucoma, hearing loss, TMJD or neck pain PULMONARY  Pertinent(-):  No smoking, asthma, restrictive lung disease, shortness of breath, recent URI, cough/congestion, currently active environmental allergies, snoring, sleep apnea or COPD    CARDIOVASCULAR  Good(4+METs) Exercise Tolerance  Pertinent(-):  No hypertension, past MI, angina, CAD, coronary intervention, valvular heart disease, anticoagulants/antiplatelet medications, dysrhythmias, CHF, orthopnea or hx of DVT    Comment: Able to climb 1 FOS without chest pain or SOB     GI/HEPATIC/RENAL   NPO: > 8hrs ago (solids) and > 2hrs ago (clears)      + GERD          well controlled    + Alcohol use          social  Pertinent(-):  No PUD, liver  issues,  bowel issues, renal issues, urinary issues or  esophageal issues   Comment: Prostate cancer s/p brachytherapy in 2019   NEURO/PSYCH/ORTHO    + Chronic pain (hips and knees)          lower back    + Neuropsychiatric Issues          anxiety    + Peripheral Nerve Issue (Left cervical radiculopathy)  Pertinent(-):  No headaches, dizziness/motion sickness, syncope, seizures, cerebrovascular event, neuromuscular disease or gait/mobility issues    ENDO/OTHER  Pertinent(-):  No diabetes mellitus, thyroid disease, hormone use, steroid use, chemo Hx    HEMATOLOGIC    + Blood dyscrasia          hyperlipidemia    + Arthritis          hips and knees  Pertinent(-):  No bruising/bleeding easily, anticoagulants/antiplatelet medications, blood transfusion or autoimmune disease       Physical Exam    Airway            Mouth opening: normal            Mallampati: II            TM distance (fb): >3 FB  Neck ROM: full  Dental   Normal Exam   Comment: No dentures or partials, no loose teeth per patient   Cardiovascular           Rhythm: regular           Rate: normal  No peripheral edema or murmur        General Survey    No rashes, wounds   Comment: Denies wounds or open areas of skin. Resolving poison ivy on left ankle (very mild, two or three spots left).    Pulmonary     breath sounds clear to auscultation    No cough, rhonchi, wheezes    Mental Status     oriented to person, place and time     Patient Education from [facility] Provider:  Patient Education:  The following items were discussed with Timothy Cross to his satisfaction and comprehension:  The facility's NPO guidelines were discussed  To call the surgeon if he becomes ill prior to surgery  All questions were answered  Medications DOS with sip of water: see AVS   Hold medications AM day of surgery: see AVS  Nurse reviewed additional items as indicated in the education record.  Timothy Cross verbalized knowledge and teaching objectives met.  No barriers to  learning identified.  Teaching sheet reviewed with patient/family.  IV insertion was reviewed with him.  The importance of coughing and deep breathing was emphasized.  VISHRUTH SEOANE was instructed to hold ASA/NSAIDS 5 days before surgery.  Chlohexidine scrub instructions reviewed.    ________________________________________________________________________  PLAN  ASA Score  2  Anesthetic Plan spinal      Protocols/Care Pathways (Total Joint Arthroplasty) Induction (routine IV) General Anesthesia/Sedation Maintenance Plan (inhaled agents);  Airway Manipulation (none); Airway (LMA); Line ( use current access); Monitoring (standard ASA); Positioning (supine); PONV Plan (dexamethasone, ondansetron and no N2O); PostOp (PACU)Standard Attestation    Informed Consent     Risks:          Risks discussed were commensurate with the plan listed above with the following specific points: N/V, aspiration, sore throat, hypotension, headache, failed block, dizziness, unsteadiness and emergence delirium, Damage to: teeth, nerves, eyes and blood vessels, unexpected serious injury and allergic Rx.    Anesthetic Consent:         Anesthetic plan (and risks as noted above) were discussed with patient    Blood products Consent:        Use of blood products discussed with: patient and they consented    Plan also discussed with team members including:       attending    Responsible Anesthesia Provider Attestation:  I attest that the patient or proxy understands and accepts the risks and benefits of the anesthesia plan. I also attest that I have personally performed a pre-anesthetic examination and evaluation, and prescribed the anesthetic plan for this particular location within 48 hours prior to the anesthetic as documented. Verbie Babic Thane Edu, MBBS  01/07/23, 7:09 AM

## 2022-12-20 ENCOUNTER — Other Ambulatory Visit
Admission: RE | Admit: 2022-12-20 | Discharge: 2022-12-20 | Disposition: A | Discharge status: Home / Self Care | Payer: No Typology Code available for payment source | Source: Ambulatory Visit | Attending: Orthopedic Surgery | Admitting: Orthopedic Surgery

## 2022-12-20 ENCOUNTER — Encounter: Payer: Self-pay | Admitting: Orthopedic Surgery

## 2022-12-20 ENCOUNTER — Ambulatory Visit
Admission: RE | Admit: 2022-12-20 | Discharge: 2022-12-20 | Disposition: A | Discharge status: Home / Self Care | Payer: No Typology Code available for payment source | Source: Ambulatory Visit

## 2022-12-20 ENCOUNTER — Other Ambulatory Visit: Payer: Self-pay

## 2022-12-20 ENCOUNTER — Ambulatory Visit
Admit: 2022-12-20 | Discharge: 2022-12-20 | Disposition: A | Discharge status: Home / Self Care | Payer: No Typology Code available for payment source

## 2022-12-20 ENCOUNTER — Ambulatory Visit
Admission: RE | Admit: 2022-12-20 | Discharge: 2022-12-20 | Disposition: A | Discharge status: Home / Self Care | Payer: No Typology Code available for payment source | Source: Ambulatory Visit | Attending: Orthopedic Surgery | Admitting: Orthopedic Surgery

## 2022-12-20 VITALS — BP 124/71 | HR 60 | Temp 97.3°F | Resp 18 | Ht 72.0 in | Wt 151.7 lb

## 2022-12-20 DIAGNOSIS — M1712 Unilateral primary osteoarthritis, left knee: Secondary | ICD-10-CM

## 2022-12-20 DIAGNOSIS — Z01818 Encounter for other preprocedural examination: Secondary | ICD-10-CM

## 2022-12-20 DIAGNOSIS — Z01812 Encounter for preprocedural laboratory examination: Secondary | ICD-10-CM | POA: Insufficient documentation

## 2022-12-20 LAB — COMPREHENSIVE METABOLIC PANEL
ALT: 10 U/L (ref 0–50)
AST: 17 U/L (ref 0–50)
Albumin: 4.5 g/dL (ref 3.5–5.2)
Alk Phos: 56 U/L (ref 40–130)
Anion Gap: 10 (ref 7–16)
Bilirubin,Total: 0.4 mg/dL (ref 0.0–1.2)
CO2: 27 mmol/L (ref 20–28)
Calcium: 9.7 mg/dL (ref 8.6–10.2)
Chloride: 105 mmol/L (ref 96–108)
Creatinine: 1.04 mg/dL (ref 0.67–1.17)
Glucose: 85 mg/dL (ref 60–99)
Lab: 21 mg/dL — ABNORMAL HIGH (ref 6–20)
Potassium: 4.4 mmol/L (ref 3.3–5.1)
Sodium: 142 mmol/L (ref 133–145)
Total Protein: 6.9 g/dL (ref 6.3–7.7)
eGFR BY CREAT: 81 *

## 2022-12-20 LAB — CBC
Hematocrit: 43 % (ref 37–52)
Hemoglobin: 13.8 g/dL (ref 12.0–17.0)
MCV: 90 fL (ref 75–100)
Platelets: 172 10*3/uL (ref 150–450)
RBC: 4.8 MIL/uL (ref 4.0–6.0)
RDW: 12.9 % (ref 0.0–15.0)
WBC: 5.8 10*3/uL (ref 3.5–11.0)

## 2022-12-20 LAB — PROTIME-INR
INR: 1 (ref 0.9–1.1)
Protime: 10.9 s (ref 10.0–12.9)

## 2022-12-20 LAB — MSSA/MRSA NAAT
MRSA nasal NAAT (PCR): NEGATIVE
MSSA Nasal NAAT (PCR): POSITIVE — AB

## 2022-12-20 LAB — HEMOGLOBIN A1C: Hemoglobin A1C: 5.2 %

## 2022-12-20 LAB — APTT: aPTT: 30.7 s (ref 25.8–37.9)

## 2022-12-21 LAB — TYPE AND SCREEN
ABO RH Blood Type: B POS
Antibody Screen: NEGATIVE

## 2022-12-27 ENCOUNTER — Encounter: Payer: Self-pay | Admitting: Primary Care

## 2022-12-28 ENCOUNTER — Telehealth: Payer: Self-pay | Admitting: Primary Care

## 2022-12-28 NOTE — Telephone Encounter (Signed)
Patient needs an updated PreOperative Clearance for scheduled total knee arthroplasty on 01/07/2023.    Timothy Officer, MD  12/28/2022

## 2022-12-28 NOTE — Telephone Encounter (Signed)
Patient called back, scheduled preop appt  7/23 8am with Marchelle Folks

## 2022-12-28 NOTE — Telephone Encounter (Signed)
Left a voicemail asking patient to call back. Please schedule Pre Op per message below. Needs to be ASAP for procedure later this month.

## 2023-01-01 ENCOUNTER — Ambulatory Visit: Payer: No Typology Code available for payment source | Attending: Primary Care

## 2023-01-01 ENCOUNTER — Other Ambulatory Visit: Payer: Self-pay

## 2023-01-01 VITALS — BP 112/70 | HR 50 | Temp 97.9°F | Ht 72.0 in | Wt 151.0 lb

## 2023-01-01 DIAGNOSIS — K219 Gastro-esophageal reflux disease without esophagitis: Secondary | ICD-10-CM

## 2023-01-01 DIAGNOSIS — Z8546 Personal history of malignant neoplasm of prostate: Secondary | ICD-10-CM

## 2023-01-01 DIAGNOSIS — E785 Hyperlipidemia, unspecified: Secondary | ICD-10-CM

## 2023-01-01 DIAGNOSIS — Z01818 Encounter for other preprocedural examination: Secondary | ICD-10-CM

## 2023-01-01 NOTE — Comprehensive Assessment (Signed)
01/01/23 0847   Contact Information   Spokesperson Name Holly/Vicki   Discharge transportation   Ride to/from facility [facility]        01/01/23 0848   Demographics   [place] Residence [place]   Ethnicity/Race Caucasian   Primary Language English   Primary Care Taker of? No one   Risk Factors   Risk Factors Adjustment to Dx/Injury/Illness   Living Situation   Lives With Alone-Holly ([place])coming into town for a few weeks   Can they assist patient after discharge? Yes   Home Geography   Type of [facility]   # Of Steps In Home 0   # Steps to Enter Home 2   Bedroom First floor   Bathroom First floor - full   Utilitites Working Yes   Palliative Care Assessment   Person assessed Patient   Coping Status Appropriate to Stressor   Current Goal of Care Curative   Baseline ADL functioning   Transfers Independent   Ambulation Independent   Assistive Device none   Bathing/Grooming Independent   Meal Prep Independent   Able to feed self? Yes   Household maintenance/chores Independent   Able to drive? Yes   Home Care Services   Current Home Equipment Toilet seat (raised w/arms);Walker (rolling);Shower chair;Sock aid;Long handled shoe horn     Spoke with pt to review d/c plan after upcoming surgery, chart reviewed.  SW reviewed home care and options, selected [facility] as he had them in the past, referral initiated today. Pt has no concern for d/c to home with support from family.    Fernand Parkins MSW  Pager: [phone]

## 2023-01-01 NOTE — Progress Notes (Signed)
Pre-Operative Evaluation   01/01/2023     Chief Complaint: Pre-op Exam    SUBJECTIVE:   Timothy Cross is a 62 y.o. male who presents today for a follow up visit for pre-operative risk stratification evaluation in anticipation for a planned L TKA by Dr. Delsa Bern on 01/07/23    Preoperative medical evaluation questions for a healthy patient:  1. Have you ever had surgery before? Yes If yes what kind? Full hip replacement and number of knee surgeries, prostate seeds implanted.   2. If you have been put to sleep for an operation where there any anaesthetic problems? No  3. Has anyone in your family (blood relatives) had a problem following an anaesthetic? Not to his knowledge  4. Do you usually get chest pain or breathlessness when you climb up two flights of stairs at normal speed? No  5. Do you exercise? Yes, pickle ball, walking, bike riding  6. Do you have kidney disease? No  7. Have you ever had a heart attack? No  8. Have you ever been diagnosed with an irregular heartbeat? No  9. Have you ever had a stroke? Np  10. Do you suffer from epilepsy or seizures? No  11. Do you have any problems with pain, stiffness or arthritis in your neck or jaw?  No  12. Do you have thyroid disease? No  13. Do you suffer from angina? No  14. Do you have liver disease? No  15. Have you ever been diagnosed with heart failure? No  16. Do you suffer from asthma? No  17. Do you have diabetes that requires oral medication? No  18. Do you have diabetes that requires insulin? No  19. Do you suffer from bronchitis? No  20. Do you smoke or do you have a history of smoking? No  21. Do you use any illegal substances? No  22. Do you drink alcohol? Yes, on the weekend, 2-3 beers on one day.  23. Have you every had a DVT? No  24. Are you independent with ADLs? Yes  25. Do you snore, ever had witnessed episodes of apnea or been diagnosed OSA? He snores if sleeping on his back, no apnea or OSA  26. Any pain or difficulty with chewing or swallowing?  No    Review of Systems   Constitutional:  Negative for chills, fever and malaise/fatigue.   HENT:  Negative for congestion, sinus pain and sore throat.    Respiratory:  Negative for cough, sputum production, shortness of breath and wheezing.    Cardiovascular:  Negative for chest pain, palpitations and leg swelling.   Gastrointestinal:  Negative for abdominal pain, constipation, diarrhea, nausea and vomiting.   Genitourinary:  Negative for frequency and urgency.   Musculoskeletal:  Negative for back pain, joint pain, myalgias and neck pain.   Neurological:  Negative for dizziness and headaches.     Medications reviewed in eRecord today and confirmed with the patient.  Allergies   Allergen Reactions    Environmental Allergies Other (See Comments)     Pollen - sneezing, hay fever    Poison Ivy Extract Hives     OBJECTIVE:   VITALS:  BP 112/70   Pulse 50   Temp 36.6 C (97.9 F) (Temporal)   Ht 1.829 m (6')   Wt 68.5 kg (151 lb)   SpO2 99%   BMI 20.48 kg/m   Physical Exam  Constitutional:       General: He is not in acute distress.  Appearance: He is not ill-appearing.   HENT:      Head: Normocephalic and atraumatic.      Right Ear: Tympanic membrane, ear canal and external ear normal. There is no impacted cerumen.      Left Ear: Tympanic membrane, ear canal and external ear normal. There is no impacted cerumen.      Nose: No congestion or rhinorrhea.      Mouth/Throat:      Mouth: Mucous membranes are moist.      Pharynx: Oropharynx is clear. No oropharyngeal exudate or posterior oropharyngeal erythema.   Eyes:      General:         Right eye: No discharge.         Left eye: No discharge.      Conjunctiva/sclera: Conjunctivae normal.   Cardiovascular:      Rate and Rhythm: Normal rate and regular rhythm.      Pulses: Normal pulses.      Heart sounds: Normal heart sounds. No murmur heard.  Pulmonary:      Effort: Pulmonary effort is normal. No respiratory distress.      Breath sounds: Normal breath sounds.  No stridor. No wheezing, rhonchi or rales.   Chest:      Chest wall: No tenderness.   Abdominal:      General: Abdomen is flat. Bowel sounds are normal. There is no distension.      Palpations: There is no mass.      Tenderness: There is no abdominal tenderness. There is no right CVA tenderness, left CVA tenderness, guarding or rebound.      Hernia: No hernia is present.   Musculoskeletal:         General: Normal range of motion.      Cervical back: No tenderness.   Lymphadenopathy:      Cervical: No cervical adenopathy.   Skin:     General: Skin is warm and dry.   Neurological:      General: No focal deficit present.      Mental Status: He is alert and oriented to person, place, and time. Mental status is at baseline.       EKG: normal EKG, normal sinus rhythm, unchanged from previous tracings.        ASSESSMENT/PLAN:   Pre-op exam  All of the patients chronic medical conditions are well managed and at his baseline. He is cleared for surgery from a primary care standpoint.     Hyperlipidemia  LDL was last checked on 04/03/2022 and was 97 mg/dL which is at goal of <454    Medication regimen as of TODAY is as follows:   Managed with diet and lifestyle.    Gastroesophageal reflux disease without esophagitis  Well managed with diet and lifestyle. Takes an occasional Tum.    History of prostate cancer  No evidence of recurrence, continues to follow with Urology and Radiation Oncology     History of Rib Fracture  No evidence of continued concern or impact to breathing. Resolved.    Medications/Orders:     Orders Placed This Encounter    EKG 12 lead - (During Visit)      Follow up:   Follow up for Yearly Check-up., sooner if needed.    Chapman Fitch, NP  01/01/23

## 2023-01-07 ENCOUNTER — Other Ambulatory Visit: Payer: Self-pay

## 2023-01-07 ENCOUNTER — Inpatient Hospital Stay: Payer: No Typology Code available for payment source

## 2023-01-07 ENCOUNTER — Encounter
Admission: RE | Disposition: A | Discharge status: Home Health | Payer: Self-pay | Source: Ambulatory Visit | Attending: Orthopedic Surgery

## 2023-01-07 ENCOUNTER — Inpatient Hospital Stay
Admission: RE | Admit: 2023-01-07 | Discharge: 2023-01-08 | Disposition: A | Discharge status: Home Health | Payer: No Typology Code available for payment source | Source: Ambulatory Visit | Attending: Orthopedic Surgery | Admitting: Orthopedic Surgery

## 2023-01-07 ENCOUNTER — Inpatient Hospital Stay
Payer: No Typology Code available for payment source | Admitting: Student in an Organized Health Care Education/Training Program

## 2023-01-07 DIAGNOSIS — M1712 Unilateral primary osteoarthritis, left knee: Secondary | ICD-10-CM | POA: Insufficient documentation

## 2023-01-07 DIAGNOSIS — Z9889 Other specified postprocedural states: Secondary | ICD-10-CM

## 2023-01-07 DIAGNOSIS — Z96652 Presence of left artificial knee joint: Secondary | ICD-10-CM

## 2023-01-07 DIAGNOSIS — M25562 Pain in left knee: Secondary | ICD-10-CM

## 2023-01-07 DIAGNOSIS — M24562 Contracture, left knee: Secondary | ICD-10-CM | POA: Insufficient documentation

## 2023-01-07 DIAGNOSIS — M25762 Osteophyte, left knee: Secondary | ICD-10-CM | POA: Insufficient documentation

## 2023-01-07 DIAGNOSIS — K219 Gastro-esophageal reflux disease without esophagitis: Secondary | ICD-10-CM | POA: Insufficient documentation

## 2023-01-07 DIAGNOSIS — G8918 Other acute postprocedural pain: Secondary | ICD-10-CM

## 2023-01-07 DIAGNOSIS — M21162 Varus deformity, not elsewhere classified, left knee: Secondary | ICD-10-CM | POA: Insufficient documentation

## 2023-01-07 HISTORY — PX: PR ARTHRP KNE CONDYLE&PLATU MEDIAL&LAT COMPARTMENTS: 27447

## 2023-01-07 LAB — BASIC METABOLIC PANEL
Anion Gap: 8 (ref 7–16)
CO2: 27 mmol/L (ref 20–28)
Calcium: 9 mg/dL (ref 8.6–10.2)
Chloride: 101 mmol/L (ref 96–108)
Creatinine: 0.97 mg/dL (ref 0.67–1.17)
Glucose: 149 mg/dL — ABNORMAL HIGH (ref 60–99)
Lab: 18 mg/dL (ref 6–20)
Potassium: 4.7 mmol/L (ref 3.3–5.1)
Sodium: 136 mmol/L (ref 133–145)
eGFR BY CREAT: 88 *

## 2023-01-07 LAB — POCT GLUCOSE: Glucose POCT: 90 mg/dL (ref 60–99)

## 2023-01-07 LAB — HCT AND HGB
Hematocrit: 40 % (ref 37–52)
Hemoglobin: 13.2 g/dL (ref 12.0–17.0)

## 2023-01-07 SURGERY — ARTHROPLASTY, KNEE, TOTAL
Anesthesia: Spinal | Site: Knee | Laterality: Left | Wound class: Clean

## 2023-01-07 MED ORDER — DEXTROSE 5 % FLUSH FOR PUMPS *I*
0.0000 mL/h | INTRAVENOUS | Status: DC | PRN
Start: 2023-01-07 — End: 2023-01-08

## 2023-01-07 MED ORDER — OXYCODONE HCL 5 MG PO TABS *I*
10.0000 mg | ORAL_TABLET | ORAL | Status: DC | PRN
Start: 2023-01-07 — End: 2023-01-08

## 2023-01-07 MED ORDER — MORPHINE SULFATE ER 15 MG PO TBCR *I*
15.0000 mg | ORAL_TABLET | Freq: Two times a day (BID) | ORAL | Status: DC
Start: 2023-01-07 — End: 2023-01-08
  Administered 2023-01-07 – 2023-01-08 (×2): 15 mg via ORAL
  Filled 2023-01-07 (×2): qty 1

## 2023-01-07 MED ORDER — MIDAZOLAM HCL 1 MG/ML IJ SOLN *I* WRAPPED
INTRAMUSCULAR | Status: AC
Start: 2023-01-07 — End: 2023-01-07
  Filled 2023-01-07: qty 2

## 2023-01-07 MED ORDER — ACETAMINOPHEN 500 MG PO TABS *I*
1000.0000 mg | ORAL_TABLET | Freq: Three times a day (TID) | ORAL | Status: DC
Start: 2023-01-07 — End: 2023-01-08
  Administered 2023-01-07 – 2023-01-08 (×4): 1000 mg via ORAL
  Filled 2023-01-07 (×4): qty 2

## 2023-01-07 MED ORDER — SODIUM CHLORIDE 0.9 % FLUSH FOR PUMPS *I*
0.0000 mL/h | INTRAVENOUS | Status: DC | PRN
Start: 2023-01-07 — End: 2023-01-08

## 2023-01-07 MED ORDER — SENNOSIDES 8.6 MG PO TABS *I*
2.0000 | ORAL_TABLET | Freq: Every evening | ORAL | 0 refills | Status: DC
Start: 2023-01-07 — End: 2023-02-19
  Filled 2023-01-07: qty 100, 50d supply, fill #0

## 2023-01-07 MED ORDER — FENTANYL CITRATE 50 MCG/ML IJ SOLN *WRAPPED*
INTRAMUSCULAR | Status: AC
Start: 2023-01-07 — End: 2023-01-07
  Filled 2023-01-07: qty 2

## 2023-01-07 MED ORDER — MIDAZOLAM HCL 1 MG/ML IJ SOLN *I* WRAPPED
INTRAMUSCULAR | Status: DC | PRN
Start: 2023-01-07 — End: 2023-01-07
  Administered 2023-01-07: 2 mg via INTRAVENOUS

## 2023-01-07 MED ORDER — TRAZODONE HCL 50 MG PO TABS *I*
50.0000 mg | ORAL_TABLET | Freq: Every evening | ORAL | Status: DC | PRN
Start: 2023-01-07 — End: 2023-01-08
  Filled 2023-01-07: qty 1

## 2023-01-07 MED ORDER — ACETAMINOPHEN 325 MG PO TABS *I*
975.0000 mg | ORAL_TABLET | Freq: Once | ORAL | Status: AC
Start: 2023-01-07 — End: 2023-01-07
  Administered 2023-01-07: 975 mg via ORAL
  Filled 2023-01-07: qty 3

## 2023-01-07 MED ORDER — GABAPENTIN 300 MG PO CAPSULE *I*
300.0000 mg | ORAL_CAPSULE | Freq: Every evening | ORAL | Status: DC
Start: 2023-01-07 — End: 2023-01-08
  Administered 2023-01-07: 300 mg via ORAL
  Filled 2023-01-07: qty 1

## 2023-01-07 MED ORDER — CEFAZOLIN 2000 MG IN STERILE WATER 20ML SYRINGE *I*
2000.0000 mg | PREFILLED_SYRINGE | Freq: Once | INTRAVENOUS | Status: AC
Start: 2023-01-07 — End: 2023-01-07
  Administered 2023-01-07: 2000 mg via INTRAVENOUS
  Filled 2023-01-07: qty 20

## 2023-01-07 MED ORDER — EPHEDRINE 5MG/ML IN NS IV/IJ *WRAPPED*
INTRAMUSCULAR | Status: AC
Start: 2023-01-07 — End: 2023-01-07
  Filled 2023-01-07: qty 5

## 2023-01-07 MED ORDER — ONDANSETRON HCL 2 MG/ML IV SOLN *I*
INTRAMUSCULAR | Status: DC | PRN
Start: 2023-01-07 — End: 2023-01-07
  Administered 2023-01-07: 4 mg via INTRAVENOUS

## 2023-01-07 MED ORDER — TRANEXAMIC ACID 1000 MG / 0.7% NACL 100 ML IVPB *I*
1000.0000 mg | Freq: Once | INTRAVENOUS | Status: AC
Start: 2023-01-07 — End: 2023-01-07
  Administered 2023-01-07: 1000 mg via INTRAVENOUS
  Filled 2023-01-07: qty 100

## 2023-01-07 MED ORDER — ONDANSETRON HCL 2 MG/ML IV SOLN *I*
INTRAMUSCULAR | Status: AC
Start: 2023-01-07 — End: 2023-01-07
  Filled 2023-01-07: qty 2

## 2023-01-07 MED ORDER — CEFAZOLIN 2000 MG IN STERILE WATER 20ML SYRINGE *I*
2000.0000 mg | PREFILLED_SYRINGE | Freq: Three times a day (TID) | INTRAVENOUS | Status: AC
Start: 2023-01-07 — End: 2023-01-07
  Administered 2023-01-07 (×2): 2000 mg via INTRAVENOUS
  Filled 2023-01-07 (×2): qty 20

## 2023-01-07 MED ORDER — ONDANSETRON HCL 2 MG/ML IV SOLN *I*
8.0000 mg | Freq: Once | INTRAMUSCULAR | Status: DC | PRN
Start: 2023-01-07 — End: 2023-01-07

## 2023-01-07 MED ORDER — SODIUM CHLORIDE 0.9 % IV SOLN WRAPPED *I*
20.0000 mL/h | Status: DC
Start: 2023-01-07 — End: 2023-01-07

## 2023-01-07 MED ORDER — FENTANYL CITRATE 50 MCG/ML IJ SOLN *WRAPPED*
Freq: Once | INTRAMUSCULAR | Status: AC | PRN
Start: 2023-01-07 — End: 2023-01-07
  Administered 2023-01-07: 50 ug via INTRAVENOUS

## 2023-01-07 MED ORDER — FAMOTIDINE 20 MG PO TABS *I*
20.0000 mg | ORAL_TABLET | Freq: Two times a day (BID) | ORAL | 0 refills | Status: DC
Start: 2023-01-07 — End: 2023-02-19
  Filled 2023-01-07: qty 60, 30d supply, fill #0

## 2023-01-07 MED ORDER — DOCUSATE SODIUM 100 MG PO CAPS *I*
200.0000 mg | ORAL_CAPSULE | Freq: Every day | ORAL | Status: DC
Start: 2023-01-07 — End: 2023-01-08
  Administered 2023-01-07 – 2023-01-08 (×2): 200 mg via ORAL
  Filled 2023-01-07 (×2): qty 2

## 2023-01-07 MED ORDER — MELOXICAM 7.5 MG PO TABS *I*
15.0000 mg | ORAL_TABLET | Freq: Every day | ORAL | Status: DC
Start: 2023-01-08 — End: 2023-01-08
  Administered 2023-01-08: 15 mg via ORAL
  Filled 2023-01-07: qty 2

## 2023-01-07 MED ORDER — HYDROMORPHONE HCL PF 1 MG/ML IJ SOLN *WRAPPED*
0.5000 mg | INTRAMUSCULAR | Status: DC | PRN
Start: 2023-01-07 — End: 2023-01-07

## 2023-01-07 MED ORDER — BUPIVACAINE-EPINEPHRINE 0.25 % IJ SOLUTION *WRAPPED*
INTRAMUSCULAR | Status: AC
Start: 2023-01-07 — End: 2023-01-07
  Filled 2023-01-07: qty 60

## 2023-01-07 MED ORDER — KETOROLAC TROMETHAMINE 30 MG/ML IJ SOLN *I*
INTRAMUSCULAR | Status: DC | PRN
Start: 2023-01-07 — End: 2023-01-07
  Administered 2023-01-07: 61 mL via INTRA_ARTICULAR

## 2023-01-07 MED ORDER — SODIUM CHLORIDE 0.9 % IV SOLN WRAPPED *I*
125.0000 mL/h | Status: DC
Start: 2023-01-07 — End: 2023-01-08
  Administered 2023-01-07: 125 mL/h via INTRAVENOUS

## 2023-01-07 MED ORDER — CHLORHEXIDINE GLUCONATE 0.12 % MT SOLN *I*
15.0000 mL | Freq: Once | OROMUCOSAL | Status: AC
Start: 2023-01-07 — End: 2023-01-07
  Administered 2023-01-07: 15 mL via OROMUCOSAL

## 2023-01-07 MED ORDER — BUPIVACAINE-EPINEPHRINE 0.25 % IJ SOLUTION *WRAPPED*
INTRAMUSCULAR | Status: DC | PRN
Start: 2023-01-07 — End: 2023-01-07
  Administered 2023-01-07: 20 mL via PERINEURAL

## 2023-01-07 MED ORDER — SENNOSIDES 8.6 MG PO TABS *I*
2.0000 | ORAL_TABLET | Freq: Every evening | ORAL | Status: DC
Start: 2023-01-07 — End: 2023-01-08
  Administered 2023-01-07: 2 via ORAL
  Filled 2023-01-07: qty 2

## 2023-01-07 MED ORDER — PROPOFOL 10 MG/ML IV EMUL (INTERMITTENT DOSING) WRAPPED *I*
INTRAVENOUS | Status: DC | PRN
Start: 2023-01-07 — End: 2023-01-07
  Administered 2023-01-07: 130 mg via INTRAVENOUS

## 2023-01-07 MED ORDER — DEXAMETHASONE SODIUM PHOSPHATE 4 MG/ML INJ SOLN *WRAPPED*
INTRAMUSCULAR | Status: DC | PRN
Start: 2023-01-07 — End: 2023-01-07
  Administered 2023-01-07: 4 mg via INTRAVENOUS

## 2023-01-07 MED ORDER — EPHEDRINE 5MG/ML IN NS IV/IJ *WRAPPED*
INTRAMUSCULAR | Status: DC | PRN
Start: 2023-01-07 — End: 2023-01-07
  Administered 2023-01-07: 10 mg via INTRAVENOUS

## 2023-01-07 MED ORDER — FAMOTIDINE 20 MG PO TABS *I*
20.0000 mg | ORAL_TABLET | Freq: Two times a day (BID) | ORAL | Status: DC
Start: 2023-01-07 — End: 2023-01-08
  Administered 2023-01-07 – 2023-01-08 (×2): 20 mg via ORAL
  Filled 2023-01-07 (×2): qty 1

## 2023-01-07 MED ORDER — LACTATED RINGERS IV SOLN *I*
20.0000 mL/h | INTRAVENOUS | Status: DC
Start: 2023-01-07 — End: 2023-01-07
  Administered 2023-01-07: 20 mL/h via INTRAVENOUS

## 2023-01-07 MED ORDER — CALCIUM CARBONATE ANTACID 500 MG PO CHEW *I*
1000.0000 mg | CHEWABLE_TABLET | Freq: Three times a day (TID) | ORAL | Status: DC | PRN
Start: 2023-01-07 — End: 2023-01-08

## 2023-01-07 MED ORDER — HALOPERIDOL LACTATE 5 MG/ML IJ SOLN *I*
0.5000 mg | Freq: Once | INTRAMUSCULAR | Status: DC | PRN
Start: 2023-01-07 — End: 2023-01-07

## 2023-01-07 MED ORDER — LACTATED RINGERS IV SOLN *I*
125.0000 mL/h | INTRAVENOUS | Status: DC
Start: 2023-01-07 — End: 2023-01-07
  Administered 2023-01-07: 125 mL/h via INTRAVENOUS

## 2023-01-07 MED ORDER — ACETAMINOPHEN 500 MG PO TABS *I*
1000.0000 mg | ORAL_TABLET | Freq: Three times a day (TID) | ORAL | 0 refills | Status: DC | PRN
Start: 2023-01-07 — End: 2023-03-04
  Filled 2023-01-07: qty 100, 16d supply, fill #0

## 2023-01-07 MED ORDER — BUPIVACAINE IN DEXTROSE 0.75-8.25 % IT SOLN *I*
INTRATHECAL | Status: DC | PRN
Start: 2023-01-07 — End: 2023-01-07
  Administered 2023-01-07: 1.4 mL via INTRATHECAL

## 2023-01-07 MED ORDER — PROPOFOL 10 MG/ML IV EMUL (INTERMITTENT DOSING) WRAPPED *I*
INTRAVENOUS | Status: AC
Start: 2023-01-07 — End: 2023-01-07
  Filled 2023-01-07: qty 20

## 2023-01-07 MED ORDER — KETOROLAC TROMETHAMINE 30 MG/ML IJ SOLN *I*
INTRAMUSCULAR | Status: AC
Start: 2023-01-07 — End: 2023-01-07
  Filled 2023-01-07: qty 1

## 2023-01-07 MED ORDER — LIDOCAINE HCL 2 % IJ SOLN *I*
INTRAMUSCULAR | Status: DC | PRN
Start: 2023-01-07 — End: 2023-01-07
  Administered 2023-01-07: 60 mg via INTRAVENOUS

## 2023-01-07 MED ORDER — LIDOCAINE HCL 2 % (PF) IJ SOLN *I*
INTRAMUSCULAR | Status: AC
Start: 2023-01-07 — End: 2023-01-07
  Filled 2023-01-07: qty 5

## 2023-01-07 MED ORDER — LIDOCAINE HCL 1 % IJ SOLN *I*
INTRAMUSCULAR | Status: DC | PRN
Start: 2023-01-07 — End: 2023-01-07
  Administered 2023-01-07: 1 mL via SUBCUTANEOUS

## 2023-01-07 MED ORDER — FENTANYL CITRATE 50 MCG/ML IJ SOLN *WRAPPED*
INTRAMUSCULAR | Status: DC | PRN
Start: 2023-01-07 — End: 2023-01-07
  Administered 2023-01-07 (×2): 50 ug via INTRAVENOUS

## 2023-01-07 MED ORDER — LUBIPROSTONE 24 MCG PO CAPS *I*
24.0000 ug | ORAL_CAPSULE | Freq: Two times a day (BID) | ORAL | Status: DC
Start: 2023-01-07 — End: 2023-01-08
  Administered 2023-01-07 – 2023-01-08 (×2): 24 ug via ORAL
  Filled 2023-01-07 (×2): qty 1

## 2023-01-07 MED ORDER — CELECOXIB 200 MG PO CAPS *I*
200.0000 mg | ORAL_CAPSULE | Freq: Once | ORAL | Status: AC
Start: 2023-01-07 — End: 2023-01-07
  Administered 2023-01-07: 200 mg via ORAL
  Filled 2023-01-07: qty 1

## 2023-01-07 MED ORDER — POVIDONE-IODINE 5 % EX SOLN *I*
Freq: Once | CUTANEOUS | Status: AC
Start: 2023-01-07 — End: 2023-01-07

## 2023-01-07 MED ORDER — ONDANSETRON HCL 2 MG/ML IV SOLN *I*
4.0000 mg | Freq: Four times a day (QID) | INTRAMUSCULAR | Status: DC | PRN
Start: 2023-01-07 — End: 2023-01-08
  Filled 2023-01-07: qty 2

## 2023-01-07 MED ORDER — ASPIRIN 81 MG PO TBEC *I*
81.0000 mg | DELAYED_RELEASE_TABLET | Freq: Two times a day (BID) | ORAL | 0 refills | Status: AC
Start: 2023-01-07 — End: 2023-02-07
  Filled 2023-01-07: qty 60, 30d supply, fill #0

## 2023-01-07 MED ORDER — POLYETHYLENE GLYCOL 3350 PO PACK 17 GM *I*
17.0000 g | PACK | Freq: Every day | ORAL | Status: DC
Start: 2023-01-07 — End: 2023-01-08
  Administered 2023-01-07 – 2023-01-08 (×2): 17 g via ORAL
  Filled 2023-01-07 (×2): qty 17

## 2023-01-07 MED ORDER — MAGNESIUM HYDROXIDE 400 MG/5ML PO SUSP *I*
30.0000 mL | Freq: Every day | ORAL | Status: DC | PRN
Start: 2023-01-07 — End: 2023-01-08
  Filled 2023-01-07: qty 30

## 2023-01-07 MED ORDER — DEXAMETHASONE SODIUM PHOSPHATE 4 MG/ML INJ SOLN *WRAPPED*
INTRAMUSCULAR | Status: AC
Start: 2023-01-07 — End: 2023-01-07
  Filled 2023-01-07: qty 1

## 2023-01-07 MED ORDER — POLYETHYLENE GLYCOL 3350 PO POWD *I*
17.0000 g | Freq: Every day | ORAL | 0 refills | Status: DC
Start: 2023-01-08 — End: 2023-02-19
  Filled 2023-01-07: qty 238, 14d supply, fill #0

## 2023-01-07 MED ORDER — MIDAZOLAM HCL 1 MG/ML IJ SOLN *I* WRAPPED
Freq: Once | INTRAMUSCULAR | Status: AC | PRN
Start: 2023-01-07 — End: 2023-01-07
  Administered 2023-01-07: 2 mg via INTRAVENOUS

## 2023-01-07 MED ORDER — LIDOCAINE HCL (PF) 1 % IJ SOLN *I*
0.1000 mL | INTRAMUSCULAR | Status: AC | PRN
Start: 2023-01-07 — End: 2023-01-07
  Administered 2023-01-07: 0.1 mL via SUBCUTANEOUS
  Filled 2023-01-07: qty 2

## 2023-01-07 MED ORDER — GABAPENTIN 300 MG PO CAPSULE *I*
300.0000 mg | ORAL_CAPSULE | Freq: Once | ORAL | Status: AC
Start: 2023-01-07 — End: 2023-01-07
  Administered 2023-01-07: 300 mg via ORAL
  Filled 2023-01-07: qty 1

## 2023-01-07 MED ORDER — ASPIRIN 81 MG PO TBEC *I*
81.0000 mg | DELAYED_RELEASE_TABLET | Freq: Two times a day (BID) | ORAL | Status: DC
Start: 2023-01-07 — End: 2023-01-08
  Administered 2023-01-07 – 2023-01-08 (×2): 81 mg via ORAL
  Filled 2023-01-07 (×5): qty 1

## 2023-01-07 MED ORDER — OXYCODONE HCL 5 MG PO TABS *I*
5.0000 mg | ORAL_TABLET | ORAL | Status: DC | PRN
Start: 2023-01-07 — End: 2023-01-08
  Administered 2023-01-07 – 2023-01-08 (×5): 5 mg via ORAL
  Filled 2023-01-07 (×5): qty 1

## 2023-01-07 SURGICAL SUPPLY — 39 items
ADHESIVE SKIN CLOSURE 0.7ML DERMABOND ADVANCED (Supply) ×1 IMPLANT
BANDAGE COMPR M W6INXL10YD WHT BGE VELC E MTRX HK AND LOOP SELF CLSR (Supply) ×1 IMPLANT
BEARING VG ANT STBLZD 12X75 (Implant) ×1 IMPLANT
BLADE SAG 6 13.1 X 1.27 X 90MM (Supply) ×1 IMPLANT
BLADE SAG AGRESSIVE TOOTH (Supply) ×1 IMPLANT
CEMENT REFOBACIN R W GENT 1X40 (Implant) ×2 IMPLANT
DRAPE EXTREMITY 89X128 (Drape) ×1 IMPLANT
DRAPE SHEET 40X70 MED (Drape) ×1 IMPLANT
DRAPE SUR W70XL100IN STD SMS POLYPR FULL SHT W/O FLD PCH DISP (Drape) ×1 IMPLANT
DRAPE SURG TOWEL LG 18 X 24IN (Drape) ×1 IMPLANT
DRESSING MEPILEX POST OP 4X12 (Dressing) ×1 IMPLANT
DRILL QUICK RELEASE STERILE PK2 1/8 (Supply) ×2 IMPLANT
ELECTRODE ELECTROSURG 70MM BLADE SMOKE EVAC PNCL NEPTUNE E-SEP (Supply) ×1 IMPLANT
FILTER NEPTUNE 4PORT MANIFOLD (Supply) ×1 IMPLANT
GLOVE BIOGEL PI MICRO IND UNDER SZ 7.5 LF (Glove) ×1 IMPLANT
GLOVE SURG BIOGEL PI ULTRATOUCH SZ 6.0 (Glove) ×4 IMPLANT
GLOVE SURG BIOGEL PI ULTRATOUCH SZ 7.5 (Glove) ×14 IMPLANT
GLOVE SURG GAMMEX NON-LATEX ORTHO PI SZ 8 (Glove) ×1 IMPLANT
GOWN SIRUS FABRIC REINFORCED SET IN XL (Gown) ×1 IMPLANT
HOOD SURG ISOLATION FLYTE SURGICOOL PEEL-AWAY (Supply) ×1 IMPLANT
IMPLANT VANGUARD CR ILOK FEM LT 70 (Implant) ×1 IMPLANT
KIT LEG POS DISP (Supply) ×1 IMPLANT
MIXER CEMENT W/FEM NOZZLE (Supply) ×1 IMPLANT
PACK CUSTOM TOTAL KNEE (Pack) ×1 IMPLANT
PLATE PRIMARY TIBIAL LOCK BAR 75MM (Plate) ×1 IMPLANT
SLEEVE COMP KNEE MED BLENDED (Supply) ×1 IMPLANT
SOL SOD CHL IRRIG 500ML BTL (Solution) ×1 IMPLANT
SOL SOD CHL IV .9PCT 1000ML BAG (Drug) ×1 IMPLANT
SOL WATER IRRIG STERILE 1000ML BTL (Solution) ×1 IMPLANT
SPIKE MINI DISPENSING PIN (BBRAUN DP1000) (Supply) ×1 IMPLANT
STEM TIB L40MM KNEE PRI FIN VANGUARD COMPLT SYS (Implant) ×1 IMPLANT
SUCTION YANKAUER HIGH CAP. (Supply) ×1 IMPLANT
SUTR MONOCRYL PLUS 3-0PS2 27IN UNDY (Suture) ×1 IMPLANT
SUTR VICRYL ANTIB 1 CTX 18 POP VIOLET (Suture) ×1 IMPLANT
SUTURE QUILL SZ 2 L2IN ABSRB VLT L48MM 1/2 CIR DA TAPR CUT NDL POLYDIOXANONE MFIL (Suture) ×1 IMPLANT
SUTURE STRATAFIX MONOCRYL PLUS 3-0 L30X30CM ABSORBABLE UNDYED PS-1 TRICLOSAN POLIGLECAPRONE 25 SPIRAL (Suture) ×1 IMPLANT
SYRINGE 60ML  LUER LOCK  STERILE (Syringe) ×2 IMPLANT
SYSTEM SURGIPHOR ANTIMICROBIAL IRRIGATION (Supply) ×1 IMPLANT
TAPE SURG TRANSPORE 1IN X 10YD LF (Supply) ×1 IMPLANT

## 2023-01-07 NOTE — Anesthesia Procedure Notes (Signed)
---------------------------------------------------------------------------------------------------------------------------------------    NEURAXIAL BLOCK PLACEMENT  Single Shot Spinal    Date of Procedure: 01/07/2023 8:07 AM    Patient Location:  OR    Reason for Block: intraoperative anesthetic      Block Completion: successfully completed  CONSENT AND TIMEOUT     Consent:  Obtained per policy    Timeout: patient identified (name/DOB) , proper patient position verified, operative procedure/site/side verified by patient or family , needed equipment, monitors, medications and access verified as present and functioning , operative procedure/side/site verified against surgical consent, allergies reviewed with patient/record , operative procedure/side/site verified against surgical schedule, all members of the block team participated in the timeout and anticoagulation/antiplatelet status reviewed  METHOD:    Patient Position: sitting    Monitoring: blood pressure, continuous pulse oximetry with supplemental oxygen and ECG      Labeling: syringes appropriately labeled    Sedation Used: yes            For medications used, please see MAR        Level of Sedation: mild      Sterile Technique:  Hand sanitizing, hat, mask and sterile gloves    Prep: aseptic technique per protocol, chlorhexidine+isopropanol and site draped      Successful Approach: midline    Successful Location: L3-4    Attempts (Skin Punctures):  1  SPINAL NEEDLE:      Introducer: 20 G    Type: Pencan    Gauge: 25 G    Length: 3.5 in  OBSERVATIONS:    Block Completion:  Successfully completed      Wet Tap: Yes      Paresthesia: none      Neuraxial Blood: blood not aspirated      Sensory Levels: bilateral      Peak Sensory Level: T10        Motor Block: dense      Patient Reaction to Block: tolerated procedure well    ----------------------------------------------------------------------------------------------------------------------------------------

## 2023-01-07 NOTE — Invasive Procedure Plan of Care (Signed)
CONSENT FOR MEDICAL  OR SURGICAL PROCEDURE                            Patient Name: Timothy Cross  [facility] [address]                                                              DOB: [birth year]         Please read this form or have someone read it to you.   It's important to understand all parts of this form. If something isn't clear, ask [place] to explain.   When you sign it, that means you understand the form and give [place] permission to do this surgery or procedure.     I agree for Laurance Flatten, MD , and Resident Physicians, Physician Assistants, Nurse First Assistants  along with any assistants* they may choose, to treat the following condition(s): Left Knee Arthritis   By doing this surgery or procedure on me: Left Total Knee Replacement    This is also known as: Left Total Knee Arthroplasty (TKA)   Laterality: Left     *if you'd like a list of the assistants, please ask. We can give that to you.    1. The care provider has explained my condition to me. They have told me how the procedure can help me. They have told me about other ways of treating my condition. I understand the care provider cannot guarantee the result of the procedure. If I don't have this procedure, my other choices are: No surgery, conservative care including rest, ice, injections, medication, bracing, cane, walker    2. The care provider has told me the risks (problems that can happen) of the procedure. I understand there may be unwanted results. The risks that are related to this procedure include: Infection requiring further surgery, bleeding, nerve injury causing foot drop, blood vessel injury requiring further surgery for limb salvage, tendon injury, ligament injury, bone fracture, implant failure, implant loosening, knee stiffness, medical complications, blood clots in the legs and/or lungs, need for further surgery, death, and unforseen complications.    3. I understand that during the procedure, my care provider may find a condition  that we didn't know about before the treatment started. Therefore, I agree that my care provider can perform any other treatment which they think is necessary and available.    4. I give permission to the hospital and/or its departments to examine and keep tissue, blood, body parts, fluids or materials removed from my body during the procedure(s) to aid in diagnosis and treatment, after which they may be used for scientific research or teaching by appropriate persons. If these materials are used for science or teaching, my identity will be protected. I will no longer own or have any rights to these materials regardless of how they may be used.    5. My care provider might want a representative from a medical device company to be there during my procedure. I understand that person works for:  [employer]        The ways they might help my care provider during my procedure include:        Helping the operating room staff prepare the special device  my care provider wants to use and Providing information and support to operating room staff regarding the device    6. Here are my decisions about receiving blood, blood products, or tissues. I understand my decisions cover the time before, during and after my procedure, my treatment, and my time in the hospital. After my procedure, if my condition changes a lot, my care provider will talk with me again about receiving blood or blood products. At that time, my care provider might need me to review and sign another consent form, about getting or refusing blood.    I understand that the blood is from the community blood supply. Volunteers donated the blood, the volunteers were screened for health problems. The blood was examined with very sensitive and accurate tests to look for hepatitis, HIV/AIDS, and other diseases. Before I receive blood, it is tested again to make sure it is the correct type.    My chances of getting a sickness from blood products are small. But no  transfusion is 100% safe. I understand that my care provider feels the good I will receive from the blood is greater than the chances of something going wrong. My care provider has answered my questions about blood products.      My decision  about blood or  blood products   Yes, I agree to receive blood or blood products if my care provider thinks they're needed.        My decision   about tissue  Implants     Yes, I agree to receive tissue implants if my care provider thinks they're needed.          I understand this  form.    My care provider  or his/her  assistants have  explained:   What I am having done and why I need it.  What other choices I can make instead of having this done.  The benefits and possible risks (problems) to me of having this done.  The benefits and possible risks (problems) to me of receiving transplants, blood, or blood products.  There is no guarantee of the results.  The care provider may not stay with me the entire time that I am in the operating or procedure room.  My provider has explained how this may affect my procedure. My provider has answered my  questions about this.         I give my  permission for  this surgery or  procedure.            _______________________________________________                                     My signature  (or parent or other person authorized to sign for you, if you are unable to sign for  yourself or if you are under 70 years old)        ______           Date        _____        Time   Electronic Signatures will display at the bottom of the consent form.    Care provider's statement: I have discussed the planned procedure, including the possibility for transfusion of blood  products or receipt of tissue as necessary; expected benefits; the possible complications and risks; and possible alternatives  and their benefits and risks with the patients or  the patient's surrogate. In my opinion, the patient or the patient's surrogate  understands the  proposed procedure, its risks, benefits and alternatives.              Electronically signed by: Laurance Flatten, MD                                                01/07/2023         Date        7:19 AM        Time   Your doctor or someone your doctor has appointed has told you that you may need blood or a blood product transfusion, which has been collected from volunteers, as part of your treatment as a patient.    The reasons you might need blood or blood products include, but are not limited to:   Significant loss of your own blood  Your body may not be getting enough oxygen to its tissues   Treatment of bleeding disorders caused by low platelet counts or platelets that do not work right (platelets are part of a cell that helps to form clots and keeps you from bleeding too much).  You may not have enough of other substances that help your blood clot or stop you from bleeding more  The risks of getting a transfusion of blood or blood products include, but are not limited to:   Damage to the lungs  Difficulty breathing due to fluid in the lungs  The product may contain bacteria or rarely a virus (which includes HIV and Hepatitis)  Blood from the community blood supply has been collected from volunteer donors who have been screened for health risk. The blood has been tested for major blood transmitted disease, but no transfusion is 100% safe. The blood is tested with very sensitive and accurate tests to screen for hepatitis, AIDS, and other disease, which makes the risks very small.  You may have side effects from the transfusion (rash, fever, chills) or an allergic reaction  The transfusion increases your risks of getting infection or cancer coming back  The transfusion can increase the time you have to stay in the hospital  The transfusion can potentially cause death if the wrong blood is given or your body rejects the blood  Before blood is transfused, it is tested again to make sure it is the correct type  There  are other options than getting blood or blood products from other people and they include:   Drugs which can decrease bleeding  Drugs which can cause your body to make more blood (used in elective procedures with advance notice)  Autologous (your own blood) donation (needs pre-arrangement)  No transfusion  If you exercise your right to refuse to be transfused with blood or blood products; these things listed below, among others, could happen to you:  Your body may not get enough oxygen and suffer damage  You may have a higher chance of bleeding  You may limit other options for your condition  You may die from losing too much blood

## 2023-01-07 NOTE — Anesthesia Case Conclusion (Signed)
CASE CONCLUSION  Emergence  Actions:  LMA removed  Criteria Used for Airway Removal:  Adequate Tv & RR and acceptable O2 saturation  Assessment:  Routine  Transport  Directly to: PACU  Airway:  Facemask  Oxygen Delivery:  6 lpm  Position:  Supine  Patient Condition on Handoff  Level of Consciousness:  Mildly sedated  Patient Condition:  Stable  Handoff Report to:  RN

## 2023-01-07 NOTE — Progress Notes (Addendum)
[name] was seen and evaluated at bedside in pre-op holding, H&P reviewed, no changes noted.  We reviewed risks related to the procedure including infection requiring further surgery and prolonged morbidity and in extreme cases loss of limb, bleeding, nerve injury resulting in permanent leg weakness/foot drop & gait impairment, blood vessel injury requiring further surgery and in extreme cases loss of limb, iatrogenic femur/tibia fracture, knee stiffness, wound drainage, risk of anesthesia, risk of exacerbation of underlying medical issues resulting in ileus, heart attack, stroke, respiratory failure, kidney failure, blood clots in the legs and or lungs, and even death. The patient understood these risks and elected to proceed. Informed consent was obtained verbally, the surgical site initialed, plan to proceed with left TKA.    PHYSICAL EXAM OPERATIVE EXTREMITY:  Deformity:significant varus  Range of Motion: 5 - 130 degrees  Crepitus:Mild  Effusions: 1+  Tenderness: Moderate medial femoral condyle, medial joint line  Distal pulse palpable: DP  Gait Description: antalgic      Laurance Flatten, MD 01/07/2023 7:20 AM

## 2023-01-07 NOTE — Progress Notes (Signed)
Physical Therapy    Initial Eval Completed.  Patient is a 62 y.o.male who presents s/p R TKA POD#0, WBAT.    Is another physical therapy session necessary prior to hospital discharge:  Yes    Discharge recommendation:  Anticipate return to prior living arrangement, Intermittent supervision/assist, Home PT  Equipment recommendations upon discharge: Rolling walker    Mobility recommendations for nursing while in hospital: 1A with RW    Past Medical History:   Diagnosis Date    Anxiety     Arthritis     hip    Cancer     Colon polyp     Inguinal hernia bilateral, non-recurrent 03/06/2016    Left cervical radiculopathy 12/29/2019    Osteoarthritis of right hip 08/25/2019    Rib fracture 2023    left side    S/P left inguinal hernia repair 02/11/2019    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       Past Surgical History:   Procedure Laterality Date    INGUINAL HERNIA REPAIR      KNEE SURGERY Bilateral     arthroscopy ACL reconstruction    PR ARTHRP ACETBLR/PROX FEM PROSTC AGRFT/ALGRFT Right 09/24/2019    Procedure: RIGHT ARTHROPLASTY, HIP, TOTAL ;  Surgeon: Laurance Flatten, MD;  Location: [facility] MA[facility];  Service: Orthopedics    PR LAPAROSCOPY SURG RPR INITIAL INGUINAL HERNIA Bilateral 10/29/2016    Procedure:  LAPAROSCOPIC BILATERAL  INGUINAL HERNIA REPAIR with mesh;  Surgeon: Gwenyth Allegra, MD;  Location: [facility];  Service: General    PR LAPAROSCOPY SURG RPR INITIAL INGUINAL HERNIA Left 01/29/2019    Procedure: LAPAROSCOPIC REPAIR WITH MESH, HERNIA, LEFT INGUINAL, ROBOT-ASSISTED;  Surgeon: Gwenyth Allegra, MD;  Location: [facility];  Service: General    PR LAPS REPAIR HERNIA EXCEPT INCAL/INGUN REDUCIBLE N/A 10/29/2016    Procedure:  UMBILICAL HERNIA REPAIR;  Surgeon: Gwenyth Allegra, MD;  Location: [facility];  Service: General    UMBILICAL HERNIA REPAIR      VASECTOMY      Surgery Of Male Genitalia Vasectomy Conversion Data        *Bold Indicates co-morbidities affecting treatment and recovery    Comorbidities affecting  treatment/recovery in addition to those listed above:  none noted    Personal factors affecting treatment/recovery:  Advanced age  Caregiver limitations  Needs assistance for mobility    Clinical presentation:  stable       Patient complexity:  low level as indicated by above personal factors, environmental factors and comorbidities in addition to their impairments found on physical exam.     PT Adult Assessment - 01/07/23 1240          Prior Living     Prior Living Situation Reported by patient     Lives With Alone     Type of Home [place]     # Steps to Enter Home 3     # Rails to Enter Home 1     # Of Steps In Home 0     # Rails in Home 0     Location of Bedrooms 1st floor     Location of Bathrooms 1st floor     Current Home Equipment Toilet seat (raised w/arms);Walker (rolling);Shower chair;Sock aid;Long handled shoe horn        Prior Function Level    Prior Function Level Reported by patient     Transfers Independent     Transfer Devices None  Walking Independent     Walking assistive devices used None     Stair negotiation Independent     History of Falls No     Receives Help From Independent     Type of Help Received PTA Spouse is supportive and able to assist as needed        PT Tracking    PT TRACKING PT Assigned     Type of Session evaluation     SW Request? No        Treatment Day    Treatment Day ([facility] / [facility]) 1        Precautions/Observations    Precautions used Yes     Weight Bearing Status Left Lower Extremity - Weight bearing as tolerated     LDA Observation IV lines     Was patient wearing a mask? No     PPE worn by Clinical research associate [facility]     Fall Precautions General falls precautions;Patient educated to use call light for nursing assist prior to getting up;White board updated with appropriate mobility status        Current Pain Assessment    Pain Assessment / Reassessment Assessment     Pain Scale 0-10 (Numeric Scale for Pain Intensity)      0-10 Scale 5     Pain Location/Orientation Knee Left      Pain Descriptors Aching;Sore     (T) Pain Intervention(s) for current pain Cold applied;Repositioned     No Intervention/MAR Intervention(s) Medication (See eMAR)        Vision     Current Vision Adequate for PT session        Cognition    Cognition Tested     Arousal/Alertness Appropriate responses to stimuli     Orientation Alert and oriented x3     Ability to Follow Instructions Follows all commands and directions without difficulty        UE Assessment    Assessment Focus Range of Motion;Strength        LUE (degrees)    Overall ROM AROM WFL        RUE (degrees)    Overall ROM AROM WFL        LUE Strength    Overall Strength WFL assessed within functional activities        RUE Strength    Overall Strength WFL assessed within functional activities        LE Assessment    Assessment Focus Strength        LLE Strength    Overall Strength WFL assessed within functional activities     Hip Flexion 3+/5     Knee Extension 4/5     Ankle Dorsiflexion 4/5     Ankle Plantar Flexion 4/5        RLE Strength    Overall Strength WFL assessed within functional activities     Hip Flexion 4/5     Knee Extension 4/5     Ankle Dorsiflexion 4/5     Ankle Plantar Flexion 4/5        Sensation    Sensation No apparent deficit        Bed Mobility    Bed mobility Tested     Supine to Sit 1 person assist;Contact guard;Verbal cues     Sit to Supine Not tested     Additional comments increased time and effort to move LEs towards EOB with encouragement to allow L knee to flex while sitting at EOB -  no complaints of dizziness or feeling light headed with change in position        Transfers    Transfers Tested     Sit to Stand 1 person assist;Contact guard;Verbal cues     Stand to sit 1 person assist;Contact guard;Verbal cues     Transfer Assistive Device rolling walker     Additional comments increased time and effort to perform from EOB due to decreased LE strength and L knee pain        Mobility    Mobility Tested     Gait Pattern 3  point;Decreased cadence;Decreased R step length;Decreased R step height;Decreased L step length;Decreased L step height     Ambulation Assist 1 person assist;Contact guard     Ambulation Distance (Feet) 100     Ambulation Assistive Device rolling walker     Additional comments verbal cues provided for sequencing of walker with gait pattern and upright posture - chair follow for safety        Family/Caregiver Training`    Patient/Family/Caregiver training Yes     Family/caregiver training Role of physical therapy in hospital and plan for evaluation and follow up;Discharge planning;PT plan of care after evaluation;Use of assistive device;Therapeutic exercises, including recommendation of frequency of therapeutic exercises to be completed by patient throughout day;Benefits of oob activity and mobility during hospitalization, including frequency of ambulation and change of position;Recommendation to increase oob activity and ambulation with nursing while in hospital;Call don't fall, purpose of bed/chair alarm, and recommendation for nursing assistance during all oob mobility     Patient training Role of physical therapy in hospital and plan for evaluation and follow up;Discharge planning;PT plan of care after evaluation;Use of assistive device;Therapeutic exercises, including recommendation of frequency of therapeutic exercises to be completed by patient throughout day;Benefits of oob activity and mobility during hospitalization, including frequency of ambulation and change of position;Recommendation to increase oob activity and ambulation with nursing while in hospital;Call don't fall, purpose of bed/chair alarm, and recommendation for nursing assistance during all oob mobility        Balance    Balance Tested     Sitting - Static Unsupported;Supervision     Sitting - Dynamic Unsupported;Contact guard     Standing - Static Supported;Contact guard     Standing - Dynamic Supported;Contact guard        Functional Outcome  Measures    Functional Outcome Measures Yes        PT AM-PAC Mobility    Turning over in bed? None: Modified Independence/independent     Moving from lying on back to sitting on the side of the bed? None: Modified Independence/independent     Moving to and from a bed to a chair? None: Modified Independence/independent     Sitting down on and standing up from a chair with arms? None: Modified Independence/independent     Need to walk in hospital room? None: Modified Independence/independent     Climbing 3 - 5 steps with a railing? None: Modified Independence/independent     Total Raw Score 24     Standardized Score - Calculated 61.14     % Functional Impairment - Calculated 0%        Assessment    Brief Assessment Appropriate for skilled therapy     Problem List Impaired LE strength;Impaired endurance;Impaired balance;Impaired transfers;Impaired ambulation;Impaired functional status;Impaired mobility;Impaired bed mobility;Impaired stair navigation;Impaired functional mobility;Pain contributing to impairment     Patient / Family Goal return home  Overall Assessment Bill presents POD#0 s/p L TKA WBAT. He is limited by pain and generalized weakness post operatively. Anticipate he will progress to independence with mobility for safe d/c POD#1.        Plan/Recommendation    PT Treatment Interventions Restorative PT     PT Frequency 5-7x/wk     PT Mobility Recommendations 1A with RW     PT Referral Recommendations OT;SW;Home care     PT Discharge Recommendations Anticipate return to prior living arrangement;Intermittent supervision/assist;Home PT     PT Discharge Equipment Recommended Rolling walker     PT Assessment/Recommendations Reviewed With: Family;Nursing;Patient;Social Worker     Next PT Visit progress per protocol     PT needs to see patient prior to DC  Yes        Time Calculation    PT Untimed Codes 24     PT Unbilled Time 0     PT Total Treatment 24        Plan and Onset date    Plan of Care Date 01/07/23      Onset Date 01/07/23     Treatment Start Date 01/07/23                     AMPAC Score Interpretation:  Adapted from Daniel L. Young et al [employer] learning prediction of hospital patient need for post-acute care using admission mobility measure is robust across patient diagnoses. Health Policy and Technology, volume 12, issue 2, June 2023, 100754     Patients 66 years or older with an AM-PAC standardized score of <31 have an increased likelihood of requiring post-acute care (76%)  Patients that are less than 24 years old with an AM-PAC standardized score of <31 (41-59%) or, patients that are>43 years old with an AM-PAC standardized score >/=31 (37-50%) have a moderate likelihood of requiring post-acute care   Patients with an AM-PAC standardized score of >43 have an increased likelihood for discharge to home regardless of age. (5-12%)    Arlan Organ, PT  Web page or secure chat to communicate if needed.

## 2023-01-07 NOTE — Progress Notes (Signed)
Report Given To     Danielle, RN    Descriptive Sentence / Reason for Admission   LEFT ARTHROPLASTY, KNEE, TOTAL     Dr. Ardine Eng        Active Issues / Relevant Events     Patient received Bupivacaine spinal @ 0746; left adductor canal block  Has sensation at level L3, abld to bend right knee  Patient not  BG protocol; pre-op bg 90 mg/dl  Last pain score 2/95  Oxygen is 98% on RA  Medications administered  none  Comorbidities include:  GERD  Prostate cancer  Xray complete in PACU   Bladder scanned for: 601 ml @ 0957; st cath for 700 ml              To Do List    PT/OT  VS  Pain management

## 2023-01-07 NOTE — Op Note (Signed)
OPERATIVE NOTE    Date of Surgery:  01/07/2023  Surgeon:  Laurance Flatten, MD  First Assistant:  Melody Comas, MD PGY III  Second Assistant: Gladis Riffle, RNFA    Pre-Op Diagnosis:    Left knee DJD    Deformity:  1. Varus: severe   2. Flexion contracture: 5 degrees    Anesthesia Type:    Spinal, Regional, General    Post-Op Diagnosis:  Primary:  Same as preoperative     Procedure(s) Performed :  Left TKA (13244)    Soft Tissue Release:  1.  Popliteus    Intraoperative Findings:  1. End Stage left knee DJD    Implants:  1.  [employer] Vanguard CR femur size 70 mm  2.  Unresurfaced  3.  [employer] primary tibial base plate size 75 mm, 40 mm modular finned stem  4.  [employer] AS polyethylene size 12 mm  5.  [employer] Refobacin cement      Fixation:  1. Femur: Surface cement  2. Tibia: Full cement    Estimated Blood Loss:  100  Specimens to Pathology:  No  Tourniquet Time: NA   Patient Condition:  good    Indications:   Timothy Cross is a 62 y.o. male with end stage left knee DJD refractory to conservative treatment, who elected to receive left  total knee arthroplasty.    Description of Procedure:  Timothy Cross was identified in the preoperative holding area and the intended surgical site was marked with the initials "JG". Risks related to this procedure were reviewed with the patient. These risks include but are not limited to infection requiring further surgery and in extreme cases loss of limb, bleeding requiring blood transfusion, nerve injury resulting in foot drop, blood vessel injury, iatrogenic femur/tibia fracture, eventual component failure, post-operative knee stiffness requiring future manipulation, need for further surgery, risks of anesthesia, risk of exacerbation of underlying medical conditions including blood clots in the legs and/or lungs, ileus, heart attack, stroke, respiratory failure, and even death. The patient understood these risks and elected to proceed.  Informed consent was  obtained, surgical site marked. The patient was escorted to the operative suite, and was placed supine on the operating table. Following induction of anesthesia, all bony prominences were well padded. The patient was then prepped and draped in the normal sterile fashion.  Following the standard pre-surgical pause and administration of pre-operative antibiotics, a longitudinal incision slightly medial to the tibial tubracle was made. Dissection was carried sharply down to the level of the quadriceps tendon and patellar retinaculum.  A deep knife was utilized to create a medial parapatellar arthrotomy.  The intraoperative findings listed above were noted. The patella was everted, and the thickness measured with calipers. Synovectomy and lateral facet release completed. Resection amount was confirmed with caliper measurement. Medial exposure was completed, and the anterior horn of the medial and lateral meniscus were ressected along with a portion of the prepatellar fat pad, and ACL. Whiteside's line was marked along with the intramedullary starting position with bovie electrocautery. The femoral canal was perforated with a starting drill, suctioned, and the intramedullary alignment guide inserted through the 5 degree left paddle. The paddle was pinned into position in the appropriate rotation. The distal femoral cutting guide was inserted into the 5 degree left paddle, and pinned. The paddle was then removed along with the intramedullary alignment rod and the distal femoral resection was completed with an oscillating saw blade. The distal  femoral resection was measured with caliper to confirm appropriate resection.  The paddle & IM rod were reinserted to confirm plane os resection. The external rotation/sizing guide was then placed referencing the posterior condylar axis, and the femur was sized and drilled at 3 degrees of external rotation. Rotation was confirmed by referencing anatomic landmarks.  The 72.5 then 70  size 4-1 femoral cutting block was impacted into place and secured with drillable pins. All important ligamentous and tendinous structures were protected. Anterior resection, posterior resection, posterior chamfer, and anterior chamfer cuts were made with an oscillating saw blade and the cutting block removed. Femoral resections were cleared with hand tools and femoral osteophytes were removed with a rongeur. A PCL retractor was inserted to deliver the tibia anterior and protect neurovascular structures. The medial & lateral meniscal remnants were removed, PCL recessed, and the tibia cutting guide positioned referencing 8 mm from the high point of the lateral plateau. Once the tibia guide was correctly oriented in the vertical,coronal, axial, and sagittal planes the cutting block was secured with three drillable pins.  The tibia was resected with an oscillating saw blade.  The appropriately sized tibial base plate was selected, oriented in the correct rotation, and secured with pins x 2.  Propper coronal resection was confirmed with an alignment rod through paddle attached to the trial baseplate. Final preparation was completed by attaching the tibial tower to the baseplate, completing the opening reaming through the tower, followed by impacting the finned punch with mallet. Posterior osteophytes were cleared with hand tools, the flexion gap tensioned  with hand, and assessed for symmetry. The trial femur placed.   The 10 & 12 polyethylene liners were trialed. The soft tissue releases listed above were performed. The final liner listed above was selected which provided the best stability in both the coronal and anterior posterior planes. The patella tracking was noted to be appropriate.  With all trial components in place the knee was noted to be stable in flexion and extension. Range of motion was from full extension to greater than 120 degrees of flexion. All components were removed, bony surfaces copiously  irrigated and dried with a lap spounge. Cement technique include application of cement to both clean implant and bone surfaces, as well as pressurization of bone application with cement gun & hand tools. The final implants listed above were inserted. Knee explored and extraneous cement was removed. The knee was reassessed for stability, ROM, and patellar tracking all of which were appropriate. The knee was copiously irrigated, soaked in sterile betadine and closed in layers. The arthrotomy was closed in a semi-flexed position utilizing  #1 Vicryl suture then Quill. The knee was injected with .25% marcaine with epinephrine & Toradol. Patella tracking was reassessed. The remaining wound was closed with 2-0 Quill for the subcutaneous tissue, staples, and dermabond for skin. A sterile dressing was placed and drapes removed. The patient was weaned from his sedation and taken to the PACU in stable condition. There were no complications noted.    Infection Prevention Measurers:  SA/MRSA Screening  Antibiotics  Foot/toe covering  CHG clear Pre scrub  Positive Pressure Suiting with sleeves ioban sealed  CHG orange Prep  4 layers sterile draping including 1 impervious layer  CHG reprep following draping  Glove change prior to incision  Ioban layer  Efficient surgery time  Glove change prior to implant insertion  Suction tip change prior to implant insertion  Sterile betadine soak following implant insertion  Dermabond occlusive dressing  Signed:  Laurance Flatten, MD on 01/07/2023 at 8:58 AM

## 2023-01-07 NOTE — Progress Notes (Addendum)
Physical Therapy    Treatment session completed.  Pt cleared PT to return home.    Is another physical therapy session necessary prior to hospital discharge:  No    Discharge recommendation:  Anticipate return to prior living arrangement, Intermittent supervision/assist, Home PT  Equipment recommendations upon discharge: None  Mobility recommendations for nursing while in hospital: 1A with RW     PT Adult Assessment - 01/07/23 1535          PT Tracking    PT TRACKING PT Assigned     Type of Session follow up/treatment     SW Request? No        Treatment Day    Treatment Day (HH / URR) 1        Precautions/Observations    Precautions used Yes     Weight Bearing Status Left Lower Extremity - Weight bearing as tolerated     Was patient wearing a mask? No     PPE worn by Clinical research associate [facility]     Fall Precautions General falls precautions;Patient educated to use call light for nursing assist prior to getting up        Current Pain Assessment    Pain Assessment / Reassessment Assessment     Pain Scale 0-10 (Numeric Scale for Pain Intensity)      0-10 Scale 3     Pain Location/Orientation Knee Left     Pain Descriptors Aching     (T) Pain Intervention(s) for current pain Repositioned;Cold applied        Vision     Current Vision Adequate for PT session        Cognition    Cognition Tested     Arousal/Alertness Appropriate responses to stimuli     Orientation Alert and oriented x4     Ability to Follow Instructions Follows all commands and directions without difficulty        Bed Mobility    Bed mobility Not tested     Additional comments greeted and departed in recliner        Transfers    Transfers Tested     Sit to Stand Modified independent (device)     Stand to sit Modified independent (device)     Transfer Assistive Device rolling walker        Mobility    Mobility Tested     Gait Pattern 2 point     Ambulation Assist Modified independent (device)     Ambulation Distance (Feet) 200     Ambulation Assistive Device  rolling walker     Stairs Assistance Stand by;Modified independent (device)     Stair Management Technique One rail;Step to pattern;With cane;Forwards     Number of Stairs 4   x2 trial    Additional comments Pt ascends and descends 4 stairs using cane, first trial requires cues for sequencing, able to complete 2nd trial without cues or overt loss of balance.  Spouse present        Family/Caregiver Training`    Patient/Family/Caregiver training Yes     Family/caregiver training Role of physical therapy in hospital and plan for evaluation and follow up;Discharge planning;PT plan of care after evaluation;Use of assistive device;Therapeutic exercises, including recommendation of frequency of therapeutic exercises to be completed by patient throughout day;Benefits of oob activity and mobility during hospitalization, including frequency of ambulation and change of position;Recommendation to increase oob activity and ambulation with nursing while in hospital;Call don't fall, purpose of bed/chair alarm, and recommendation  for nursing assistance during all oob mobility     Patient training Role of physical therapy in hospital and plan for evaluation and follow up;Discharge planning;PT plan of care after evaluation;Use of assistive device;Therapeutic exercises, including recommendation of frequency of therapeutic exercises to be completed by patient throughout day;Benefits of oob activity and mobility during hospitalization, including frequency of ambulation and change of position;Recommendation to increase oob activity and ambulation with nursing while in hospital;Call don't fall, purpose of bed/chair alarm, and recommendation for nursing assistance during all oob mobility        Therapeutic Exercises    Exercises Performed LE     Additional comments Provided pt written HEP and ambulation program.  Pt demonstrated 5 reps hip flexion marches, TKE, heel raises, STS, and heel slides.  Pt demonstrated understanding         Balance    Balance Tested     Sitting - Static Independent     Sitting - Dynamic Independent     Standing - Static Supported;Independent     Standing - Dynamic Independent;Supported        PT AM-PAC Mobility    Turning over in bed? None: Modified Independence/independent     Moving from lying on back to sitting on the side of the bed? None: Modified Independence/independent     Moving to and from a bed to a chair? None: Modified Independence/independent     Sitting down on and standing up from a chair with arms? None: Modified Independence/independent     Need to walk in hospital room? None: Modified Independence/independent     Climbing 3 - 5 steps with a railing? None: Modified Independence/independent     Total Raw Score 24     Standardized Score - Calculated 61.14     % Functional Impairment - Calculated 0%        Gait Speed    Assistive Device rolling walker     Distance Walked (meters) 5 m     Time it took to walk distance (seconds) 11.2 sec     Gait speed (m/sec) 0.45 m/sec        Assessment    Brief Assessment Remains appropriate for skilled therapy     Problem List Impaired LE strength;Impaired endurance;Impaired balance;Impaired transfers;Impaired ambulation;Impaired functional status;Impaired mobility;Impaired bed mobility;Impaired stair navigation;Impaired functional mobility;Pain contributing to impairment     Patient / Family Goal return home     Overall Assessment Pt cleared PT        Plan/Recommendation    PT Treatment Interventions Restorative PT     PT Frequency 5-7x/wk     PT Mobility Recommendations 1A with RW     PT Referral Recommendations OT;SW;Home care     PT Discharge Recommendations Anticipate return to prior living arrangement;Intermittent supervision/assist;Home PT     PT Discharge Equipment Recommended None    PT Assessment/Recommendations Reviewed With: Patient;Nursing;Family     Next PT Visit pt cleared PT     PT needs to see patient prior to DC  No        Time Calculation    PT Timed  Codes 23     PT Untimed Codes 0     PT Total Treatment 23        Plan and Onset date    Plan of Care Date 01/07/23     Onset Date 01/07/23     Treatment Start Date 01/07/23  AMPAC Score Interpretation:  Adapted from Daniel L. Young et al [employer] learning prediction of hospital patient need for post-acute care using admission mobility measure is robust across patient diagnoses. Health Policy and Technology, volume 12, issue 2, June 2023, 100754     Patients 66 years or older with an AM-PAC standardized score of <31 have an increased likelihood of requiring post-acute care (76%)  Patients that are less than 61 years old with an AM-PAC standardized score of <31 (41-59%) or, patients that are>31 years old with an AM-PAC standardized score >/=31 (37-50%) have a moderate likelihood of requiring post-acute care   Patients with an AM-PAC standardized score of >43 have an increased likelihood for discharge to home regardless of age. (5-12%)    Pleas Koch, PT  Web page or secure chat to communicate if needed.

## 2023-01-07 NOTE — Anesthesia Procedure Notes (Signed)
---------------------------------------------------------------------------------------------------------------------------------------    AIRWAY   GENERAL INFORMATION AND STAFF    Patient location during procedure: OR       Date of Procedure: 01/07/2023 7:59 AM  CONDITION PRIOR TO MANIPULATION     Current Airway/Neck Condition:  Normal        For more airway physical exam details, see Anesthesia PreOp Evaluation  AIRWAY METHOD     Patient Position:  Sniffing    Preoxygenated: yes      Maintained In-Line Stability: not needed, normal c-spine condition          To see details of medications used, see MAR    Induction: IV    Mask Difficulty Assessment:  1 - vent by mask      Mask NMB: 1 - vent by mask    Number of Attempts at Approach:  1    Number of Other Approaches Attempted:  0  FINAL AIRWAY DETAILS    Final Airway Type:  LMA    Final LMA: Unique    LMA Size: 4    Airway Seal Pressure (cm H2O):  24  ----------------------------------------------------------------------------------------------------------------------------------------

## 2023-01-07 NOTE — Progress Notes (Signed)
Orthopaedic Surgery Progress Note for 01/07/2023    Patient:Timothy Cross  MRN: [id number]  DOA: 01/07/2023    Subjective: Patient seen post-op. Pain under control. Tolerating oral intake. Yet to void.  Has worked with physical therapy. Patient denies fever/chills, SOB, chest pain, nausea/vomiting, or numbness/weakness.     Objective:  Temp:  [36.2 C (97.2 F)] 36.2 C (97.2 F)  Heart Rate:  [49-51] 50  Resp:  [12-16] 12  BP: (114-123)/(71-75) 114/71      No results for input(s): "WBC", "HGB", "HCT", "PLT" in the last 168 hours.  No results for input(s): "NA", "K", "CL", "CO2", "CREATININE", "GLUCOSE" in the last 168 hours.    No components found with this basename: "BUN", "LABGLOM", "CALCIUM"  No results for input(s): "APTT", "INR", "PTT" in the last 168 hours.  No results for input(s): "ESR", "CRP" in the last 168 hours.    Exam:  NAD  No respiratory distress    LLE: Dressing c/d/i, SILT sp/dp/saph/sur/tib nerve distributions, SILT over tips of toes.Able to ADF/APF. Able to flex and extend toes. 2+ DP and PT pulses.    Imaging:  Xrays left knee obtained post-operatively reviewed demonstrating intact hardware in appropriate alignment.          Assessment/Plan: Timothy Cross is a 62 y.o. male admitted on 01/07/2023 who is now  s/p left total knee arthroplasty on 01/07/2023        RTOR: No   Pain control: Tylenol ATC, Oxy PRN, Dilaudid for breakthrough pain   Antibiotics: Periop Ancef   Imaging: AP L knee in PACU  Foley: No  Bowel regimen on board  Diet: Regular   Weight-bearing status: WBAT LLE, ice/elevate   Dressing: Mepilex   Additional Specification: None  PT/OT & OOB    DVT prophylaxis: ASA   Disposition: Home pending PT/Pain control              Franne Grip, MD on 01/07/2023 at 9:28 AM

## 2023-01-07 NOTE — Plan of Care (Signed)
HOME CARE AGENCY CHOICE    Medicare regulations require that hospitals advise patients that they may choose to receive services from any home health agency.  Patient/family choice of agency is noted with an X by that agency's name.      Agencies: []   [employer] [facility] [place], [place], [place], [place], [place], [place] and [place] counties)       []   [facility]       []   [facility]      [x]   [facility]        []   Other ____________________      Home Care Agency Representative alerted:    Name: Sharon Seller, RN  Date: 01/01/2023  Time:       *[facility] along with [facility], [facility] and [facility] are all part of the [place] [facility] [place]'s health care system.

## 2023-01-07 NOTE — Progress Notes (Signed)
Occupational Therapy    Initial Eval Completed.  Patient is a 62 y.o.male who presents s/p L TKA by Dr. Ardine Eng on 01/07/2023. LLE WBAT. OT evaluated pt POD #0, cleared by OT.     Does OT need to see patient PRIOR to D/C: No    Discharge recommendation:   Prior Living Environment, Intermittent supervision (assist with IADLs/TEDs PRN)   Equipment recommendations: None   Hospital Stay Recommendations for nursing: 1A RW to bathroom    Past Medical History:   Diagnosis Date    Anxiety     Arthritis     hip    Cancer     Colon polyp     Inguinal hernia bilateral, non-recurrent 03/06/2016    Left cervical radiculopathy 12/29/2019    Osteoarthritis of right hip 08/25/2019    Rib fracture 2023    left side    S/P left inguinal hernia repair 02/11/2019    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       Past Surgical History:   Procedure Laterality Date    INGUINAL HERNIA REPAIR      KNEE SURGERY Bilateral     arthroscopy ACL reconstruction    PR ARTHRP ACETBLR/PROX FEM PROSTC AGRFT/ALGRFT Right 09/24/2019    Procedure: RIGHT ARTHROPLASTY, HIP, TOTAL ;  Surgeon: Laurance Flatten, MD;  Location: [facility];  Service: Orthopedics    PR LAPAROSCOPY SURG RPR INITIAL INGUINAL HERNIA Bilateral 10/29/2016    Procedure:  LAPAROSCOPIC BILATERAL  INGUINAL HERNIA REPAIR with mesh;  Surgeon: Gwenyth Allegra, MD;  Location: [facility];  Service: General    PR LAPAROSCOPY SURG RPR INITIAL INGUINAL HERNIA Left 01/29/2019    Procedure: LAPAROSCOPIC REPAIR WITH MESH, HERNIA, LEFT INGUINAL, ROBOT-ASSISTED;  Surgeon: Gwenyth Allegra, MD;  Location: [facility];  Service: General    PR LAPS REPAIR HERNIA EXCEPT INCAL/INGUN REDUCIBLE N/A 10/29/2016    Procedure:  UMBILICAL HERNIA REPAIR;  Surgeon: Gwenyth Allegra, MD;  Location: [facility];  Service: General    UMBILICAL HERNIA REPAIR      VASECTOMY      Surgery Of Male Genitalia Vasectomy Conversion Data        *Bold Indicates co-morbidities affecting treatment and recovery    Occupational Profile  relating to the present problem:  Lives alone    In addition to the PMH and surgical history listed above, the comorbidities affecting treatment/ recovery:   none noted    Performance deficits that result in activity limitations and/or performance restrictions:  N/A    Modification of tasks needed or assistance with assessments necessary to enable completion of evaluation:  Pt was MOD I for all tasks    Patient complexity:  low level as indicated by above personal factors, environmental factors and comorbidities in addition to their impairments found on physical exam.       OT Assessment ([facility] / [facility]) - 01/07/23 1556          OT Tracking  ([facility] / [facility])    OT Tracking ([facility] / [facility]) OT Discontinue     Type of Session evaluation     SW Request? No        OT Last Visit    Visit (#)  0        Precautions    Precautions used Yes     Total Knee Replacement --   ace wrap on L leg    Weight Bearing Status Left Lower Extremity - Weight bearing as  tolerated     Fall Precautions General falls precautions;Visitor present   SO present at end of session    LDA Observation IV lines     Patient Wearing Mask No     Writer wearing PPE including Gloves     Activity Order Activity as tolerated        Home Living (Prior to Admission)    Prior Living Situation Reported by patient;Obtained via chart review     Type of Home [place]     # Steps to Enter Home 3     # Of Steps In Home 0     Location of Bedroom First floor     Location of Bathroom First floor     Bathroom Shower/Tub Tub/Shower unit     Current Home Equipment Long handled Social research officer, government aid;Toilet seat (raised w/arms);Transfer tub bench;Walker (rolling)        Prior Function    Prior Function Reported by patient;Obtained during chart review     Level of Independence Independent with ADLs;Independent with ADL functional transfers;Independent ambulation;Independent with homemaking with ambulation;Driving     Lives With Alone;Significant other     Receives  Help From Independent     IADL Independent     Vocational Retired     Additional Comments pts SO will be staying with pt upon d/c and can assist PRN        Current Pain Assessment    Pain Assessment / Reassessment Assessment     Pain Scale 0-10 (Numeric Scale for Pain Intensity)      0-10 Scale 2     Pain Location/Orientation Knee Left     (T) Pain Intervention(s) for current pain Repositioned;Cold applied        Vision     Current Vision No visual deficits        Cognition    Cognition No deficit noted     Level of Alertness Appropriate responses to stimuli     Orientation Alert and oriented x4     Attention  Appears intact     Memory Appears intact     Following Commands Follows one step commands 100% of the time        Medication Management    Medication Management System Yes     Uses Pills from bottle        Perception    Perception No deficit noted        Coordination    Coordination Additional Comments     Additional comments [facility]        Sensation    Sensation No apparent deficit        UE Assessment    UE Assessment Full AROM RUE;Full AROM LUE     Additional Comments WFL        Bed Mobility    Additional Comments pt in recliner at beginning and end of session        Functional Transfers    Sit to Stand Modified independent;Rolling walker     Stand to Sit Modified independent;Rolling walker     Toilet Transfers Modified Independent        Transfer Device(s) Rolling walker        DME Used Commode     Functional Mobility Modified Independent;Rolling Walker     Additional Comments pt completed all transfers with MOD I using RW. ambulated within room and bathroom and completed a toilet transfer from commode over toilet, similar to home set  up        Balance    Sitting - Static Independent      Sitting - Dynamic Independent     Standing - Static Independent     Standing - Dynamic Independent     Standing Tolerance during Functional Task good     Additional Comments no LOB noted        ADL Assessment    LE Dressing  Modified independent;Set up        Assist Needed With Setup;Increased time        Where LE Dressing Assessed Chair;Standing        Equipment Used Walker     Toileting Modified independence        Assist Needed With Raised toilet seat        Where Toileting Assessed Commode over toilet;Standing        Equipment Used Commode;Rolling walker;Urinal     Additional Comment(s) therapist educated pt on compression stocking purpose and wearing schedule. demonstrated zip lock bag technique for donning. pt states he will have help with these at home PRN. pt able to don underwear on his own. pt completed toileting on RTS/using urinal with MOD I, able to manage clothing and perihygeine on his own        Activity Tolerance    Endurance Tolerates 30 min activity with multiple rests     Additional Comments no SOB noted        Functional Outcomes Measures    Functional Outcome Measures Yes        OT AM-PAC Self Care    Putting on and taking off regular lower body clothing? 3     Bathing (including washing, rinsing, drying)? 3     Toileting, which includes using toilet, bedpan, or urinal? 4     Putting on and taking off regular upper body clothing? 4     Taking care of personal grooming such as brushing teeth? 4     Eating Meals 4     Total Raw Score 22     CMS Score - Calculated 25.80%        Additional Comments    Additional Comments pt pleasant and willing to participate in session. states no concerns for returning home        OT Treatment Assessment    Assessment D/C acute OT        Plan    OT Frequency One-time visit     No acute OT needs Pt demonstrates adequate ADL skills for return to prior living environment;No acute OT goals identified        Recommendation    OT Discharge Recommendations Prior Living Environment;Intermittent supervision   assist with IADLs/TEDs PRN    OT Discharge Equipment Recommended None     OT Hospital Stay Recommendations 1A RW to bathroom     OT Next Visit n/a     OT needs to see patient prior to DC   No        Multidisciplinary Communication    Multidisciplinary Communication Patient, PT, RN, SO        Patient/Family/Caregiver Training    Patient/Family/Caregiver training Yes     Patient/Family/Caregiver training Role of occupational therapy in hospital and plan for evaluation;Discharge planning;OT plan of care after evaluation;Post-operative precautions;Purpose of and importance of icing and recommendations for frequency;Equipment recommendations for discharge;Benefit of OOB activity and participation in ADLs while inpatient;Compression stocking education and wearing schedule;Energy conservation techniques;Call don't fall, purpose of bed/chair  alarm, and recommendations for nursing        Time Calculation    OT Timed Codes 0     OT Untimed Codes 26     OT Unbilled Time 0     OT Total Treatment 26        Plan and Onset date    Plan of Care Date 01/07/23     Onset Date 01/07/23     Treatment Start Date 01/07/23                       AMPAC Score Interpretation:  Adapted from Roxanne Gates, et al Association of AM-PAC "6 Clicks" Basic Mobility and Daily Activity Scores with discharge destination, 2021  AM-PAC Raw Score of > 19 (standardized score > 40.22 %) indicates a high likelihood of discharge to home     Adapted from [employer] al "Validity of h AM-PAC "6- Clicks" Inpatient Daily Activity and Basic Mobility Short Forms (2014)   AM-PAC raw score  ? 18 (standardized score ? 39, % impairment  < 47%) indicates a high probability of a discharge to home  AM-PAC raw score  ? 13 (standardized score ? 32.03, % impairment 63%) indicates pt is more likely to require IRF/SNF R  AM-PAC raw score <12 (standardized score < 30.6 % impairment > 66%) indicates pt is more likely to require Long Term Care    Westley Gambles, OT   Please utilize [employer] or Web paging for communication

## 2023-01-07 NOTE — Progress Notes (Signed)
SW made aware per chart review, patient will need a RW. Order sent to [facility].    Lazarus Gowda, LMSW, MS  [place] [facility] Social Worker  Office Phone [phone]  Pager [phone]  01/07/2023  2:44 PM

## 2023-01-07 NOTE — Anesthesia Procedure Notes (Signed)
----------------------------------------------------------------------------------------------------------------------------------------    Adductor Canal Nerve Block      Date of Procedure: 01/07/2023 8:08 AM    Laterality:  Left    Injection Technique: Single-shot    Reason for Block: Post-op pain management        Patient Location: Pre-op  CONSENT AND TIMEOUT     Consent:  Obtained per policy    Timeout Documented by Nursing  METHOD     Patient Position:  Supine    Monitoring:  Blood pressure, Continuous pulse ox with supplemental oxygen and EKG    Sedation Used:  Yes    Level of Sedation: Minimal              for meds injected see MAR portion of chart      Sterile Technique:  Hand sanitizing, hat, mask and sterile gloves    Prep:  Aseptic technique per protocol and Chloraprep      Approach:  In-plane and Anterior and Lateral    Technique: Ultrasound guided       Attempts (Skin Punctures):  1   NEEDLE     Type:  Short-bevel     Gauge: 22 G     Length: 10 cm     Needle Insertion Depth: 5 cm  BLOCK EVENTS      No Paresthesia with needle     No Paresthesia with injection     No significant resistance to injection     No significant pain on injection     No blood aspirated     No intravascular injection     Well Tolerated  ----------------------------------------------------------------------------------------------------------------------------------------

## 2023-01-07 NOTE — Discharge Instructions (Addendum)
Surgical date: 01/07/2023 TOTAL KNEE ARTHROPLASTY    Helpful Telephone Numbers:  Nurse Navigator: [phone] Monday through Friday 8 am to 4:30 pm (for questions and concerns)  After Hours: [phone] (weekends and M-F after 4:30pm) (for questions and concerns that need to be addressed after office hours)  Appointment line: [phone] make, change, or schedule an appointment)  Surgeons office: Dr. Lucretia Kern - Office Phone [phone]    You or your caregiver should call your surgeon's nurse navigator's line at [phone] for:  Redness around the incisions   Continuous drainage or bleeding from incision sites (a small amount of drainage is expected initially)  Temperature above 101 F  Pain not controlled by prescribed pain medications   Any other worrisome condition    If you feel you are having a Medical Emergency please present to the [facility] Emergency Department    You or your caregiver should call your surgeons office for any prescription refill requests.    After hours and on weekends there is always someone on call, available through the answering service [phone] to address your concerns.  Please don't hesitate to reach out with your questions.   In the event that you are unable to contact the office or the answering service, please go to [facility] Emergency Department for evaluation for urgent concerns.    Care[employer] Questionnaire Reminder  Two and four days after surgery, you will receive questionnaires through [employer]. Please complete them both using the app or the link in that day's [employer] email. Your Nurse Navigators will be checking in with you as they were before surgery, and your responses will help them assess your progress    Thank you for choosing [facility] for your joint replacement.  Please adhere to the following instructions in order to maximize your recovery.    Please follow the instructions next to any check box that has  been checked i.e. [x]     Activity:  You should be weight bearing as tolerated on your operative extremity. Walk and stair-climb as tolerated.   Use assistive devices (walker, crutches, cane) as instructed by your Physical Therapist.   Do not lift, carry, push or pull objects heavier than 10 pounds until cleared by your surgeon.   Driving is permitted once you are functionally cleared to do so by your surgeon AND you are no longer taking narcotic pain medications.     Diet:  You may resume your previous diet, as tolerated.   Please aim for protein intake of approximately 100 grams per day to promote healing. (Meats, fish, peanut butter, and Ensure/Boost shakes are all good sources of protein.)  If you feel nauseous or develop vomiting, go back to just clear liquids, increasing to a soft diet and then regular solid foods as tolerated.  Smoking and/or nicotine use are associated with poor wound healing and increased risk of infection. Therefore it is recommended you not smoke or use nicotine for as long as possible following surgery.  If you are diabetic, it is important to keep your blood sugars well-controlled. Poorly controlled blood sugars are associated with poor wound healing and increased risk of infection.     Labs:  [x]    None required.  []    Your *** should be rechecked on ***. Results will be sent to your surgeon's office as well as your PCP.      Preventing Blood Clots (Deep Vein Thrombosis/Pulmonary Embolism or DVT/PE):  Please wear your anti-embolism stockings for 14  days. They should be worn during the day and removed at night - unless instructed otherwise.      You will be prescribed a blood thinner to help minimize the increased risk of blood clots following surgery. Please refer to your MEDICATION LIST for details. While taking blood thinners, you may find you bruise or develop nose bleeds more easily. You should watch for signs of gastrointestinal bleeding, which may include dark, tar-like stools or  vomit with the appearance of coffee-grounds. You should call your surgeon if you notice these signs.   You have been instructed to take 81mg  of enteric coated aspirin twice daily for 4 weeks for deep vein thrombosis prophylaxis (blood clot prevention)      Stomach irritation prevention:        [x]   While you are on Aspirin therapy to prevent blood clots, you have been instructed to take pepcid (famotidine) twice a day or a PPI (protonix, prevacid etc) daily to help reduce gastric irritation and protect   your stomach lining as aspirin can be abrasive to the stomach               [x]   While you are on NSAID (mobic/celebrex) therapy for pain control, you have been instructed to take pepcid (famotidine) twice a day, or a PPI (protonix, prevacid etc) daily to help reduce gastric irritation and protect your stomach lining as these medications can be abrasive to the stomach     Post-operative course:  Swelling:  It is common to have swelling in your knee up to 6 to 12 months after surgery depending on your activity.   You may also experience a stiff knee in the morning and swollen in the evening.     Bruising:  It is common and normal for bruising to occur post-operatively.  Bruising can develop on your thigh, calf, foot and ankle following surgery  Bruises can be painful to touch as they resolved and can take several weeks to disappear.    Ice & Elevation:  Icing is essential for pain/swelling reduction and should be continued as needed for months after surgery.   Apply ice for 20-30 minutes at a time, with a minimum of a 30-minute break in between the icing sessions.   Combine icing with elevation of your extremity.   Elevate your foot on pillows so your foot is above the level of your heart and your ankle is above your knee.   Compression:  Frequent use of wearing an ACE wrap is strongly encouraged to help reduce swelling post-operatively.  For 2 weeks after surgery, use an ACE wrap on your surgical leg for a maximum  of 12 hours per pay.  Adjust ACE wrap as needed if it feel too tight or too loose.     Managing your pain:  Please refer to your MEDICATION LIST for detailed instructions on your personalized pain protocol.  You should apply ice for 30 minutes every 1-2 hours and elevate your extremity above the level of your heart 3 times a day and during rest to help reduce swelling and pain.  Narcotic medications are often necessary for a short time after total joint surgery. While you are taking narcotics:  Do NOT drive or operate heavy machinery.  Do NOT drink alcohol.  Do NOT make any important personal, business, or economic decisions or sign legal documents.  Narcotics can cause side effects such as sedation, lethargy, body-wide itching, nausea, vomiting and constipation.  You should work to gradually  wean off of narcotic pain medications as your pain improves, first by decreasing the dose, and then by increasing the amount of time in between doses.    It is important to dispose of all narcotics when you are certain you no longer need them. The first choice for all drug disposal is an authorized medication disposal bin, which is located at the [facility] pharmacy, most retail pharmacies, and law enforcement stations. If you are unable to access one of these bins, you should flush unused narcotics down the toilet.  Please call your surgeon's office M-F before 4:30pm for prescription refill requests     Constipation:  After surgery you are at an increased risk of constipation because of reduced movement/activity levels as well as opioids/narcotics use.  To prevent/alleviate constipation, you should walk as much as you can tolerate and drink plenty of fluids.   You should use Miralax and Senna (laxatives), as directed, unless you develop diarrhea or loose stool. These two medications have been prescribed to you. Please refer to your medication list for details.   If you have not had a bowel movement by your 3rd day after  surgery:  Please INCREASE Senna to 2 tablets by mouth 2 times daily  Please INCREASE Miralax, 1 capful in 8oz of juice/liquid 2 times daily   Please consider taking magnesium citrate, per bottle instructions, to help facilitate a bowel movement  Please utilize your pharmacist or please call your PCP/surgeon's office, if you have any concerns about the addition of these medications to your current home regimen.    Caring for your incision:   Your incision is covered with a surgical dressing.  Leave this dressing in place as long as it remains adhered. Likely, it will remain adhered until the time of your follow up appointment. After two weeks your dressing may be removed. After two weeks if your incision is dry it may be left open to air and you may shower  If you have drainage from your incision: You may not shower. Call your surgeon's office.  If there is no drainage you may shower with intact dressing in place. If there's any sign that the dressing is peeling at any corner, or wrinkled, cover with plastic wrap before showering. Do not soak your wound in a bath, whirlpool, or in any kind of standing water. If you have a prevena dressing in place you need to be seen in office for follow up.  Underneath the dressing you have surgical staples or sutures that may be removed by the visiting nurse as part of your home care. They should not be removed before 14 days after surgery.  If your bandage has standing fluid beneath it, is >80% soiled, or has become mostly unattached from the skin, remove and cover with a dry dressing and tape until follow-up. Do not shower and do not allow incision to get wet.       Follow-up:  Call your surgeon's office at [phone] to make a follow-up appointment.     Your surgeon requests that you do not receive the Flu or Covid vaccine until 4 weeks have passed since your surgical date     URINARY RETENTION:     While you were admitted to the hospital, you were found to have urinary  retention, the inability to fully empty your bladder. This has improved but requires ongoing mindfulness. We recommend that you:    Spread out the amount of fluid you drink throughout the day.  Do not drink a lot at bedtime  Urinate as soon as you feel the need/urge or at least every 3 hours while you are awake.   Urinate right before you go to bed.  Avoid alcohol and caffeine.    Seek prompt attention/medical advice if:  You cannot urinate at all or it is getting harder to urinate.  You have symptoms of a urinary tract infection. These may include:  Pain or burning when you urinate.  Pain in the flank (which is just below the rib cage and above the waist on either side of the back).   Blood in your urine.  Fever.    While admitted, you were started you on a medication called flomax due to your urinary retention. It is recommended that you take flomax for 1-2 weeks after your discharge from the hospital. This medication can cause your blood pressure to lower, which can be aggravated by change in position. We suggest that you pause between transitioning from laying to sitting to standing, to give your body time to regulate. If you have dizziness, or feel as if you are going to pass out, call/seek medical attention.     Please make sure to intake adequate oral fluids to maintain hydration.

## 2023-01-07 NOTE — INTERIM OP NOTE (Signed)
Assessment/Plan: Timothy Cross is a 63 y.o. male admitted on 01/07/2023 who is now  s/p left total knee arthroplasty on 01/07/2023       RTOR: No   Pain control: Tylenol ATC, Oxy PRN, Dilaudid for breakthrough pain   Antibiotics: Periop Ancef   Imaging: AP L knee in PACU  Foley: No  Bowel regimen on board  Diet: Regular   Weight-bearing status: WBAT LLE, ice/elevate   Dressing: Mepilex   Additional Specification: None  PT/OT & OOB    DVT prophylaxis: ASA   Disposition: Home pending PT/Pain control        Please page orthopaedics with any questions about DVT ppx, Abx, WB status and follow up prior to discharge. Please do not discontinue DVT ppx at time of discharge unless specifically instructed.     Interim Op Note (Surgical Log ID: [id number])       Date of Surgery: 01/07/2023       Surgeons: Surgeons and Role:     * Laurance Flatten, MD - Primary     * Franne Grip, MD - Resident - Assisting   Assistants: RN First Assistant: Gabriel Rung, RN       Pre-op Diagnosis: Pre-Op Diagnosis Codes:     * Primary osteoarthritis of left knee [M17.12]       Post-op Diagnosis: Post-Op Diagnosis Codes:     * Primary osteoarthritis of left knee [M17.12]       Procedure(s) Performed: Procedure(s) (LRB):  LEFT ARTHROPLASTY, KNEE, TOTAL (Left)       Anesthesia Type: Spinal        Fluid Totals: I/O this shift:  07/29 0700 - 07/29 1459  In: 1000 (14.6 mL/kg) [I.V.:1000]  Out: - (0 mL/kg)   Net: 1000  Weight: 68.5 kg        Estimated Blood Loss: * No values recorded between 01/07/2023  7:38 AM and 01/07/2023  9:27 AM *       Specimens to Pathology:  * No specimens in log *       Temporary Implants:        Packing:                 Patient Condition: good       Findings (Including unexpected complications): See OP note.      Signed:  Franne Grip, MD  on 01/07/2023 at 9:27 AM

## 2023-01-07 NOTE — Plan of Care (Signed)
Home Health Assessment    Completed by: Sharon Seller, RN- [facility] coordinator  Phone: [phone]    Referred by: social work     Source of Information: medical record      Home Health indicators present: s/p Left Tka    Barriers to discharge to be addressed: medical/therapy clearance for home     Plan: home care therapy  for assessment/teaching at discharge      Comments: medical record reviewed for home care  Will follow with pt re d/c plan/home care needs

## 2023-01-08 ENCOUNTER — Other Ambulatory Visit: Payer: Self-pay

## 2023-01-08 ENCOUNTER — Encounter: Payer: Self-pay | Admitting: Orthopedic Surgery

## 2023-01-08 MED ORDER — SODIUM CHLORIDE 0.9 % IV BOLUS *I*
1000.0000 mL | Freq: Once | Status: AC
Start: 2023-01-08 — End: 2023-01-08
  Administered 2023-01-08: 1000 mL via INTRAVENOUS

## 2023-01-08 MED ORDER — GABAPENTIN 300 MG PO CAPSULE *I*
300.0000 mg | ORAL_CAPSULE | Freq: Every evening | ORAL | 0 refills | Status: AC
Start: 2023-01-08 — End: 2023-01-22
  Filled 2023-01-08: qty 14, 14d supply, fill #0

## 2023-01-08 MED ORDER — MELOXICAM 15 MG PO TABS *I*
15.0000 mg | ORAL_TABLET | Freq: Every day | ORAL | 0 refills | Status: AC
Start: 2023-01-09 — End: 2023-01-23
  Filled 2023-01-08: qty 14, 14d supply, fill #0

## 2023-01-08 MED ORDER — ROLLER WALKER MISC *A*
Freq: Every day | 0 refills | Status: DC
Start: 2023-01-08 — End: 2023-02-19

## 2023-01-08 MED ORDER — TAMSULOSIN HCL 0.4 MG PO CAPS *I*
0.4000 mg | ORAL_CAPSULE | Freq: Every evening | ORAL | Status: DC
Start: 2023-01-08 — End: 2023-01-08
  Administered 2023-01-08: 0.4 mg via ORAL
  Filled 2023-01-08: qty 1

## 2023-01-08 MED ORDER — TAMSULOSIN HCL 0.4 MG PO CAPS *I*
0.4000 mg | ORAL_CAPSULE | Freq: Every evening | ORAL | 0 refills | Status: DC
Start: 2023-01-08 — End: 2023-02-19
  Filled 2023-01-08: qty 7, 7d supply, fill #0

## 2023-01-08 MED ORDER — OXYCODONE HCL 5 MG PO TABS *I*
5.0000 mg | ORAL_TABLET | ORAL | 0 refills | Status: DC | PRN
Start: 2023-01-08 — End: 2023-02-19
  Filled 2023-01-08: qty 42, 4d supply, fill #0

## 2023-01-08 MED ORDER — MORPHINE SULFATE ER 15 MG PO TBCR *I*
15.0000 mg | ORAL_TABLET | Freq: Two times a day (BID) | ORAL | 0 refills | Status: DC
Start: 2023-01-08 — End: 2023-01-08
  Filled 2023-01-08: qty 10, 5d supply, fill #0

## 2023-01-08 NOTE — Plan of Care (Signed)
Patient became hypotensive and lightheaded while ambulating to bathroom. Additional 1L bolus was ordered with improvement in BP. MSContin was discontinued at discharge. Encouraged adequate PO fluid intake.     Bevelyn Buckles Mae Denunzio, PA 01/08/2023 1:32 PM

## 2023-01-08 NOTE — Discharge Summary (Signed)
Name: Timothy Cross MRN: [id number] DOB: 1960/08/29     Admit Date: 01/07/2023   Date of Discharge: 01/08/2023     Patient was accepted for discharge to   [facility] [6]       Discharge Attending Physician: Laurance Flatten, MD      Hospitalization Summary    Concise Narrative: [name] was admitted for elective arthroplasty. Patient found to have urinary retention post-operatively requiring straight catheterization x1 for 800cc. Flomax was initiated, and PVRs were monitored and improved. Their post operative course was otherwise without significant events. Patient was discharged to home          OR Procedure: Left knee total arthroplasty 01/07/2023                  Significant Med Changes: Yes  Chemical DVT prophylaxis was initiated. Detailed in [facility]. Analgesics tailored to tolerance and adequate pain management.   +Flomax 0.4mg  nightly x7d                   Signed: Minna Antis, PA  On: 01/08/2023  at: 10:48 AM

## 2023-01-08 NOTE — Progress Notes (Signed)
Patient has cleared PT, OT, is voiding spontaneously, and has had x-ray completed.  Patient has achieved adequate pain control and is amenable to discharge.  Discharge instructions reviewed with patient and patient stated understanding.  IV removed.  Patient discharged to home as per order. Sarah Stanley, RN

## 2023-01-08 NOTE — Plan of Care (Signed)
Medical record reviewed for home care, met with pt re home care services   Plan for home today  will follow for progress to d/c /home care needs  Sharon Seller [facility], [place] [place] [facility] [place]   [phone]        For weekend home care need/discharge please contact [facility] AT [phone]  Fax [phone] or [facility] social work covering the unit

## 2023-01-08 NOTE — Anesthesia Postprocedure Evaluation (Signed)
Anesthesia Post-Op Note    Patient: Timothy Cross    Procedure(s) Performed:  Procedure Summary  Date:  01/07/2023 Anesthesia Start: 01/07/2023  7:38 AM Anesthesia Stop: 01/07/2023  9:33 AM Room / Location:  H_OR_15 / HH MAIN OR   Procedure(s):  LEFT ARTHROPLASTY, KNEE, TOTAL Diagnosis:  Primary osteoarthritis of left knee [M17.12] Surgeon(s):  Laurance Flatten, MD  Franne Grip, MD Responsible Anesthesia Provider:  Herbie [place] ALI, MBBS         Recovery Vitals  BP: 98/53 (01/08/2023 12:53 PM)  Heart Rate: 54 (01/08/2023 12:53 PM)  Heart Rate (via Pulse Ox): 55 (01/07/2023 10:15 AM)  Resp: 18 (01/08/2023  7:16 AM)  Temp: 36.5 C (97.7 F) (01/08/2023 11:30 AM)  SpO2: 96 % (01/08/2023 11:30 AM)  O2 Flow Rate: 10 L/min (01/07/2023  9:18 AM)   0-10 Scale: 4 (01/08/2023  2:11 PM)    Anesthesia type:  spinal  Complications Noted During Procedure or in PACU:  None   Comment:    Patient Location:  Med Surgical Floor  Level of Consciousness:    Recovered to baseline  Patient Participation:     Able to participate  Temperature Status:    Normothermic  Oxygen Saturation:    Within patient's normal range  Cardiac Status:   within patient's normal range  Fluid Status:    Stable  Airway Patency:     Yes  Pulmonary Status:    Baseline  Neuraxial Block Evaluation:    No residual motor or sensory symptoms  Pain Management:    Adequate analgesia  Nausea and Vomiting:  None    Post Op Assessment:    Tolerated procedure well  Responsible Anesthesia Provider Attestation:  All indicated post anesthesia care provided         Complications Noted During Recovery Period:      None  Recovery from Anesthesia:      Recovered to baseline level of consciousness  Condition of patient:      Recovered to pre-anesthetic condition

## 2023-01-08 NOTE — Progress Notes (Addendum)
Orthopaedic Surgery Progress Note for 01/08/2023    Patient:Timothy Cross  MRN: [id number]  DOA: 01/07/2023    Subjective:   65 M s/p left total knee arthoplasty 7/29. Vitals wnl. Pain under control. Tolerating PO intake. SC 1x post op now voiding spontaneously. Cleared PT. HCT 40. Patient denies fever/chills, SOB, chest pain, nausea/vomiting, or numbness/weakness.     Objective:  Temp:  [36.2 C (97.2 F)-36.9 C (98.4 F)] 36.9 C (98.4 F)  Heart Rate:  [49-72] 55  Resp:  [12-22] 15  BP: (100-138)/(53-76) 109/63      Recent Labs   Lab 01/07/23  1509   Hemoglobin 13.2   Hematocrit 40     Recent Labs   Lab 01/07/23  1509   Sodium 136   Potassium 4.7   Chloride 101   CO2 27     No components found with this basename: "BUN", "LABGLOM", "CALCIUM"    No results for input(s): "APTT", "INR", "PTT" in the last 168 hours.  No results for input(s): "ESR", "CRP" in the last 168 hours.    Exam:  NAD  No respiratory distress    LLE: Dressing c/d/i, SILT sp/dp/saph/sur/tib nerve distributions, SILT over tips of toes.Able to ADF/APF. Able to flex and extend toes. 2+ DP and PT pulses.    Imaging:  Xrays left knee obtained post-operatively reviewed demonstrating intact hardware in appropriate alignment.          Assessment/Plan: Timothy Cross is a 62 y.o. male admitted on 01/07/2023 who is now  s/p left total knee arthroplasty on 01/07/2023        RTOR: No   Pain control: Tylenol ATC, Oxy PRN, Dilaudid for breakthrough pain   Antibiotics: Periop Ancef   Imaging: AP L knee in PACU  Foley: No  Bowel regimen on board  Diet: Regular   Weight-bearing status: WBAT LLE, ice/elevate   Dressing: Mepilex   Additional Specification: None  PT/OT & OOB    DVT prophylaxis: ASA   Disposition: Home likely later today.              Franne Grip, MD on 01/08/2023 at 4:30 AM    Patient seen and evaluated this AM, resident note reviewed, and I agree with the above findings & treatment plan.     Laurance Flatten, MD 01/08/2023 12:03 PM

## 2023-01-10 ENCOUNTER — Telehealth: Payer: Self-pay

## 2023-01-10 NOTE — Telephone Encounter (Signed)
The following are the patient's responses to the POD # 2 CARESENSE QUESTIONS    Are you tolerating food and drinks?  Yes     Have you had a bowel movement since your surgery?  Yes     Is your pain being tolerated well with pain medication?  Yes     Are you wearing your white stockings/TEDs/compression stockings?  Yes     Are you taking your blood thinners? Examples: aspirin, Lovenox, Coumadin, Xarelto, and Eliquis.  Yes     Are you walking every hour while awake?  Yes     Instructed patient to call the Orthopedic Nurse Navigators with any questions or concerns

## 2023-01-15 ENCOUNTER — Encounter: Payer: Self-pay | Admitting: Gastroenterology

## 2023-01-18 ENCOUNTER — Encounter: Payer: No Typology Code available for payment source | Admitting: Primary Care

## 2023-01-22 ENCOUNTER — Encounter: Payer: Self-pay | Admitting: Orthopedic Surgery

## 2023-01-22 ENCOUNTER — Ambulatory Visit: Payer: No Typology Code available for payment source | Attending: Orthopedic Surgery | Admitting: Orthopedic Surgery

## 2023-01-22 ENCOUNTER — Other Ambulatory Visit: Payer: Self-pay

## 2023-01-22 VITALS — BP 118/70 | HR 59 | Ht 71.25 in | Wt 150.5 lb

## 2023-01-22 DIAGNOSIS — Z96652 Presence of left artificial knee joint: Secondary | ICD-10-CM

## 2023-01-22 NOTE — Progress Notes (Signed)
ADULT POST-OP TKA    HPI: Timothy Cross presents today for a 2 week(s) post-op visit following a left total knee arthroplasty performed on  01/07/2023 . Patient states pain is 2/10 in severity. Pain is well controlled with acetaminophen and ice, rest and elevation as needed , no longer requiring oxycodone.  The patient is performing home exercises and participating in home physical therapy.  Range of motion has been progressively improving. The patient is using a cane for ambulation. The patient is taking aspirin twice daily and wearing compression stockings for DVT prophylaxis. Denies fever, chills, SOB. Denies lower extremity numbness or tingling. Denies drainage or redness from surgical incision. States an overall improvement with knee pain symptoms from perioperative baseline.    EXAM: Well-developed, well-nourished, appropriately dressed, healthy appearing 62 y.o.-year-old male in no apparent distress. Mood and affect appropriate. A+O x3. Pt seated comfortably in chair. Able to rise independently from seated position.     Focused musculoskeletal exam was performed today and demonstrates:  No antalgic gait without an observable limp   Left knee ROM 0-100   Knee joint is grossly stable   Surgical incision is healing well, no drainage, no erythema, no swelling, well approximated. There is not significant effusion   Calf is soft and nontender, distal extremity CMS intact  5/5 DF/PF, ITLT tibial and peroneal nerve, 2+ DP/PT pulse    Radiographs were not obtained at today's visit. Previous x-rays were reviewed, showing a total knee arthroplasty with normal alignment.    ASSESSMENT: s/p left TKA 01/07/2023    PLAN:  The patient's x-ray findings and underlying diagnosis reviewed. Patient was seen and evaluated by Dr.Ginnetti as well.  Patient plans to continue to participate in home PT and the home exercise program. Continue with tylenol, ice, rest, and elevation  on an as necessary basis.  Continue using a cane as  needed for gait safety.  Patient's surgical dressing and staples were removed by myself during today's office visit.  Patient may keep incision open to air, and may shower with the site uncovered.  Patient was instructed to avoid baths, hot tubs, and pools until further instructed.  Importance of following dental antibiotic prophylaxis precautions reviewed. Acceptable low-impact exercise activities were reviewed. All ambulatory activities to be performed within the confines of comfort. Additional questions were invited and answered.    Follow up visit at 6 week(s) postop or sooner when necessary for reexamination of the left knee.    Orders Next Visit: 3 views left knee 1 view right knee    Jena Gauss, NP  Division of Adult Reconstruction  Department of Orthopaedic Surgery  [facility] [place]    Answers submitted by the patient for this visit:  Left knee (Submitted on 01/22/2023)  Chief Complaint: LEFT KNEE PAIN HPI  What is your goals for today's visit?: Good post surgical review of knee  Was this the result of an injury?: No  What is your pain level?: 2/10  Please describe the quality of your pain: : aching, burning, fullness, instability, sore, stiffness, tenderness  What diagnostic workup have you had for this condition?: no prior workup  What treatments have you tried for this condition?: surgery  Progression since onset: : gradually improving  Is this a work related condition? : No  Current work status: : no work

## 2023-01-23 ENCOUNTER — Encounter: Payer: Self-pay | Admitting: Gastroenterology

## 2023-01-25 ENCOUNTER — Encounter: Payer: Self-pay | Admitting: Gastroenterology

## 2023-01-28 DIAGNOSIS — H53143 Visual discomfort, bilateral: Secondary | ICD-10-CM | POA: Diagnosis not present

## 2023-01-28 DIAGNOSIS — H5213 Myopia, bilateral: Secondary | ICD-10-CM | POA: Diagnosis not present

## 2023-01-31 ENCOUNTER — Telehealth: Payer: Self-pay | Admitting: Orthopedic Surgery

## 2023-01-31 ENCOUNTER — Other Ambulatory Visit: Payer: Self-pay | Admitting: Orthopedic Surgery

## 2023-01-31 DIAGNOSIS — Z96652 Presence of left artificial knee joint: Secondary | ICD-10-CM

## 2023-01-31 NOTE — Telephone Encounter (Signed)
[facility] [place] called requesting a new referral for the patient to be faxed to them at [phone].    S/P 7.29.24 Lt TKA    Thank you!

## 2023-02-02 DIAGNOSIS — N320 Bladder-neck obstruction: Secondary | ICD-10-CM | POA: Diagnosis not present

## 2023-02-02 DIAGNOSIS — R339 Retention of urine, unspecified: Secondary | ICD-10-CM | POA: Diagnosis not present

## 2023-02-06 ENCOUNTER — Encounter: Payer: Self-pay | Admitting: Gastroenterology

## 2023-02-19 ENCOUNTER — Other Ambulatory Visit: Payer: Self-pay

## 2023-02-19 ENCOUNTER — Ambulatory Visit
Admission: RE | Admit: 2023-02-19 | Discharge: 2023-02-19 | Disposition: A | Discharge status: Home / Self Care | Payer: No Typology Code available for payment source | Source: Ambulatory Visit

## 2023-02-19 ENCOUNTER — Encounter: Payer: Self-pay | Admitting: Orthopedic Surgery

## 2023-02-19 ENCOUNTER — Ambulatory Visit: Payer: No Typology Code available for payment source

## 2023-02-19 ENCOUNTER — Ambulatory Visit: Payer: No Typology Code available for payment source | Attending: Orthopedic Surgery | Admitting: Orthopedic Surgery

## 2023-02-19 VITALS — BP 120/66 | Wt 151.0 lb

## 2023-02-19 VITALS — BP 127/73 | HR 64 | Temp 97.0°F | Ht 71.25 in | Wt 151.0 lb

## 2023-02-19 DIAGNOSIS — E785 Hyperlipidemia, unspecified: Secondary | ICD-10-CM

## 2023-02-19 DIAGNOSIS — K219 Gastro-esophageal reflux disease without esophagitis: Secondary | ICD-10-CM

## 2023-02-19 DIAGNOSIS — Z96652 Presence of left artificial knee joint: Secondary | ICD-10-CM

## 2023-02-19 DIAGNOSIS — Z8546 Personal history of malignant neoplasm of prostate: Secondary | ICD-10-CM

## 2023-02-19 DIAGNOSIS — Z0000 Encounter for general adult medical examination without abnormal findings: Secondary | ICD-10-CM

## 2023-02-19 DIAGNOSIS — Z471 Aftercare following joint replacement surgery: Secondary | ICD-10-CM

## 2023-02-19 NOTE — Progress Notes (Signed)
ADULT POST-OP TKA    HPI: HRISTOPHER HOPMAN presents today for a 6 week(s) post-op visit following a left total knee arthroplasty performed on  01/07/23 . Patient states pain is 0/10 in severity. Pain is well controlled with acetaminophen. The patient is performing home exercises and participating in outpatient physical therapy twice weekly.  Range of motion has been progressively improving.  Denies fever, chills, SOB. Denies lower extremity numbness or tingling. Denies drainage or redness from surgical incision. States an overall improvement with knee pain symptoms from perioperative baseline.    EXAM: Well-developed, well-nourished, appropriately dressed, healthy appearing 62 y.o.-year-old male in no apparent distress. Mood and affect appropriate. A+O x3. Pt seated comfortably in chair. Able to rise independently from seated position.     Focused musculoskeletal exam was performed today and demonstrates:  No antalgic gait with an observable limp   Left knee ROM 0-120   Knee joint is grossly stable   Surgical incision is healing well, no drainage, no erythema, well approximated. There is not significant effusion   Calf is soft and nontender, distal extremity CMS intact  5/5 DF/PF, ITLT tibial and peroneal nerve, 2+ DP/PT pulse    Radiographs were obtained and personally reviewed. These show a total knee arthroplasty with normal alignment.    ASSESSMENT: s/p left TKA 01/07/23    PLAN:  The patient's x-ray findings and underlying diagnosis reviewed. Patient was seen and evaluated by Dr.Ginnetti as well.  Patient plans to continue to participate in outpatient PT and the home exercise program. Continue with tylenol, NSAIDs, ice, and rest on an as necessary basis. Importance of following dental antibiotic prophylaxis precautions reviewed. Acceptable low-impact exercise activities were reviewed. All ambulatory activities to be performed within the confines of comfort. Additional questions were invited and answered.    Follow  up visit at 6 week(s) postop or sooner when necessary for reexamination of the left knee.    Orders Next Visit: 3 views left knee    Hollie Beach, NP  Division of Adult Reconstruction  Department of Orthopaedic Surgery  [facility] [place]     Answers submitted by the patient for this visit:  Left knee (Submitted on 02/18/2023)  Chief Complaint: LEFT KNEE PAIN HPI  What is your goals for today's visit?: Review recent surgery  Was this the result of an injury?: No  What is your pain level?: 1/10  Please describe the quality of your pain: : aching, discomfort, fullness, stiffness  What treatments have you tried for this condition?: surgery  Progression since onset: : gradually improving  Is this a work related condition? : No  Current work status: : limited work activities  Questionnaire about: LEFT KNEE PAIN HPI (Submitted on 02/18/2023)  Chief Complaint: LEFT KNEE PAIN HPI

## 2023-02-19 NOTE — Progress Notes (Signed)
Annual Physical   02/19/2023     SUBJECTIVE:   Timothy Cross is a 62 y.o. male who presents today for an annual physical exam.  History of Present Illness  The patient is a 62 year old male who presents today for an annual physical exam.    He has a past medical history of hyperlipidemia, left total knee arthroplasty in 12/2022, history of prostate cancer, and GERD.    Regarding his knee condition, he reports no improvement since the day before surgery. He experiences stiffness and achiness upon waking up. He is currently undergoing physical therapy twice a week, which he initially did at home for the first 2 weeks. He has completed 6 sessions so far and plans to continue. He is scheduled to meet with his surgeon today. He also mentions taking Tylenol as needed and has noticed some swelling. He reports no chest pain or shortness of breath.    He has not had an eye examination since 1985. He uses reading glasses and occasionally experiences blurry vision after waking up. He also reports having itchy eyes a few days ago.    He has noticed dry spots on the edge of his beard and small patches on his eyebrow and beard line, which are painful and sensitive. He describes these as dry skin that feels like a scab, but the pain subsides once he removes them. He applies sunscreen when going out in the sun and has seen a dermatologist once.    He has regular bowel movements and reports no swollen lymph nodes or puffy areas. He suspects a recurrence of his hernia, although it is not causing him discomfort. He has had two hernia surgeries in the past. He reports no pain with swallowing or any bulges or bumps in the neck.    He is taking Cialis as needed and amoxicillin before dental procedures. He is not taking Flomax, oxycodone, Pepcid, or any bowel medications. His acid reflux is doing fine, and he has not had any pain for a couple of months. He is still following up with the urology group and has an appointment on  03/04/2023.      PHQ9 scores   His depression is minimal (0-4) based on a PHQ Total Score: 0 and this has made it Not difficult at all for him to do his work, take care of things at home, or get along with other people.     Medications were reviewed and updated if needed.   Current Outpatient Medications on File Prior to Visit   Medication Sig Dispense Refill    oxyCODONE (ROXICODONE) 5 mg immediate release tablet Take 1-2 tablets (5-10 mg total) by mouth every 4 hours as needed for Pain Following an Operation. Max daily dose: 12 tablets Take 1 tablet for mild to moderate pain and 2 tablets for severe pain. 42 tablet 0    tamsulosin (FLOMAX) 0.4 mg capsule Take 1 capsule (0.4 mg total) by mouth every evening for 7 days. 7 capsule 0    Misc. Devices (ROLLER WALKER) By no specified route daily. Rolling walker. Ht Readings from Last 1 Encounters:  01/07/23 : 1.829 m (6')  Wt Readings from Last 1 Encounters:  01/07/23 : 68.5 kg (151 lb)  ZOX:[id number] ICD10:17.12 1 each 0    acetaminophen (TYLENOL) 500 mg tablet Take 2 tablets (1,000 mg total) by mouth 3 times daily as needed for Pain Following an Operation. 100 tablet 0    polyethylene glycol (GLYCOLAX) powder mix 17g (1 capful)  with 8oz of juice or water and drink daily for Treatment for the Prevention of Constipation. If no bowel movement in 2 days increase to 1 cap (17g) twice daily. Hold for loose stools 238 g 0    senna (SENOKOT) 8.6 mg tablet Take 2 tablets by mouth nightly for Treatment for the Prevention of Constipation. Hold for loose stools 100 tablet 0    famotidine (PEPCID) 20 mg tablet Take 1 tablet (20 mg total) by mouth 2 times daily for Treatment to Prevent NSAID-Induced Stomach Ulcer. 60 tablet 0    tadalafil (CIALIS) 20 MG tablet take 1 tablet (20 MILLIGRAM total) by mouth daily as needed for erectile dysfunction take at least 30 minutes prior to sexual activity. 90 tablet 3    amoxicillin (AMOXIL) 500 MG capsule Take 4 capsules 1 hour prior to dental  procedure 20 capsule 2     No current facility-administered medications on file prior to visit.      Allergies   Allergen Reactions    Environmental Allergies Other (See Comments)     Pollen - sneezing, hay fever    Poison Ivy Extract Hives      Immunization History   Administered Date(s) Administered    Covid-19 mRNA vaccine (PFIZER) IM 30 mcg/0.1mL 10/12/2019, 11/02/2019, 05/17/2020    Hep B/HiB (Comvax) 03/17/2013, 04/17/2013    Hepatitis A Adult 02/24/2013    IPV 02/24/2013    Influenza Adult(42yr and up) 02/28/2012    Influenza Quad 0.45mL prefilled syringe/single dose vial (FluLaval,Fluzone,Afluria,Fluarix)Historical 03/13/2016, 04/08/2018, 04/16/2019, 04/12/2020, 04/14/2021, 04/19/2022    Influenza Quad Cell-Based prefilled syringe (Flucelvax) 58mo+ Historical 04/08/2017    Influenza, split virus, trivalent, PF 02/24/2013    Tdap 08/21/2006, 08/21/2006, 02/24/2013    Typhoid Inactivated 11/24/2012    Zoster(Shingrix) 12/31/2018, 04/10/2019      Past Medical History:   Diagnosis Date    Anxiety     Arthritis     hip    Cancer     Colon polyp     Inguinal hernia bilateral, non-recurrent 03/06/2016    Left cervical radiculopathy 12/29/2019    Osteoarthritis of right hip 08/25/2019    Rib fracture 2023    left side    S/P left inguinal hernia repair 02/11/2019    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      Family History   Problem Relation Age of Onset    Cancer Mother     Skin Cancer Father     Cancer Brother     Conversion Other         [phone] Intestine Cancer^V16.0^Active^    Anesthesia problems Neg Hx       Social History     Tobacco Use    Smoking status: Never    Smokeless tobacco: Never   Substance Use Topics    Alcohol use: Yes     Types: 1 Glasses of wine, 4 Cans of beer per week     Comment: 3-4 drinks on the weekend       OBJECTIVE:   VITALS:  BP 127/73   Pulse 64   Temp 36.1 C (97 F) (Temporal)   Ht 1.81 m (5' 11.25")   Wt 68.5 kg (151 lb)   SpO2 98%   BMI 20.91 kg/m   BP Readings from  Last 3 Encounters:   02/19/23 127/73   01/22/23 118/70   01/08/23 98/53     Wt Readings from Last 3 Encounters:   02/19/23 68.5 kg (151 lb)  01/22/23 68.3 kg (150 lb 8 oz)   01/07/23 68.5 kg (151 lb)     EKG done in September for Preoperative examination was Normal Sinus Rhythm without major changes from previous tracings. No additional EKG indicated today.     Physical Exam  Constitutional:       General: He is not in acute distress.     Appearance: He is not ill-appearing.   HENT:      Head: Normocephalic and atraumatic.      Right Ear: Tympanic membrane, ear canal and external ear normal. There is no impacted cerumen.      Left Ear: Tympanic membrane, ear canal and external ear normal. There is no impacted cerumen.      Nose: No congestion or rhinorrhea.      Mouth/Throat:      Mouth: Mucous membranes are moist.      Pharynx: Oropharynx is clear. No oropharyngeal exudate or posterior oropharyngeal erythema.   Eyes:      General:         Right eye: No discharge.         Left eye: No discharge.      Conjunctiva/sclera: Conjunctivae normal.   Cardiovascular:      Rate and Rhythm: Normal rate and regular rhythm.      Pulses: Normal pulses.      Heart sounds: Normal heart sounds. No murmur heard.  Pulmonary:      Effort: Pulmonary effort is normal. No respiratory distress.      Breath sounds: Normal breath sounds. No stridor. No wheezing, rhonchi or rales.   Chest:      Chest wall: No tenderness.   Musculoskeletal:         General: Swelling (left knee r/t recent surgery) present.      Cervical back: No tenderness.   Lymphadenopathy:      Cervical: No cervical adenopathy.   Skin:     General: Skin is warm and dry.   Neurological:      Mental Status: He is alert.         ASSESSMENT/PLAN:   Patient is here today for annual physical. Discussed the following below.  Continue to follow up with annual exams.     Discussed drugs, ETOH, tobacco, and depression  Importance of regular dental care  Importance of exercise and  varied diet, minimize junk food and fast food  Discussed importance of adequate dietary Calcium intake  Discussed sleep hygiene  Counseled regarding safety, seat belts and helmets  Counseled regarding safer sex practices and STI's  Counseled about risks of skin cancer and importance of sunscreen protection     Male:   Prostate Screening: Per Urology and Radiation oncology  Colonoscopy over 50: UTD - due in 2026  AAA screening 65-75 if ever smoked: N/A  Lung cancer screening: 55-80 with 20 year pack history:yearly low dose CT lung: N/A    Immunizations:   Yearly Flu shot: Deferred  Tdap every 10 years: UTD  Shingrix over 50: UTD  Pneumovax over 65: UTD  Discuss Prevnar: N/A  Covid-19 vaccine: Declines    Assessment & Plan  1. Postoperative knee stiffness and achiness.  He reports ongoing stiffness and achiness in the knee following left total knee arthroplasty in July. He is currently undergoing physical therapy once a week at [facility]. He was advised to continue physical therapy as recommended by his surgeon. He is using Tylenol as needed but expressed concerns about swelling. Switching to ibuprofen was  discussed, and it was deemed appropriate given his lack of coronary artery disease or hypertension. He will discuss this with his surgeon.    2. GERD.  He reports no recent episodes of acid reflux and has not experienced pain for the past couple of months. No changes in medication were deemed necessary.    3. Suspected eczema.  He has small patches of dry, sensitive skin on his face and beard area. The condition appears more like eczema than psoriasis. He was advised to apply a small amount of Aquaphor, Eucerin, or Vaseline to the affected areas. Vitamin E was also suggested as a potential treatment.    4. Suspected recurrent hernia.  He has noticed a small bump that may indicate a recurrent hernia but reports no discomfort. No obvious breaks in the abdominal wall or palpable lymph nodes were detected. Monitoring the  condition was recommended, with the option for a surgical consultation if symptoms develop.    5. Hyperlipidemia.  Blood work was ordered to assess cholesterol levels. He was reminded to ensure fasting for at least 8 hours before the test. He plans to complete the blood work before his urologist appointment on March 04, 2023.    6. Health Maintenance.  His colon cancer screening is current, with the next colonoscopy due in 2026. An influenza vaccine was recommended for mid to late October 2024. A comprehensive eye exam was suggested to rule out glaucoma or macular degeneration. A dermatology consultation was suggested for a skin check due to the presence of multiple moles on his back. Resources for first aid and Heimlich resuscitation training were provided.      Medications/Orders:     Orders Placed This Encounter   No orders placed during this encounter.        Follow up:   Follow up in about 1 year (around 02/19/2024) for Yearly Check-up., sooner if needed.    Chapman Fitch, NP  02/19/23

## 2023-02-27 ENCOUNTER — Ambulatory Visit: Payer: No Typology Code available for payment source | Admitting: Urology

## 2023-02-27 DIAGNOSIS — Z1283 Encounter for screening for malignant neoplasm of skin: Secondary | ICD-10-CM

## 2023-02-28 ENCOUNTER — Other Ambulatory Visit
Admission: RE | Admit: 2023-02-28 | Discharge: 2023-02-28 | Disposition: A | Discharge status: Home / Self Care | Payer: No Typology Code available for payment source | Source: Ambulatory Visit | Attending: Primary Care | Admitting: Primary Care

## 2023-02-28 DIAGNOSIS — C61 Malignant neoplasm of prostate: Secondary | ICD-10-CM | POA: Insufficient documentation

## 2023-02-28 DIAGNOSIS — N529 Male erectile dysfunction, unspecified: Secondary | ICD-10-CM | POA: Insufficient documentation

## 2023-02-28 DIAGNOSIS — Z0000 Encounter for general adult medical examination without abnormal findings: Secondary | ICD-10-CM | POA: Insufficient documentation

## 2023-02-28 LAB — CBC AND DIFFERENTIAL
Baso # K/uL: 0.1 10*3/uL (ref 0.0–0.2)
Eos # K/uL: 0.4 10*3/uL (ref 0.0–0.5)
Hematocrit: 43 % (ref 37–52)
Hemoglobin: 13.7 g/dL (ref 12.0–17.0)
IMM Granulocytes #: 0 10*3/uL (ref 0.0–0.0)
IMM Granulocytes: 0.3 %
Lymph # K/uL: 1.6 10*3/uL (ref 1.0–5.0)
MCV: 94 fL (ref 75–100)
Mono # K/uL: 0.5 10*3/uL (ref 0.1–1.0)
Neut # K/uL: 3.9 10*3/uL (ref 1.5–6.5)
Nucl RBC # K/uL: 0 10*3/uL (ref 0.0–0.0)
Nucl RBC %: 0 /100 WBC (ref 0.0–0.2)
Platelets: 210 10*3/uL (ref 150–450)
RBC: 4.6 MIL/uL (ref 4.0–6.0)
RDW: 12.9 % (ref 0.0–15.0)
Seg Neut %: 59.9 %
WBC: 6.6 10*3/uL (ref 3.5–11.0)

## 2023-02-28 LAB — VITAMIN D: 25-OH Vit Total: 33 ng/mL (ref 30–60)

## 2023-02-28 LAB — MULTIPLE ORDERING DOCS

## 2023-02-28 LAB — COMPREHENSIVE METABOLIC PANEL
ALT: 10 U/L (ref 0–50)
AST: 16 U/L (ref 0–50)
Albumin: 4.4 g/dL (ref 3.5–5.2)
Alk Phos: 61 U/L (ref 40–130)
Anion Gap: 12 (ref 7–16)
Bilirubin,Total: 0.4 mg/dL (ref 0.0–1.2)
CO2: 26 mmol/L (ref 20–28)
Calcium: 9.5 mg/dL (ref 8.6–10.2)
Chloride: 103 mmol/L (ref 96–108)
Creatinine: 1.03 mg/dL (ref 0.67–1.17)
Glucose: 85 mg/dL (ref 60–99)
Lab: 24 mg/dL — ABNORMAL HIGH (ref 6–20)
Potassium: 3.9 mmol/L (ref 3.3–5.1)
Sodium: 141 mmol/L (ref 133–145)
Total Protein: 6.8 g/dL (ref 6.3–7.7)
eGFR BY CREAT: 82 *

## 2023-02-28 LAB — TSH: TSH: 2.38 u[IU]/mL (ref 0.27–4.20)

## 2023-02-28 LAB — PSA (EFF.4-2010): PSA (eff. 4-2010): 0.03 ng/mL (ref 0.00–4.00)

## 2023-02-28 LAB — HEMOGLOBIN A1C: Hemoglobin A1C: 5.2 %

## 2023-02-28 LAB — LIPID PANEL
Chol/HDL Ratio: 2.6
Cholesterol: 184 mg/dL
HDL: 72 mg/dL — ABNORMAL HIGH (ref 40–60)
LDL Calculated: 101 mg/dL
Non HDL Cholesterol: 112 mg/dL
Triglycerides: 54 mg/dL

## 2023-02-28 LAB — TESTOSTERONE BY IMMUNOASSAY (ADULT MALES OR INDIVIDUALS ON TESTOSTERONE HORMONE THERAPY): Testosterone: 704 ng/dL (ref 193–740)

## 2023-03-01 ENCOUNTER — Other Ambulatory Visit: Payer: Self-pay | Admitting: Urology

## 2023-03-01 DIAGNOSIS — R3989 Other symptoms and signs involving the genitourinary system: Secondary | ICD-10-CM

## 2023-03-04 ENCOUNTER — Other Ambulatory Visit: Payer: Self-pay

## 2023-03-04 ENCOUNTER — Ambulatory Visit: Payer: No Typology Code available for payment source | Admitting: Urology

## 2023-03-04 ENCOUNTER — Encounter: Payer: Self-pay | Admitting: Urology

## 2023-03-04 VITALS — Ht 71.25 in | Wt 151.0 lb

## 2023-03-04 DIAGNOSIS — N529 Male erectile dysfunction, unspecified: Secondary | ICD-10-CM

## 2023-03-04 DIAGNOSIS — R32 Unspecified urinary incontinence: Secondary | ICD-10-CM | POA: Insufficient documentation

## 2023-03-04 DIAGNOSIS — C61 Malignant neoplasm of prostate: Secondary | ICD-10-CM

## 2023-03-04 DIAGNOSIS — R3989 Other symptoms and signs involving the genitourinary system: Secondary | ICD-10-CM

## 2023-03-04 LAB — POCT URINALYSIS DIPSTICK
Glucose,UA POCT: NORMAL mg/dL
Ketones,UA POCT: NEGATIVE mg/dL
Leuk Esterase,UA POCT: NEGATIVE
Lot #: 73386502
Nitrite,UA POCT: NEGATIVE
PH,UA POCT: 6 (ref 5–8)
Specific gravity,UA POCT: 1.02 (ref 1.002–1.030)

## 2023-03-04 NOTE — Letter (Signed)
March 04, 2023    Delma Officer, MD  [address]  [place]  [place]  [place] [address]    Fax#:  [phone]    RE:   Timothy Cross, Timothy Cross  DOB:  1960/07/22  Unit#:  [id number]  CSN:  [id number]    Dear Baxter Hire:    This is just a brief followup on your patient, Timothy Cross, who, as you know, has a history of both Gleason 3 + 4 and Gleason 3 + 3 prostate cancer that was clinical stage cT1c and a PSA at 4.18 at the time of diagnosis.  He underwent prostate seed implant brachytherapy 5-1/2 years ago with 125 iodine seeds.  At that time, he already had hematuria and was cystoscoped at the end of the procedure and nothing abnormal was seen in his bladder.  Today, he returns to see [place] and he does have a small amount of heme in his urine.  He also has a little bit of leaking of urine, although it isn't accompanied by any urgency and this only happens very periodically (about 5 times in the last 6 months.)  His creatinine, at 1.03 and testosterone over 700 are certainly normal and his PSA continues to descend at 0.03.  He does take Cialis to help him have erections at the maximal dose and this is not really helping, so, hopefully, one of my colleagues who treats erections will be able to see him in the not too distant future.  We will try to set that appointment up.  In the meantime, we did examine him today and exam of node-bearing areas, abdomen, external genitalia, phallus, hernia canals, and rectum are normal.  His prostate is really quite flat on exam.  He will be seeing one of our urologists who does work with ED patients within the next month or so but, in addition, will see [place] again in a year, getting labs before that visit.  If his voiding worsens during that time, if he develops gross hematuria or other difficulties, we will see him back right away.      Again, thank you for allowing [place] to be involved in the care of your patient.  We will, of course, keep you informed of all urological followup.            Deitra Mayo, MD, FACS    EMM/MODL  DD:  03/04/2023 10:12:48  DT:  03/04/2023 10:44:22  Job #:  [id number]    cc:  Hilaria Ota, MD  [address]  [place], [place] [address]

## 2023-03-05 ENCOUNTER — Encounter: Payer: Self-pay | Admitting: Primary Care

## 2023-03-05 NOTE — Progress Notes (Signed)
Dictated Note

## 2023-05-02 ENCOUNTER — Other Ambulatory Visit: Payer: Self-pay | Admitting: Urology

## 2023-05-02 DIAGNOSIS — R3989 Other symptoms and signs involving the genitourinary system: Secondary | ICD-10-CM

## 2023-05-02 DIAGNOSIS — C61 Malignant neoplasm of prostate: Secondary | ICD-10-CM

## 2023-05-03 ENCOUNTER — Other Ambulatory Visit: Payer: Self-pay

## 2023-05-03 ENCOUNTER — Ambulatory Visit: Payer: No Typology Code available for payment source | Attending: Urology | Admitting: Urology

## 2023-05-03 VITALS — Ht 72.0 in | Wt 155.0 lb

## 2023-05-03 DIAGNOSIS — N529 Male erectile dysfunction, unspecified: Secondary | ICD-10-CM

## 2023-05-03 LAB — POCT URINALYSIS DIPSTICK
Blood,UA POCT: NEGATIVE
Glucose,UA POCT: NORMAL mg/dL
Ketones,UA POCT: NEGATIVE mg/dL
Leuk Esterase,UA POCT: NEGATIVE
Lot #: 73386502
Nitrite,UA POCT: NEGATIVE
PH,UA POCT: 5 (ref 5–8)
Specific gravity,UA POCT: 1.02 (ref 1.002–1.030)

## 2023-05-03 MED ORDER — TADALAFIL 5 MG PO TABS *I*
5.0000 mg | ORAL_TABLET | Freq: Every day | ORAL | 3 refills | Status: AC
Start: 2023-05-03 — End: ?

## 2023-05-03 NOTE — Progress Notes (Signed)
[facility] Patient Visit    Date: 05/03/2023  Timothy Cross    Feb 19, 1961  62 y.o.      Referring provider : Dan Humphreys    Chief Complaint: ED     Referral from Dr. Lisabeth Pick.     HPI:      62 year old male history of both Gleason 3 + 4 and Gleason 3 + 3 prostate cancer that was clinical stage cT1c and a PSA at 4.18 at the time of diagnosis. He underwent prostate seed implant brachytherapy 5-1/2 years ago with 125 iodine seeds pm 10/2017.      With his ED, sometimes it has a mind of its own.     He can generally get a erection but it does not last.     He is in a long distance relationship.     He is currently taking Cialis. And if the timing it right it does help and it makes a difference but it is not optimal to meet his sexual goals.       Medications:   Current Outpatient Medications:     tadalafil (CIALIS) 20 MG tablet, take 1 tablet (20 MILLIGRAM total) by mouth daily as needed for erectile dysfunction take at least 30 minutes prior to sexual activity., Disp: 90 tablet, Rfl: 3    amoxicillin (AMOXIL) 500 MG capsule, Take 4 capsules 1 hour prior to dental procedure, Disp: 20 capsule, Rfl: 2  Allergies: Environmental allergies and Poison ivy extract    Review of Systems :   Constitutional: Negative.  HEENT: Negative  Respiratory: Negative  Cardiac: Negative  GI: Negative for constipation.  GU: See above.  Musculoskeletal: Negative for flank pain  Neurological: Negative  Hematological: Negative  Behavorial: Negative  Skin: Negative  Endocrine:Negative  Vascular:Negative    Past Medical History:   Diagnosis Date    Anxiety     Arthritis     hip    Cancer     Colon polyp     Elevated PSA oct. 2018    Inguinal hernia bilateral, non-recurrent 03/06/2016    Left cervical radiculopathy 12/29/2019    Osteoarthritis of right hip 08/25/2019    Rib fracture 2023    left side    S/P left inguinal hernia repair 02/11/2019    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     Past Surgical History:   Procedure Laterality Date     INGUINAL HERNIA REPAIR      JOINT REPLACEMENT  09/2019    Rt hip    KNEE SURGERY Bilateral     arthroscopy ACL reconstruction    PR ARTHRP ACETBLR/PROX FEM PROSTC AGRFT/ALGRFT Right 09/24/2019    Procedure: RIGHT ARTHROPLASTY, HIP, TOTAL ;  Surgeon: Laurance Flatten, MD;  Location: [facility] MAIN OR;  Service: Orthopedics    PR ARTHRP KNE CONDYLE&PLATU MEDIAL&LAT COMPARTMENTS Left 01/07/2023    Procedure: LEFT ARTHROPLASTY, KNEE, TOTAL;  Surgeon: Laurance Flatten, MD;  Location: [facility]IN [facility];  Service: Orthopedics    PR LAPAROSCOPY SURG RPR INITIAL INGUINAL HERNIA Bilateral 10/29/2016    Procedure:  LAPAROSCOPIC BILATERAL  INGUINAL HERNIA REPAIR with mesh;  Surgeon: Gwenyth Allegra, MD;  Location: [facility] MAIN OR;  Service: General    PR LAPAROSCOPY SURG RPR INITIAL INGUINAL HERNIA Left 01/29/2019    Procedure: LAPAROSCOPIC REPAIR WITH MESH, HERNIA, LEFT INGUINAL, ROBOT-ASSISTED;  Surgeon: Gwenyth Allegra, MD;  Location: [facility] MAIN OR;  Service: General    PR LAPS REPAIR HERNIA EXCEPT INCAL/INGUN REDUCIBLE N/A  10/29/2016    Procedure:  UMBILICAL HERNIA REPAIR;  Surgeon: Gwenyth Allegra, MD;  Location: [facility] MAIN OR;  Service: General    PROSTATE SURGERY  2019    UMBILICAL HERNIA REPAIR      VASECTOMY      Surgery Of Male Genitalia Vasectomy Conversion Data      family history includes Cancer in his brother, maternal grandfather, and mother; Conversion in an other family member; Skin Cancer in his father; Thyroid disease in his brother.   reports that he has never smoked. He has never used smokeless tobacco. He reports current alcohol use of about 5.0 standard drinks of alcohol per week. He reports that he does not use drugs.    Physical Exam:   Vitals:    05/03/23 0923   Weight: 70.3 kg (155 lb)   Height: 1.829 m (6')     General : Normal  Neurologic: Oriented to person,place, time  Psychiatric : Normal mood and affect  Skin : Normal color, turgor, texture, hydration  Respiratory : normal respiratory effort, no rales, ronchi, or  wheezes          Impression : history of both Gleason 3 + 4 and Gleason 3 + 3 prostate cancer that was clinical stage cT1c and a PSA at 4.18 at the time of diagnosis. He underwent prostate seed implant brachytherapy 10/2017  with ED.     PRN Cialis has not been optimal.     Plan :     Discussed various treatment options for ED, including proper use of oral medications, penile injection therapy, urethral suppositories, vacuum erection device, and inflatable penile implant.      He is unsure of his options at this time.     Will trial daily cialis in addition to his prn per patient request. Order sent.       RV in 2-4 months.       Edwena Blow. Broadus John, MD, MSc   Assistant Professor of Urology   Department of Urology       Answers submitted by the patient for this visit:  Urology Sexual Problem Questionnaire (Submitted on 05/02/2023)  Chief Complaint: Sexual dysfunction  Progression since Onset?  : gradually worsening  Have you seen a urologist?  : Yes, for this condition  Have you had any prior abdominal or pelvic surgeries?: prior robotic  Have you ever needed a catheter before?: yes  Describe the blood in your urine (if you have any).: none  Are you on anticoagulation/blood thinners?: no  Smoking history?  : no  What is your goal for your visit? : identify improvements for sexual function  How would you rate your pain level? (0 = no pain; 10 = worst pain): 0/10  What treatments have you tried for this problem?: tadalafil  Was there any improvement after the treatment you tried?  : mild

## 2023-05-06 MED ORDER — TADALAFIL 20 MG PO TABS *I*
20.0000 mg | ORAL_TABLET | ORAL | 3 refills | Status: AC | PRN
Start: 2023-05-06 — End: ?

## 2023-05-06 NOTE — Telephone Encounter (Signed)
Prescription was last filled Cialis 20 mg (#90 3 refills)August 03 2022    Patient was last seen 03/04/2023    Plan from last visit:Will trial daily cialis in addition to his prn per patient request. Order sent.      Next appointment date: 09/03/23  Please review and sign if appropriate.

## 2023-06-24 ENCOUNTER — Other Ambulatory Visit: Payer: Self-pay

## 2023-06-25 ENCOUNTER — Encounter: Payer: Self-pay | Admitting: Orthopedic Surgery

## 2023-06-25 ENCOUNTER — Ambulatory Visit: Payer: No Typology Code available for payment source | Attending: Orthopedic Surgery

## 2023-06-25 VITALS — BP 128/72 | HR 52 | Ht 72.44 in | Wt 154.7 lb

## 2023-06-25 DIAGNOSIS — Z96652 Presence of left artificial knee joint: Secondary | ICD-10-CM

## 2023-06-25 NOTE — Progress Notes (Signed)
 ADULT POST-OP TKA    HPI: Timothy Cross presents today for a 6 month(s) post-op visit following a left total knee arthroplasty performed on  01/07/23 . Patient states pain is 0/10 in severity. Pain is well controlled without medications. He is able to play pickleball without discomfort. The patient is performing home exercises and exercising. He wishes he was focusing more on knee specific strengthening and conditioning.  Range of motion has been progressively improving.  He also reports having clicking in the knee, especially when moving from flexion to extension. Denies fever, chills, SOB. Denies lower extremity numbness or tingling. Denies drainage or redness from surgical incision. States an overall improvement with knee pain symptoms from perioperative baseline.    EXAM: Well-developed, well-nourished, appropriately dressed, healthy appearing 63 y.o.-year-old male in no apparent distress. Mood and affect appropriate. A+O x3. Pt seated comfortably in chair. Able to rise independently from seated position.     Focused musculoskeletal exam was performed today and demonstrates:  No antalgic gait without an observable limp   Left knee ROM 0-115   Knee joint is grossly stable   Surgical incision is healing well, no drainage, no erythema, no swelling, well approximated. There is not significant effusion   Calf is soft and nontender, distal extremity CMS intact  5/5 DF/PF, ITLT tibial and peroneal nerve, 2+ DP/PT pulse    Radiographs were not obtained at today's visit. Previous x-rays were reviewed, showing a total knee arthroplasty with normal alignment.    ASSESSMENT: s/p left TKA 01/07/23    PLAN:  The patient's x-ray findings and underlying diagnosis reviewed.  Patient plans to continue to participate in  the home exercise program. Importance of following dental antibiotic prophylaxis precautions reviewed. Acceptable low-impact exercise activities were reviewed. All ambulatory activities to be performed within the  confines of comfort. Additional questions were invited and answered.    Follow up visit at 12 month(s) postop or sooner when necessary for reexamination of the left knee.    Orders Next Visit: 3 views left knee    Hollie Beach, NP  Division of Adult Reconstruction  Department of Orthopaedic Surgery  [facility] [place]    Answers submitted by the patient for this visit:  Left knee (Submitted on 06/24/2023)  Chief Complaint: LEFT KNEE PAIN HPI  What is your goals for today's visit?: Surgical followup  Was this the result of an injury?: No  What is your pain level?: none  Please describe the quality of your pain: : fullness, numbness, stiffness  What diagnostic workup have you had for this condition?: no prior workup  What treatments have you tried for this condition?: ice  Is this a work related condition? : No  Questionnaire about: LEFT KNEE PAIN HPI (Submitted on 06/24/2023)  Chief Complaint: LEFT KNEE PAIN HPI

## 2023-08-02 ENCOUNTER — Other Ambulatory Visit: Payer: Self-pay | Admitting: Orthopedic Surgery

## 2023-08-05 MED ORDER — AMOXICILLIN 500 MG PO CAPS *I*
ORAL_CAPSULE | ORAL | 2 refills | Status: AC
Start: 2023-08-05 — End: ?

## 2023-08-12 DIAGNOSIS — N139 Obstructive and reflux uropathy, unspecified: Secondary | ICD-10-CM | POA: Diagnosis not present

## 2023-08-19 DIAGNOSIS — N320 Bladder-neck obstruction: Secondary | ICD-10-CM | POA: Diagnosis not present

## 2023-08-19 DIAGNOSIS — R339 Retention of urine, unspecified: Secondary | ICD-10-CM | POA: Diagnosis not present

## 2023-09-03 ENCOUNTER — Ambulatory Visit: Payer: No Typology Code available for payment source | Admitting: Urology

## 2023-09-06 ENCOUNTER — Ambulatory Visit: Payer: No Typology Code available for payment source | Admitting: Urology

## 2023-09-06 ENCOUNTER — Ambulatory Visit: Payer: No Typology Code available for payment source

## 2023-10-01 IMAGING — CT CT CARDIAC CORONARY ARTERY CALCIUM SCORE
3 series · 14 of 20 positions shown, 16 images · non-contrast
Comparison: None Available.

CLINICAL DATA: Family history, elevated cholesterol

EXAM:
CT CARDIAC CORONARY ARTERY CALCIUM SCORE
TECHNIQUE: Non-contrast imaging through the heart was performed using
prospective ECG gating. Image post processing was performed on an
independent workstation, allowing for quantitative analysis of the
heart and coronary arteries. Note that this exam targets the heart
and the chest was not imaged in its entirety.

[Series 2: calcium scoring 2.00 qr36 bestdiast 67% hrt calciu · axial · 0.44mm/px · z∈[+1441,+1531]mm · 4 of 77 slices shown]
[im 16/77  vessel]
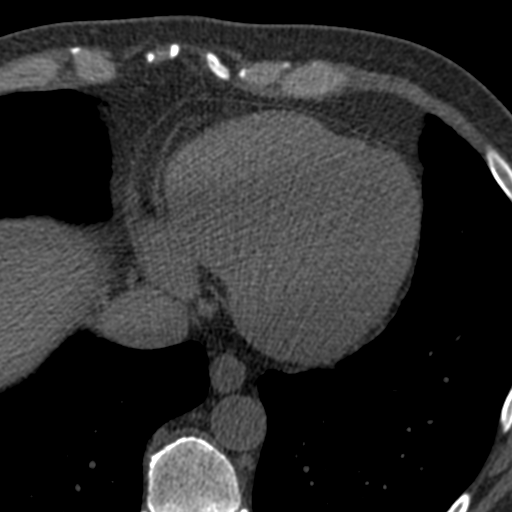
[im 31/77  vessel]
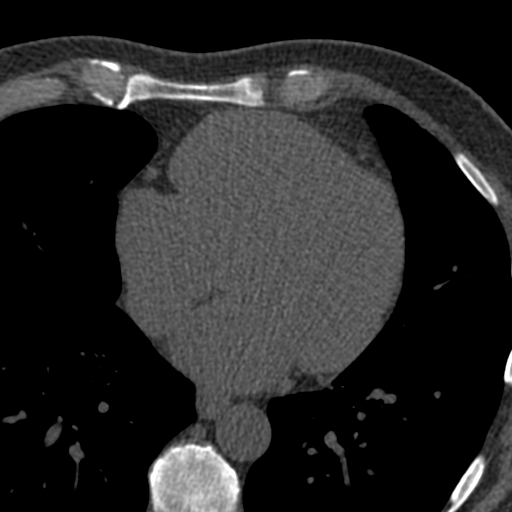
[im 46/77  vessel]
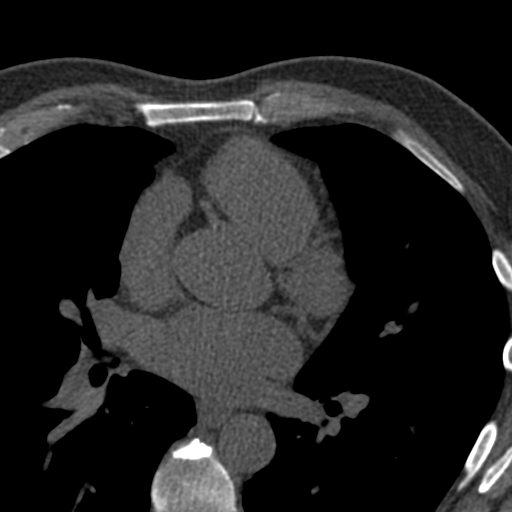
[im 61/77  vessel]
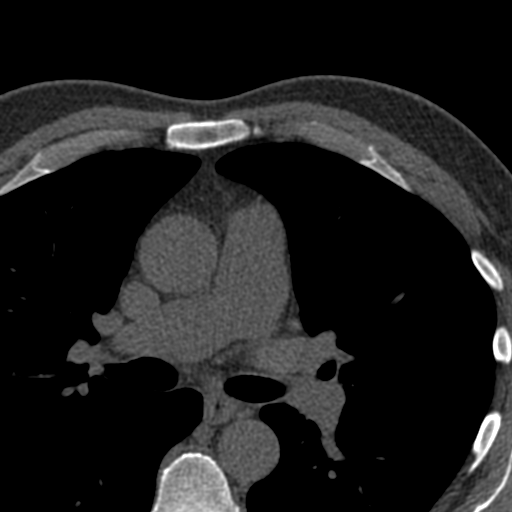

[Series 3: calcium scoring 2.00 br40 bestdiast 67% axial · axial · 0.65mm/px · z∈[+1431,+1535]mm · 5 of 80 slices shown, 7 images]
[im 14/80  vessel]
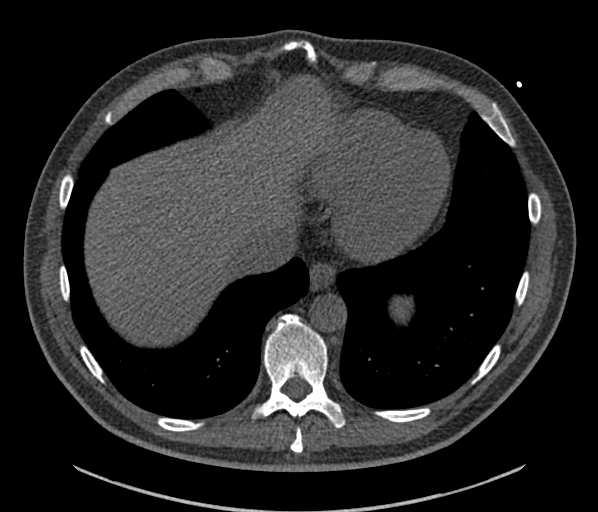
[im 14/80  lung]
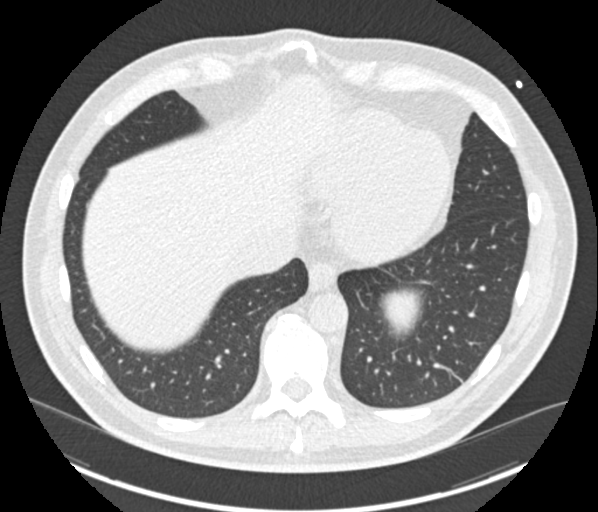
[im 27/80  vessel]
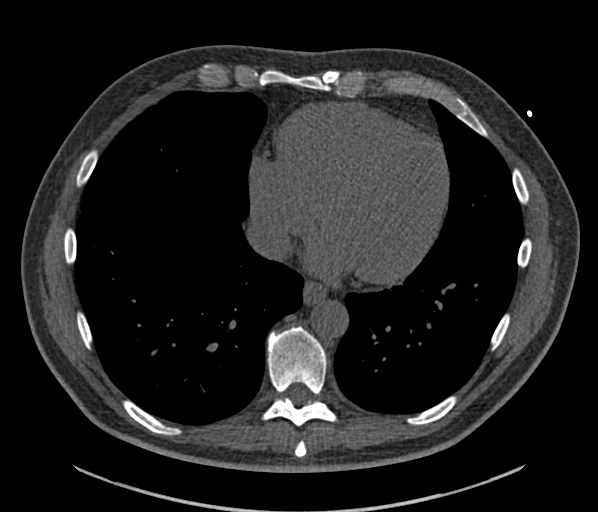
[im 40/80  vessel]
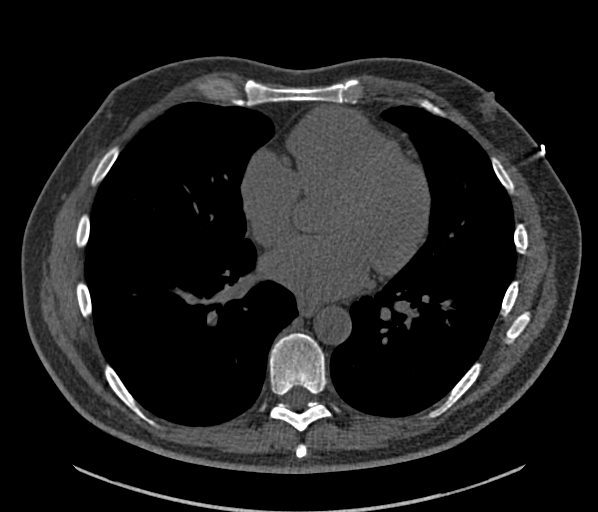
[im 53/80  vessel]
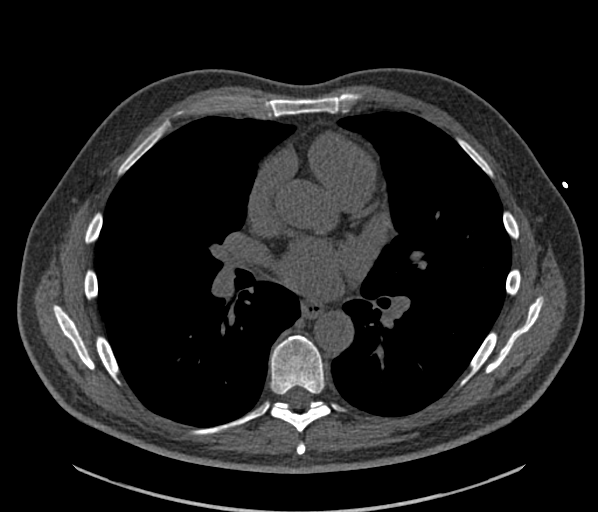
[im 66/80  vessel]
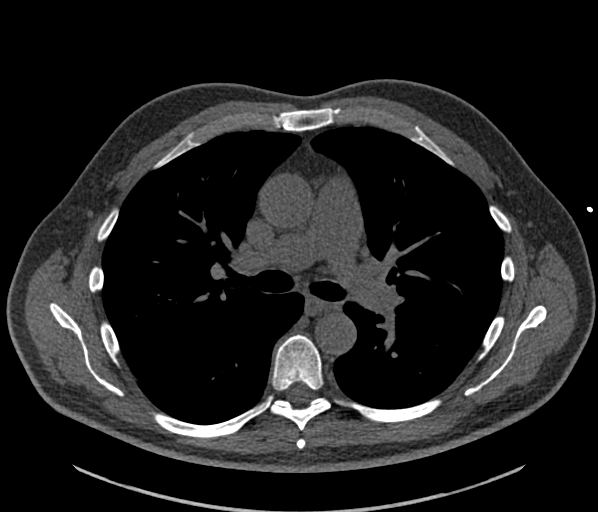
[im 66/80  lung]
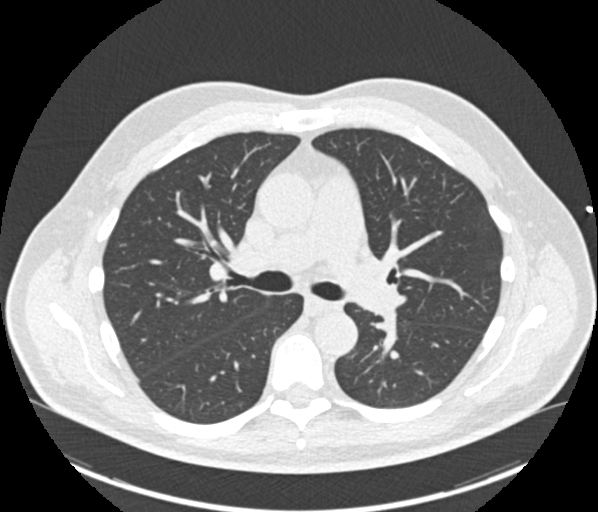

[Series 9: calcium scoring 2.00 br60 bestdiast 67% lungs · axial · 0.65mm/px · z∈[+1431,+1535]mm · 5 of 80 slices shown]
[im 14/80  vessel]
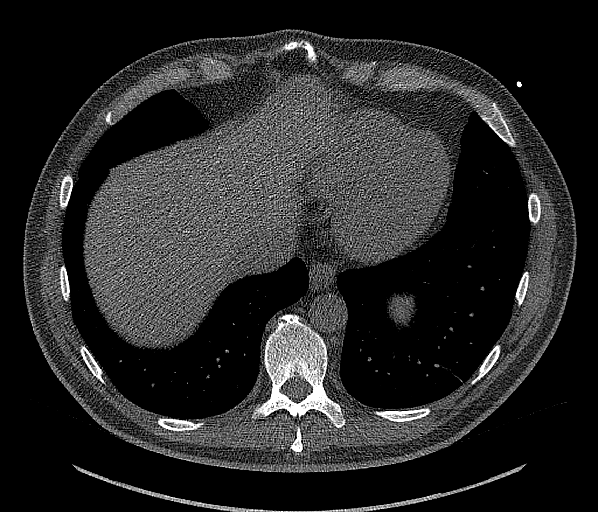
[im 27/80  vessel]
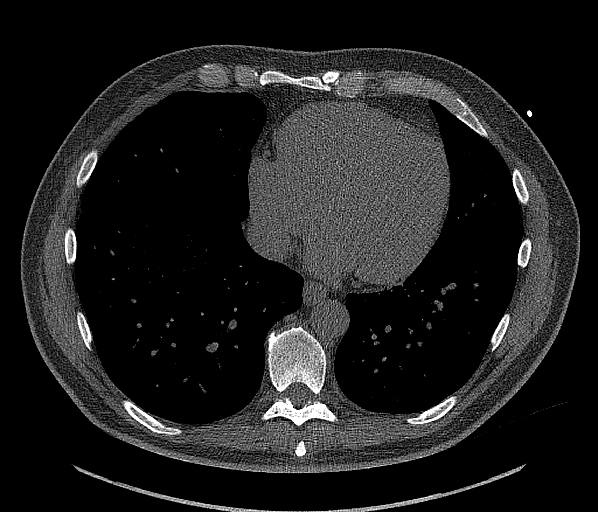
[im 40/80  vessel]
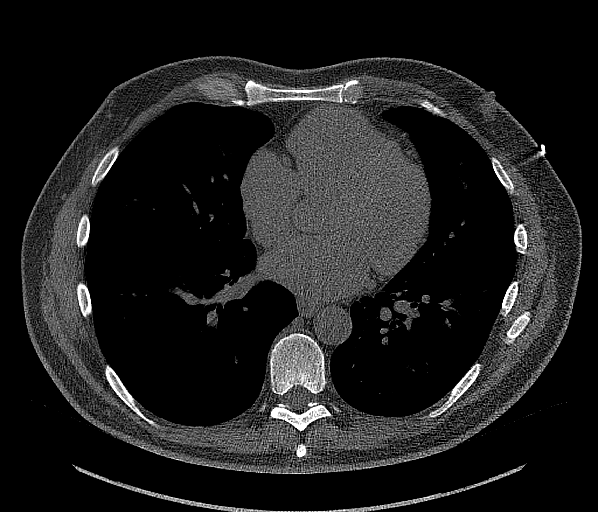
[im 53/80  vessel]
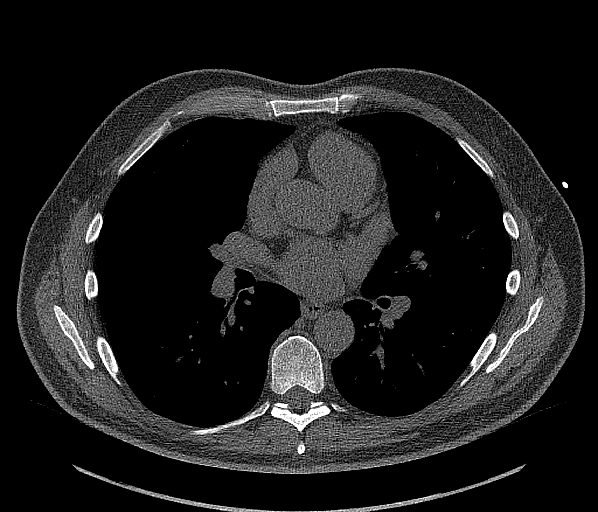
[im 66/80  vessel]
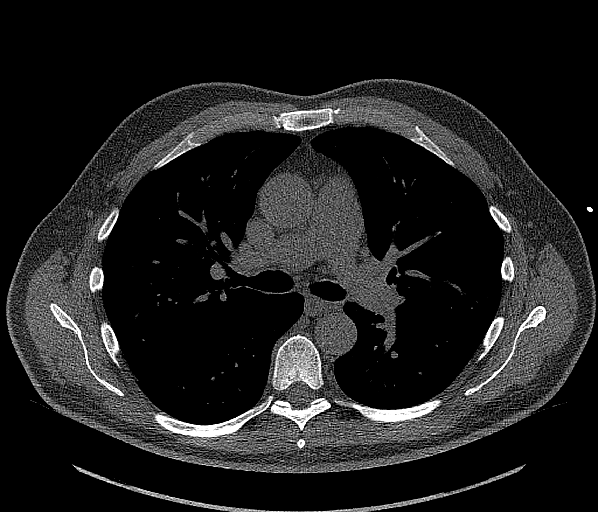

[14 of 20 positions shown; findings below may reference images not displayed]

FINDINGS: CORONARY CALCIUM SCORES:

Left Main: 0

LAD: 0

LCx: 4

RCA: 0

Total Agatston Score: 4

[HOSPITAL] percentile: 34

AORTA MEASUREMENTS:

Ascending Aorta: 33 mm

Descending Aorta: 25 mm

OTHER FINDINGS:

Heart is normal size. Aorta normal caliber. No adenopathy. No
confluent opacities or effusions. No acute findings in the upper
abdomen. Chest wall soft tissues are unremarkable. No acute bony
abnormality.
IMPRESSION: Total Agatston score: 4

[HOSPITAL] percentile: 34

No acute or significant extracardiac abnormality.

## 2023-10-07 DIAGNOSIS — N320 Bladder-neck obstruction: Secondary | ICD-10-CM | POA: Diagnosis not present

## 2023-10-07 DIAGNOSIS — R339 Retention of urine, unspecified: Secondary | ICD-10-CM | POA: Diagnosis not present

## 2023-10-21 DIAGNOSIS — E559 Vitamin D deficiency, unspecified: Secondary | ICD-10-CM | POA: Diagnosis not present

## 2023-10-21 DIAGNOSIS — Z0000 Encounter for general adult medical examination without abnormal findings: Secondary | ICD-10-CM | POA: Diagnosis not present

## 2023-11-07 DIAGNOSIS — R338 Other retention of urine: Secondary | ICD-10-CM | POA: Diagnosis not present

## 2023-11-07 DIAGNOSIS — R972 Elevated prostate specific antigen [PSA]: Secondary | ICD-10-CM | POA: Diagnosis not present

## 2023-12-23 ENCOUNTER — Other Ambulatory Visit: Payer: Self-pay

## 2023-12-24 ENCOUNTER — Encounter: Payer: Self-pay | Admitting: Orthopedic Surgery

## 2023-12-24 ENCOUNTER — Ambulatory Visit: Payer: No Typology Code available for payment source | Attending: Orthopedic Surgery | Admitting: Orthopedic Surgery

## 2023-12-24 ENCOUNTER — Ambulatory Visit
Admission: RE | Admit: 2023-12-24 | Discharge: 2023-12-24 | Disposition: A | Discharge status: Home / Self Care | Source: Ambulatory Visit

## 2023-12-24 VITALS — BP 121/70 | HR 53 | Ht 72.0 in | Wt 154.0 lb

## 2023-12-24 DIAGNOSIS — Z96652 Presence of left artificial knee joint: Secondary | ICD-10-CM

## 2023-12-24 DIAGNOSIS — Z471 Aftercare following joint replacement surgery: Secondary | ICD-10-CM

## 2023-12-24 NOTE — Progress Notes (Signed)
 Subjective: Timothy Cross is here 1 year s/p left TKA. Overall the patient is doing well, he denies specific complaints.     ROS: No chest pain, SOB, fever, chills, skin erythema, all remaining systems negative.    Objective:  Constitutional: NAD, comfortable  Psychiatric: Normal affect  Musculoskeletal:  Soft tissue: Well healed surgical incision   Palpation: Calor: Mild  Gait: unassisted   ROM: 0-12  Stability: knee stable in the coronal and sagittal plane at 30 & 90 degrees of flexion  Neurocirculatory: compartments soft, foot warm and perfused     Radiographs: 3 views left knee reviewed and demonstrate satisfactory & stable component position, neutral alignment.     Impression:  1. S/P Left TKA - 1 year  2.  Right knee OA - severe s/p ACL reconstruction; flexion contracture.    Plan:  Doing well overall, may pursue right TKA on elective basis. Discussed challenges associated TKA s/p ACL

## 2023-12-31 DIAGNOSIS — L720 Epidermal cyst: Secondary | ICD-10-CM | POA: Diagnosis not present

## 2024-01-03 DIAGNOSIS — R339 Retention of urine, unspecified: Secondary | ICD-10-CM | POA: Diagnosis not present

## 2024-01-03 DIAGNOSIS — N320 Bladder-neck obstruction: Secondary | ICD-10-CM | POA: Diagnosis not present

## 2024-02-07 DIAGNOSIS — M25511 Pain in right shoulder: Secondary | ICD-10-CM | POA: Diagnosis not present

## 2024-02-07 DIAGNOSIS — S2231XA Fracture of one rib, right side, initial encounter for closed fracture: Secondary | ICD-10-CM | POA: Diagnosis not present

## 2024-02-07 DIAGNOSIS — S2239XA Fracture of one rib, unspecified side, initial encounter for closed fracture: Secondary | ICD-10-CM | POA: Diagnosis not present

## 2024-02-07 DIAGNOSIS — R0781 Pleurodynia: Secondary | ICD-10-CM | POA: Diagnosis not present

## 2024-02-11 DIAGNOSIS — M25512 Pain in left shoulder: Secondary | ICD-10-CM | POA: Diagnosis not present

## 2024-02-14 ENCOUNTER — Telehealth: Payer: Self-pay | Admitting: Primary Care

## 2024-02-14 DIAGNOSIS — K219 Gastro-esophageal reflux disease without esophagitis: Secondary | ICD-10-CM

## 2024-02-14 DIAGNOSIS — E782 Mixed hyperlipidemia: Secondary | ICD-10-CM

## 2024-02-14 DIAGNOSIS — Z0000 Encounter for general adult medical examination without abnormal findings: Secondary | ICD-10-CM

## 2024-02-14 DIAGNOSIS — Z8546 Personal history of malignant neoplasm of prostate: Secondary | ICD-10-CM

## 2024-02-14 NOTE — Telephone Encounter (Signed)
 Please sign pending lab orders for patient's upcoming physical on 9/12

## 2024-02-21 ENCOUNTER — Encounter: Payer: No Typology Code available for payment source | Admitting: Primary Care

## 2024-02-21 DIAGNOSIS — M25512 Pain in left shoulder: Secondary | ICD-10-CM | POA: Diagnosis not present

## 2024-02-29 ENCOUNTER — Other Ambulatory Visit: Payer: Self-pay | Admitting: Urology

## 2024-02-29 DIAGNOSIS — R319 Hematuria, unspecified: Secondary | ICD-10-CM

## 2024-02-29 DIAGNOSIS — C61 Malignant neoplasm of prostate: Secondary | ICD-10-CM

## 2024-02-29 NOTE — Progress Notes (Signed)
 I ordered labs to be done before 9/23 RTC; Testosterone , PSA and BMP

## 2024-03-02 ENCOUNTER — Telehealth: Payer: Self-pay | Admitting: Urology

## 2024-03-02 NOTE — Telephone Encounter (Signed)
 Sent the patient a message trying to remind them to get the lab work done prior to their visit next week.Per the office visit on 03/04/23:Instructions: Follow up in about 1 year (around 03/03/2024) for labs before PVR bladder scan; to see EM but see dr. Olden or [facility] or [facility] for ED soon. Brutus Dallas HERO, MD  Nema Oatley, JazmariI ordered labs and please remember PVR bladder scan when he comes for 9/23 RTC visit. Please remind him to get the blood tests at a [facility] on Monday

## 2024-03-04 ENCOUNTER — Other Ambulatory Visit
Admission: RE | Admit: 2024-03-04 | Discharge: 2024-03-04 | Disposition: A | Discharge status: Home / Self Care | Source: Ambulatory Visit | Attending: Primary Care | Admitting: Primary Care

## 2024-03-04 DIAGNOSIS — K219 Gastro-esophageal reflux disease without esophagitis: Secondary | ICD-10-CM | POA: Insufficient documentation

## 2024-03-04 DIAGNOSIS — E782 Mixed hyperlipidemia: Secondary | ICD-10-CM | POA: Insufficient documentation

## 2024-03-04 DIAGNOSIS — C61 Malignant neoplasm of prostate: Secondary | ICD-10-CM | POA: Insufficient documentation

## 2024-03-04 DIAGNOSIS — Z8546 Personal history of malignant neoplasm of prostate: Secondary | ICD-10-CM | POA: Insufficient documentation

## 2024-03-04 DIAGNOSIS — R319 Hematuria, unspecified: Secondary | ICD-10-CM | POA: Insufficient documentation

## 2024-03-04 LAB — COMPREHENSIVE METABOLIC PANEL
ALT: 11 U/L (ref 0–50)
AST: 18 U/L (ref 0–50)
Albumin: 4.4 g/dL (ref 3.5–5.2)
Alk Phos: 56 U/L (ref 40–130)
Anion Gap: 10 (ref 7–16)
Bilirubin,Total: 0.5 mg/dL (ref 0.0–1.2)
CO2: 27 mmol/L (ref 20–28)
Calcium: 9.5 mg/dL (ref 8.6–10.2)
Chloride: 106 mmol/L (ref 96–108)
Creatinine: 1.1 mg/dL (ref 0.67–1.17)
Glucose: 90 mg/dL (ref 60–99)
Lab: 18 mg/dL (ref 6–20)
Potassium: 4.7 mmol/L (ref 3.3–5.1)
Sodium: 143 mmol/L (ref 133–145)
Total Protein: 6.8 g/dL (ref 6.3–7.7)
eGFR BY CREAT: 75

## 2024-03-04 LAB — CBC AND DIFFERENTIAL
Baso # K/uL: 0 THOU/uL (ref 0.0–0.2)
Eos # K/uL: 0.3 THOU/uL (ref 0.0–0.5)
Hematocrit: 45 % (ref 37–52)
Hemoglobin: 13.7 g/dL (ref 12.0–17.0)
IMM Granulocytes #: 0 THOU/uL (ref 0–0)
IMM Granulocytes: 0.2 %
Lymph # K/uL: 1.4 THOU/uL (ref 1.0–5.0)
MCV: 92 fL (ref 75–100)
Mono # K/uL: 0.5 THOU/uL (ref 0.1–1.0)
Neut # K/uL: 3 THOU/uL (ref 1.5–6.5)
Nucl RBC # K/uL: 0 THOU/uL
Nucl RBC %: 0 /100{WBCs} (ref 0.0–0.2)
Platelets: 175 THOU/uL (ref 150–450)
RBC: 4.9 MIL/uL (ref 4.0–6.0)
RDW: 12.8 % (ref 0.0–15.0)
Seg Neut %: 56.4 %
WBC: 5.2 THOU/uL (ref 3.5–11.0)

## 2024-03-04 LAB — LIPID PANEL
Chol/HDL Ratio: 2.5
Cholesterol: 192 mg/dL
HDL: 77 mg/dL — ABNORMAL HIGH (ref 40–60)
LDL Calculated: 103 mg/dL
Non HDL Cholesterol: 115 mg/dL
Triglycerides: 63 mg/dL

## 2024-03-04 LAB — BASIC METABOLIC PANEL
Anion Gap: 10 (ref 7–16)
CO2: 27 mmol/L (ref 20–28)
Calcium: 9.5 mg/dL (ref 8.6–10.2)
Chloride: 106 mmol/L (ref 96–108)
Creatinine: 1.1 mg/dL (ref 0.67–1.17)
Glucose: 90 mg/dL (ref 60–99)
Lab: 18 mg/dL (ref 6–20)
Potassium: 4.7 mmol/L (ref 3.3–5.1)
Sodium: 143 mmol/L (ref 133–145)
eGFR BY CREAT: 75

## 2024-03-04 LAB — PSA (EFF.4-2010)
PSA (eff. 4-2010): 0.02 ng/mL (ref 0.00–4.00)
PSA (eff. 4-2010): 0.02 ng/mL (ref 0.00–4.00)

## 2024-03-04 LAB — VITAMIN D: 25-OH Vit Total: 34 ng/mL (ref 30–60)

## 2024-03-04 LAB — TSH: TSH: 2.46 u[IU]/mL (ref 0.27–4.20)

## 2024-03-04 LAB — TESTOSTERONE BY IMMUNOASSAY (ADULT MALES OR INDIVIDUALS ON TESTOSTERONE HORMONE THERAPY): Testosterone: 828 ng/dL — ABNORMAL HIGH (ref 193–740)

## 2024-03-05 ENCOUNTER — Other Ambulatory Visit: Payer: Self-pay

## 2024-03-05 LAB — HEMOGLOBIN A1C: Hemoglobin A1C: 5.2 % (ref <=5.6)

## 2024-03-06 ENCOUNTER — Ambulatory Visit: Payer: Self-pay | Attending: Primary Care | Admitting: Medical

## 2024-03-06 ENCOUNTER — Other Ambulatory Visit: Payer: Self-pay | Admitting: Medical

## 2024-03-06 ENCOUNTER — Other Ambulatory Visit: Payer: Self-pay | Admitting: Urology

## 2024-03-06 ENCOUNTER — Ambulatory Visit
Admission: RE | Admit: 2024-03-06 | Discharge: 2024-03-06 | Disposition: A | Discharge status: Home / Self Care | Source: Ambulatory Visit

## 2024-03-06 ENCOUNTER — Encounter: Payer: Self-pay | Admitting: Medical

## 2024-03-06 VITALS — BP 122/74 | HR 46 | Temp 97.3°F | Ht 72.0 in | Wt 155.0 lb

## 2024-03-06 DIAGNOSIS — Z23 Encounter for immunization: Secondary | ICD-10-CM

## 2024-03-06 DIAGNOSIS — R3989 Other symptoms and signs involving the genitourinary system: Secondary | ICD-10-CM

## 2024-03-06 DIAGNOSIS — M545 Low back pain, unspecified: Secondary | ICD-10-CM

## 2024-03-06 DIAGNOSIS — C4491 Basal cell carcinoma of skin, unspecified: Secondary | ICD-10-CM

## 2024-03-06 DIAGNOSIS — M47817 Spondylosis without myelopathy or radiculopathy, lumbosacral region: Secondary | ICD-10-CM

## 2024-03-06 DIAGNOSIS — E785 Hyperlipidemia, unspecified: Secondary | ICD-10-CM

## 2024-03-06 DIAGNOSIS — Z0000 Encounter for general adult medical examination without abnormal findings: Secondary | ICD-10-CM

## 2024-03-06 DIAGNOSIS — Z8546 Personal history of malignant neoplasm of prostate: Secondary | ICD-10-CM

## 2024-03-06 DIAGNOSIS — M47816 Spondylosis without myelopathy or radiculopathy, lumbar region: Secondary | ICD-10-CM

## 2024-03-06 DIAGNOSIS — M4316 Spondylolisthesis, lumbar region: Secondary | ICD-10-CM

## 2024-03-06 LAB — MEASLES IGG AB: Measles IgG: 1.9

## 2024-03-06 LAB — HEPATITIS B SURFACE ANTIBODY
HBV S Ab Quant: 0.42 m[IU]/mL
HBV S Ab: NEGATIVE

## 2024-03-06 MED ORDER — ATORVASTATIN CALCIUM 10 MG PO TABS *I*
10.0000 mg | ORAL_TABLET | Freq: Every day | ORAL | 3 refills | Status: DC
Start: 2024-03-06 — End: 2024-03-10

## 2024-03-06 NOTE — Progress Notes (Signed)
 Subjective: Patient is here today for an annual physical Patient ID: Timothy Cross is 64 y.o. HPI: Patient is here today for an annual physical.He reports he is doing generally well at this time.  He recently saw dermatology and had a lesion removed on his face that came back as basal cell carcinoma.  He will be undergoing Mohs procedure in the futureHe reports over the last year he has been noticing some back pain.  He reports that this started as mid back pain that would mostly occur with walking.  Over the last few months he has been having some lower back pain that is more present after sitting or standing for long periods of time.  He feels as if his back is tightened and usually after some stretching is able to improve.  He denies any weakness numbness or tingling in his bilateral lower extremities.  Denies saddle anesthesia and  new bowel/bladder incontinenceDiet: typically follows a lighter diet. Exercise:  more handyman work, walking, occasional bike ridingReview of Systems Constitutional: Feels generally well; no fevers, night sweats or unintentional weight loss.Eyes: No visual changes, no eye pain, ENT: No hearing difficulties, no ear pain, no sore throat,CV: No chest pain, palpitations, orthopnea, or leg swellingRespiratory: No cough, wheezing or dyspneaGI: No nausea/vomiting, abdominal pain, or change in bowel habitsGU: No dysuria, no change in urinary function.  MS: No joint pain or swelling, no axial problems Skin: No rashes or new/changing skin lesionsNeuro: No headaches; no problems with speech or cognition or memory, no arm or leg weakness, no numbness or tinglingPsych: No problems with depression or anxiety Endocrine: No polyuria or polydipsia, no heat or cold intoleranceHeme/lymph: No easy bleeding or bruising; no swollen glandsAll/Immun: No allergic reactionsMedications: Current Outpatient Medications Medication Sig   omeprazole 20 mg capsule Take 1 capsule (20 mg total) by mouth daily (before breakfast).  atorvastatin  (LIPITOR) 10 mg tablet Take 1 tablet (10 mg total) by mouth daily.  amoxicillin  (AMOXIL ) 500 mg capsule for Joint Prosthesis. Take 4 capsules 1 hour prior to dental procedure  tadalafil  (CIALIS ) 20 MG tablet Take 1 tablet (20 mg total) by mouth as needed for Erectile Dysfunction. Take at least 30 minutes prior to sexual activity.  tadalafil  (CIALIS ) 5 MG tablet Take 1 tablet (5 mg total) by mouth daily for Erectile Dysfunction. No current facility-administered medications for this visit. Medication list reconciled this visitAllergies: Allergies[1]Immunizations: Most Recent Immunizations Administered Date(s) Administered  Covid-19 mRNA vaccine (PFIZER) IM 30 mcg/0.3mL 05/17/2020  Hep B/HiB (Comvax) 04/17/2013  Hepatitis A Adult 02/24/2013  IPV 02/24/2013  Influenza Adult(66yr and up) 02/28/2012  Influenza Cell-based Trivalent PF Vaccine, 80mo-64y 03/06/2024  Influenza Inj Quad Historical(aka FLU,unspecified) 03/05/2023  Influenza Quad 0.54mL prefilled syringe/single dose vial (FluLaval,Fluzone,Afluria,Fluarix)Historical 04/19/2022  Influenza Quad Cell-Based prefilled syringe (Flucelvax) 70mo+ Historical 04/08/2017  Influenza, split virus, trivalent, PF 69m+ 02/24/2013  Tdap 03/06/2024  Typhoid Inactivated 11/24/2012  Zoster(Shingrix ) 04/10/2019 Past Medical History: Past Medical History[2] Past Surgical History[3]  Family History: Family History[4] Social History  Social History Tobacco Use  Smoking status: Never  Smokeless tobacco: Never Substance Use Topics  Alcohol use: Yes   Alcohol/week: 5.0 standard drinks of alcohol   Types: 2 Cans of beer, 3 Drinks containing 0.5 oz of alcohol per week   Comment: 3-4 drinks on the weekend   Objective: Physical ExamVital Signs. BP 122/74   Pulse (!) 46   Temp 36.3 C (97.3 F)  (Temporal)   Ht 1.829 m (6')   Wt 70.3 kg (155 lb)  SpO2 100%   BMI 21.02 kg/m General: Patient is pleasant and in no acute distressHead: Normocephalic, Atraumatic.  Eyes: Lids appear normal, EOM intact. PERRLENT: Inspection reveals normal appearing ears and nose.  Otoscopic exam shows tympanic membranes shiny and normal.  Hearing is intact to normal conversational tones.  Nostrils patent.  Mucous membranes are moist. pharynx without redness or exudate.  Neck: Supple, no masses.Resp: Lungs clear to auscultation bilaterally without rales or wheezing.  CV: Regular rate and rhythm  No murmur/rub/gallop. No evidence of lower extremity edema.  GI: BS +4 quadrants. No tenderness to palpation in all 4 quadrants. MSK:  Normal gait.  No swelling or deformity of joints, muscles without atrophy.  Strength 5/5 in upper and lower extremities bilaterallyNeuro:  Cranial nerves II-XII grossly intact. No focal deficits of motor or sensory system.  DTR's symmetric. Skin: Normal texture, no rash or suspicious lesions.  Psych: Alert and Oriented x3 .  Mood/affect normal.   Assessment/ PlanHistory of prostate cancer- Continue to follow routinely with urology. Follow up scheduled 03/10/35 Hyperlipidemia-ASCVD risk score 7.5 percent.  We discussed benefit of starting atorvastatin  10 mg daily.  Sent to pharmacy.  Repeat lipid panel ordered in 2 to 3 monthsLow back pain-Likely musculoskeletal.  Given previous history of prostate cancer will order x-ray for further evaluation.  Discussed formal PT referral but patient would like to try exercises at home first.  He will reach out if symptoms are not improvingImmunization due- Flu and Tdap given today in office.  Patient tolerated well.  We will try to add on titers from recent blood draw for hepatitis B and measlesPreventive health care: Patient is here today for an annual physical. Reviewed drugs, ETOH, tobacco. Importance of  regular dental care.  Discussed the importance of regular aerobic exercise in addition to eating a healthy well balanced diet including minimizing junk food and fast food. Discussed sunscreen use and motor vehicle safety.Continue to follow up for yearly exams. Male: Prostate cancer screening: hx of prostate cancer- follows with urologyColonoscopy over 45: due 2026AAA screening 65-75 if ever smoked: n/aLung cancer screening: 50-80 with 20 year pack history: n/aImmunizationsYearly Flu shot: Recommend yearly in the fallCovid-19 vaccine: Recommend at local pharmacyTetanus: 9/26/2025Shingrix: 04/10/19, 7/22/20Prevnar: 9/26/2025Hepatitis B: 04/17/13, 10/7/14RSV: 75 and older or 60-74 with risk factors: n/a [1] AllergiesAllergen Reactions  Environmental Allergies Other (See Comments)   Pollen - sneezing, hay fever  Poison Ivy Extract Hives [2] Past Medical History:Diagnosis Date  Anxiety   Arthritis   hip  Cancer   Colon polyp   Elevated PSA oct. 2018  Inguinal hernia bilateral, non-recurrent 03/06/2016  Left cervical radiculopathy 12/29/2019  Osteoarthritis of right hip 08/25/2019  Rib fracture 2023  left side  S/P left inguinal hernia repair 02/11/2019  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 [3] Past Surgical History:Procedure Laterality Date  INGUINAL HERNIA REPAIR    JOINT REPLACEMENT  09/2019  Rt hip  KNEE SURGERY Bilateral   arthroscopy ACL reconstruction  PR ARTHRP ACETBLR/PROX FEM PROSTC AGRFT/ALGRFT Right 09/24/2019  Procedure: RIGHT ARTHROPLASTY, HIP, TOTAL ;  Surgeon: Lajuanda Norleen MATSU, MD;  Location: [facility];  Service: Orthopedics  PR ARTHRP KNE CONDYLE&PLATU MEDIAL&LAT COMPARTMENTS Left 01/07/2023  Procedure: LEFT ARTHROPLASTY, KNEE, TOTAL;  Surgeon: Lajuanda Norleen MATSU, MD;  Location: [facility];  Service: Orthopedics  PR LAPAROSCOPY SURG RPR INITIAL INGUINAL HERNIA Bilateral 10/29/2016  Procedure:   LAPAROSCOPIC BILATERAL  INGUINAL HERNIA REPAIR with mesh;  Surgeon: Geofm Alm BRAVO, MD;  Location: [facility];  Service:  General  PR LAPAROSCOPY SURG RPR INITIAL INGUINAL HERNIA Left 01/29/2019  Procedure: LAPAROSCOPIC REPAIR WITH MESH, HERNIA, LEFT INGUINAL, ROBOT-ASSISTED;  Surgeon: Geofm Alm BRAVO, MD;  Location: [facility];  Service: General  PR LAPS REPAIR HERNIA EXCEPT INCAL/INGUN REDUCIBLE N/A 10/29/2016  Procedure:  UMBILICAL HERNIA REPAIR;  Surgeon: Geofm Alm BRAVO, MD;  Location: [facility];  Service: General  PROSTATE SURGERY  2019  UMBILICAL HERNIA REPAIR    VASECTOMY    Surgery Of Male Genitalia Vasectomy Conversion Data  [4] Family HistoryProblem Relation Name Age of Onset  Cancer Mother Ashkan Chamberland   Skin Cancer Father    Cancer Brother Kingson Lohmeyer   Thyroid  disease Brother Eliott Amparan   Conversion Other        [id number]^Ojmhz Intestine Cancer^V16.0^Active^  Cancer Maternal Grandfather Chantal Hamilton   Anesthesia problems Neg Hx

## 2024-03-09 ENCOUNTER — Encounter: Payer: Self-pay | Admitting: Urology

## 2024-03-09 ENCOUNTER — Ambulatory Visit: Payer: No Typology Code available for payment source | Admitting: Urology

## 2024-03-09 ENCOUNTER — Ambulatory Visit: Payer: Self-pay | Admitting: Medical

## 2024-03-09 ENCOUNTER — Other Ambulatory Visit: Payer: Self-pay

## 2024-03-09 VITALS — Ht 72.0 in | Wt 155.0 lb

## 2024-03-09 DIAGNOSIS — R3989 Other symptoms and signs involving the genitourinary system: Secondary | ICD-10-CM

## 2024-03-09 DIAGNOSIS — N529 Male erectile dysfunction, unspecified: Secondary | ICD-10-CM

## 2024-03-09 DIAGNOSIS — Z8546 Personal history of malignant neoplasm of prostate: Secondary | ICD-10-CM

## 2024-03-09 LAB — POCT URINALYSIS DIPSTICK
Blood,UA POCT: NEGATIVE
Glucose,UA POCT: NORMAL mg/dL
Ketones,UA POCT: NEGATIVE mg/dL
Leuk Esterase,UA POCT: NEGATIVE
Lot #: 82722002
Nitrite,UA POCT: NEGATIVE
PH,UA POCT: 5 (ref 5–8)
Specific gravity,UA POCT: 1.015 (ref 1.002–1.030)

## 2024-03-09 LAB — POCT BLADDER SCAN PVR: Residual mL: 0

## 2024-03-09 NOTE — Progress Notes (Unsigned)
 Dictated Note

## 2024-03-09 NOTE — Letter (Signed)
 September 29, 2025Kristen Cross, [address] [place], [place] [address]Qjk:  [phone], Cross RDOB:  08-31-1962Unit#:  [id number] CSN:  [id number] Dear Timothy Cross:This is just a brief followup on your patient, Timothy Cross, who as you know has a history of Gleason 3 + 4 and Gleason 3 + 3 prostate cancer that was clinical stage cT1c with a PSA of 4.18 at diagnosis.  He underwent prostate seed implant brachytherapy 6-1/2 years ago with 125 iodine  seeds.  At that time, he had hematuria and we cystoscoped him, seeing nothing abnormal in his bladder, and upper tract imaging was unremarkable.  Today, his dipstick urinalysis is without heme.  He had been having some urinary frequency, but his postvoid residual urine volume today was negligible by ultrasound.  Recent labs include a creatinine of 1.1, testosterone  of 878 which is actually a little bit high, but not much different than it has been, and a PSA of 0.02.Today's exam of node-bearing areas, abdomen, external genitalia, phallus, hernia canals, and rectum was normal with the prostate extremely flat on exam.At this stage we will just get a PSA in 6 months and if it is not rising we will see him a year from now with labs before that visit.  He is in agreement with that plan.  He does take Cialis  both daily for voiding issues and as needed to help with erections and it still does help.he did not want a bladder sedative for his frequency.Again, thank you for allowing us  to be involved in the care of your patient.  We will, of course, keep you informed of all urological followup.Sincerely, Dallas CHRISTELLA Jericho, MD, FACSEMM/MODLDD:  03/09/2024 10:15:36DT:  03/09/2024 16:32:06Job #:  [id number] cc:  Franky Kindred, MD[address], [place] [address]Xmpduzw Vannie, MD Fax: [phone] [address], [place] [address]EXTERNAL RECIPIENT

## 2024-03-10 ENCOUNTER — Encounter: Payer: Self-pay | Admitting: Primary Care

## 2024-03-10 MED ORDER — ATORVASTATIN CALCIUM 10 MG PO TABS *I*
10.0000 mg | ORAL_TABLET | ORAL | Status: AC
Start: 2024-03-10 — End: 2025-03-10

## 2024-03-20 DIAGNOSIS — S42124S Nondisplaced fracture of acromial process, right shoulder, sequela: Secondary | ICD-10-CM | POA: Diagnosis not present

## 2024-03-20 DIAGNOSIS — S42034D Nondisplaced fracture of lateral end of right clavicle, subsequent encounter for fracture with routine healing: Secondary | ICD-10-CM | POA: Diagnosis not present

## 2024-04-02 DIAGNOSIS — S42034D Nondisplaced fracture of lateral end of right clavicle, subsequent encounter for fracture with routine healing: Secondary | ICD-10-CM | POA: Diagnosis not present

## 2024-04-07 DIAGNOSIS — S42034D Nondisplaced fracture of lateral end of right clavicle, subsequent encounter for fracture with routine healing: Secondary | ICD-10-CM | POA: Diagnosis not present

## 2024-04-15 DIAGNOSIS — S42034D Nondisplaced fracture of lateral end of right clavicle, subsequent encounter for fracture with routine healing: Secondary | ICD-10-CM | POA: Diagnosis not present

## 2024-04-21 DIAGNOSIS — S42034D Nondisplaced fracture of lateral end of right clavicle, subsequent encounter for fracture with routine healing: Secondary | ICD-10-CM | POA: Diagnosis not present

## 2024-04-28 DIAGNOSIS — S42034D Nondisplaced fracture of lateral end of right clavicle, subsequent encounter for fracture with routine healing: Secondary | ICD-10-CM | POA: Diagnosis not present

## 2024-04-30 DIAGNOSIS — L918 Other hypertrophic disorders of the skin: Secondary | ICD-10-CM | POA: Diagnosis not present

## 2024-04-30 DIAGNOSIS — L728 Other follicular cysts of the skin and subcutaneous tissue: Secondary | ICD-10-CM | POA: Diagnosis not present

## 2024-04-30 DIAGNOSIS — D171 Benign lipomatous neoplasm of skin and subcutaneous tissue of trunk: Secondary | ICD-10-CM | POA: Diagnosis not present

## 2024-05-04 DIAGNOSIS — R339 Retention of urine, unspecified: Secondary | ICD-10-CM | POA: Diagnosis not present

## 2024-05-04 DIAGNOSIS — R972 Elevated prostate specific antigen [PSA]: Secondary | ICD-10-CM | POA: Diagnosis not present

## 2024-05-05 DIAGNOSIS — S42034D Nondisplaced fracture of lateral end of right clavicle, subsequent encounter for fracture with routine healing: Secondary | ICD-10-CM | POA: Diagnosis not present

## 2024-05-13 DIAGNOSIS — R339 Retention of urine, unspecified: Secondary | ICD-10-CM | POA: Diagnosis not present

## 2025-03-11 ENCOUNTER — Ambulatory Visit: Admitting: Urology

## 7878-02-09 DEATH — deceased
